# Patient Record
Sex: Female | Born: 1935 | Race: Black or African American | Hispanic: No | Marital: Married | Smoking: Never smoker
Health system: Southern US, Community
[De-identification: ages and names within clinical notes are randomized; demographics above are authoritative.]

## PROBLEM LIST (undated history)

## (undated) DIAGNOSIS — M1990 Unspecified osteoarthritis, unspecified site: Secondary | ICD-10-CM

## (undated) DIAGNOSIS — R0600 Dyspnea, unspecified: Secondary | ICD-10-CM

## (undated) DIAGNOSIS — R062 Wheezing: Secondary | ICD-10-CM

## (undated) DIAGNOSIS — G4730 Sleep apnea, unspecified: Secondary | ICD-10-CM

## (undated) DIAGNOSIS — K219 Gastro-esophageal reflux disease without esophagitis: Secondary | ICD-10-CM

## (undated) DIAGNOSIS — I10 Essential (primary) hypertension: Secondary | ICD-10-CM

## (undated) DIAGNOSIS — M7989 Other specified soft tissue disorders: Secondary | ICD-10-CM

## (undated) DIAGNOSIS — E119 Type 2 diabetes mellitus without complications: Secondary | ICD-10-CM

## (undated) DIAGNOSIS — R32 Unspecified urinary incontinence: Secondary | ICD-10-CM

## (undated) HISTORY — PX: CORONARY ANGIOPLASTY: SHX604

## (undated) HISTORY — PX: FOOT SURGERY: SHX648

## (undated) HISTORY — PX: SHOULDER SURGERY: SHX246

## (undated) HISTORY — PX: JOINT REPLACEMENT: SHX530

## (undated) HISTORY — PX: OTHER SURGICAL HISTORY: SHX169

## (undated) HISTORY — PX: ANKLE SURGERY: SHX546

## (undated) HISTORY — PX: CARDIAC CATHETERIZATION: SHX172

## (undated) HISTORY — DX: Type 2 diabetes mellitus without complications: E11.9

## (undated) HISTORY — PX: KNEE SURGERY: SHX244

## (undated) HISTORY — PX: CARPAL TUNNEL RELEASE: SHX101

---

## 2004-03-30 ENCOUNTER — Ambulatory Visit: Payer: Self-pay | Admitting: Unknown Physician Specialty

## 2004-04-23 ENCOUNTER — Other Ambulatory Visit: Payer: Self-pay

## 2004-04-30 ENCOUNTER — Inpatient Hospital Stay: Payer: Self-pay | Admitting: Unknown Physician Specialty

## 2004-07-01 ENCOUNTER — Emergency Department: Payer: Self-pay | Admitting: Unknown Physician Specialty

## 2004-07-03 ENCOUNTER — Ambulatory Visit: Payer: Self-pay | Admitting: Unknown Physician Specialty

## 2005-01-02 ENCOUNTER — Other Ambulatory Visit: Payer: Self-pay

## 2005-01-08 ENCOUNTER — Inpatient Hospital Stay: Payer: Self-pay | Admitting: Unknown Physician Specialty

## 2005-01-11 ENCOUNTER — Encounter: Payer: Self-pay | Admitting: Internal Medicine

## 2005-02-10 ENCOUNTER — Encounter: Payer: Self-pay | Admitting: Internal Medicine

## 2005-03-13 ENCOUNTER — Encounter: Payer: Self-pay | Admitting: Internal Medicine

## 2005-04-12 ENCOUNTER — Encounter: Payer: Self-pay | Admitting: Internal Medicine

## 2005-05-13 ENCOUNTER — Encounter: Payer: Self-pay | Admitting: Internal Medicine

## 2005-06-13 ENCOUNTER — Encounter: Payer: Self-pay | Admitting: Internal Medicine

## 2005-07-11 ENCOUNTER — Encounter: Payer: Self-pay | Admitting: Internal Medicine

## 2005-08-11 ENCOUNTER — Encounter: Payer: Self-pay | Admitting: Internal Medicine

## 2005-09-10 ENCOUNTER — Encounter: Payer: Self-pay | Admitting: Internal Medicine

## 2005-10-11 ENCOUNTER — Encounter: Payer: Self-pay | Admitting: Internal Medicine

## 2005-11-10 ENCOUNTER — Encounter: Payer: Self-pay | Admitting: Internal Medicine

## 2005-12-03 ENCOUNTER — Ambulatory Visit: Payer: Self-pay | Admitting: Internal Medicine

## 2005-12-11 ENCOUNTER — Encounter: Payer: Self-pay | Admitting: Internal Medicine

## 2006-01-11 ENCOUNTER — Encounter: Payer: Self-pay | Admitting: Internal Medicine

## 2006-02-10 ENCOUNTER — Encounter: Payer: Self-pay | Admitting: Internal Medicine

## 2006-03-13 ENCOUNTER — Encounter: Payer: Self-pay | Admitting: Internal Medicine

## 2006-05-09 ENCOUNTER — Other Ambulatory Visit: Payer: Self-pay

## 2006-05-09 ENCOUNTER — Inpatient Hospital Stay: Payer: Self-pay | Admitting: Cardiology

## 2007-05-21 ENCOUNTER — Ambulatory Visit: Payer: Self-pay | Admitting: Internal Medicine

## 2008-07-27 ENCOUNTER — Ambulatory Visit: Payer: Self-pay | Admitting: Internal Medicine

## 2008-10-03 ENCOUNTER — Ambulatory Visit: Payer: Self-pay | Admitting: Internal Medicine

## 2009-02-08 ENCOUNTER — Ambulatory Visit: Payer: Self-pay | Admitting: Specialist

## 2009-03-14 ENCOUNTER — Ambulatory Visit: Payer: Self-pay | Admitting: Specialist

## 2009-08-01 ENCOUNTER — Ambulatory Visit: Payer: Self-pay | Admitting: Internal Medicine

## 2009-12-04 ENCOUNTER — Inpatient Hospital Stay: Payer: Self-pay | Admitting: *Deleted

## 2010-01-25 ENCOUNTER — Ambulatory Visit: Payer: Self-pay | Admitting: Gastroenterology

## 2010-08-10 ENCOUNTER — Ambulatory Visit: Payer: Self-pay | Admitting: Internal Medicine

## 2010-08-14 ENCOUNTER — Ambulatory Visit: Payer: Self-pay | Admitting: Internal Medicine

## 2011-09-03 ENCOUNTER — Ambulatory Visit: Payer: Self-pay | Admitting: Internal Medicine

## 2012-09-08 ENCOUNTER — Ambulatory Visit: Payer: Self-pay | Admitting: Internal Medicine

## 2013-04-20 ENCOUNTER — Ambulatory Visit (INDEPENDENT_AMBULATORY_CARE_PROVIDER_SITE_OTHER): Payer: Medicare (Managed Care) | Admitting: Podiatry

## 2013-04-20 ENCOUNTER — Encounter: Payer: Self-pay | Admitting: Podiatry

## 2013-04-20 VITALS — BP 202/109 | HR 106 | Resp 20 | Ht <= 58 in | Wt 165.0 lb

## 2013-04-20 DIAGNOSIS — E1159 Type 2 diabetes mellitus with other circulatory complications: Secondary | ICD-10-CM

## 2013-04-20 DIAGNOSIS — Q828 Other specified congenital malformations of skin: Secondary | ICD-10-CM

## 2013-04-20 DIAGNOSIS — M79609 Pain in unspecified limb: Secondary | ICD-10-CM

## 2013-04-20 DIAGNOSIS — M2040 Other hammer toe(s) (acquired), unspecified foot: Secondary | ICD-10-CM

## 2013-04-20 DIAGNOSIS — B351 Tinea unguium: Secondary | ICD-10-CM

## 2013-04-20 NOTE — Progress Notes (Signed)
   Subjective:    Patient ID: Sydney Schultz, female    DOB: August 15, 1931, 79 y.o.   MRN: [id number]  HPI Comments: i have a pair of diabetic shoes i need to pick up , toenails      Review of Systems     Objective:   Physical Exam        Assessment & Plan:

## 2013-04-21 NOTE — Progress Notes (Signed)
Subjective:     Patient ID: Sydney Schultz, female   DOB: 11/16/1931, 79 y.o.   MRN: [id number]  HPI patient presents with painful nailbeds 1-5 both feet that she can not cut or take care of her self   Review of Systems     Objective:   Physical Exam Neurovascular status unchanged with health history same and nail disease with thickness 1-5 of both feet    Assessment:     Mycotic nail infection with pain 1-5 both feet    Plan:     Debridement painful nail bed 1-5 both feet with no iatrogenic bleeding noted. Also we dispensed diabetic shoes do to risk factors including reduced circulatory status and long-term diabetes along with digital deformity. They fit well and should help to prevent ulceration

## 2013-07-20 ENCOUNTER — Ambulatory Visit: Payer: Medicare (Managed Care) | Admitting: Podiatry

## 2013-07-30 ENCOUNTER — Ambulatory Visit (INDEPENDENT_AMBULATORY_CARE_PROVIDER_SITE_OTHER): Payer: Medicare (Managed Care) | Admitting: Podiatry

## 2013-07-30 VITALS — BP 175/91 | HR 92 | Resp 16 | Ht <= 58 in | Wt 172.0 lb

## 2013-07-30 DIAGNOSIS — B351 Tinea unguium: Secondary | ICD-10-CM

## 2013-07-30 DIAGNOSIS — M79609 Pain in unspecified limb: Secondary | ICD-10-CM

## 2013-07-30 DIAGNOSIS — M779 Enthesopathy, unspecified: Secondary | ICD-10-CM

## 2013-07-30 NOTE — Progress Notes (Signed)
Subjective:     Patient ID: Sydney Schultz, female   DOB: 17-Apr-1932, 80 y.o.   MRN: [id number]  HPI patient states that her nails are elongated and painful when pressed and she is also concerned about the left foot continuing to flatten and displaced   Review of Systems     Objective:   Physical Exam Neurovascular status unchanged with nail disease and thickness 1-5 both feet that are painful and on the left foot I noted there is significant collapse of the medial longitudinal arch with lateral rotation of the foot with minimal discomfort but patient feels like she shuffles because of the structural position. Right is also deformed but not to the same degree    Assessment:     Nail disease with pain 1-5 both feet and significant tendon dysfunction causing continued structural changes in foot and ankle left    Plan:     Reviewed conditions and debridement nailbeds 1-5 both feet with no bleeding noted I then went ahead and discussed bracing for the left and surgery and patient is going to think about these different options and decide whether she would want to pursue one or just continue to try to walk as best as she can

## 2013-09-29 ENCOUNTER — Ambulatory Visit: Payer: Self-pay | Admitting: Internal Medicine

## 2013-10-29 ENCOUNTER — Ambulatory Visit: Payer: Medicare (Managed Care) | Admitting: Podiatry

## 2013-11-13 ENCOUNTER — Emergency Department: Payer: Self-pay | Admitting: Emergency Medicine

## 2013-11-13 LAB — CBC WITH DIFFERENTIAL/PLATELET
BASOS ABS: 0 10*3/uL (ref 0.0–0.1)
Basophil %: 0.4 %
EOS ABS: 0.2 10*3/uL (ref 0.0–0.7)
EOS PCT: 3.5 %
HCT: 36.9 % (ref 35.0–47.0)
HGB: 11.9 g/dL — ABNORMAL LOW (ref 12.0–16.0)
LYMPHS ABS: 1 10*3/uL (ref 1.0–3.6)
Lymphocyte %: 18.1 %
MCH: 29.1 pg (ref 26.0–34.0)
MCHC: 32.1 g/dL (ref 32.0–36.0)
MCV: 91 fL (ref 80–100)
Monocyte #: 0.7 x10 3/mm (ref 0.2–0.9)
Monocyte %: 12.2 %
NEUTROS PCT: 65.8 %
Neutrophil #: 3.5 10*3/uL (ref 1.4–6.5)
Platelet: 201 10*3/uL (ref 150–440)
RBC: 4.08 10*6/uL (ref 3.80–5.20)
RDW: 14.3 % (ref 11.5–14.5)
WBC: 5.4 10*3/uL (ref 3.6–11.0)

## 2013-11-13 LAB — PROTIME-INR
INR: 1
Prothrombin Time: 12.8 secs (ref 11.5–14.7)

## 2013-11-13 LAB — COMPREHENSIVE METABOLIC PANEL
ALBUMIN: 4.2 g/dL (ref 3.4–5.0)
ALT: 28 U/L (ref 12–78)
AST: 34 U/L (ref 15–37)
Alkaline Phosphatase: 84 U/L
Anion Gap: 10 (ref 7–16)
BILIRUBIN TOTAL: 0.6 mg/dL (ref 0.2–1.0)
BUN: 26 mg/dL — ABNORMAL HIGH (ref 7–18)
CALCIUM: 10.9 mg/dL — AB (ref 8.5–10.1)
CHLORIDE: 102 mmol/L (ref 98–107)
CO2: 28 mmol/L (ref 21–32)
Creatinine: 1.24 mg/dL (ref 0.60–1.30)
EGFR (African American): 48 — ABNORMAL LOW
EGFR (Non-African Amer.): 41 — ABNORMAL LOW
GLUCOSE: 144 mg/dL — AB (ref 65–99)
OSMOLALITY: 287 (ref 275–301)
Potassium: 3.2 mmol/L — ABNORMAL LOW (ref 3.5–5.1)
Sodium: 140 mmol/L (ref 136–145)
Total Protein: 7 g/dL (ref 6.4–8.2)

## 2013-11-13 LAB — URINALYSIS, COMPLETE
BLOOD: NEGATIVE
Bilirubin,UR: NEGATIVE
Glucose,UR: NEGATIVE mg/dL (ref 0–75)
Ketone: NEGATIVE
Nitrite: NEGATIVE
PH: 6 (ref 4.5–8.0)
Protein: 30
Specific Gravity: 1.014 (ref 1.003–1.030)

## 2013-11-13 LAB — TROPONIN I: Troponin-I: <0.02 ng/mL

## 2013-11-13 LAB — APTT: ACTIVATED PTT: 33.7 s (ref 23.6–35.9)

## 2013-11-15 LAB — URINE CULTURE

## 2013-11-30 ENCOUNTER — Ambulatory Visit: Payer: Self-pay | Admitting: Specialist

## 2014-02-04 ENCOUNTER — Emergency Department: Payer: Self-pay | Admitting: Emergency Medicine

## 2014-02-04 LAB — COMPREHENSIVE METABOLIC PANEL
AST: 34 U/L (ref 15–37)
Albumin: 4.2 g/dL (ref 3.4–5.0)
Alkaline Phosphatase: 86 U/L
Anion Gap: 4 — ABNORMAL LOW (ref 7–16)
BILIRUBIN TOTAL: 0.6 mg/dL (ref 0.2–1.0)
BUN: 22 mg/dL — AB (ref 7–18)
CHLORIDE: 102 mmol/L (ref 98–107)
CO2: 33 mmol/L — AB (ref 21–32)
Calcium, Total: 10.6 mg/dL — ABNORMAL HIGH (ref 8.5–10.1)
Creatinine: 1.11 mg/dL (ref 0.60–1.30)
GFR CALC NON AF AMER: 50 — AB
Glucose: 101 mg/dL — ABNORMAL HIGH (ref 65–99)
Osmolality: 281 (ref 275–301)
Potassium: 3.6 mmol/L (ref 3.5–5.1)
SGPT (ALT): 31 U/L
SODIUM: 139 mmol/L (ref 136–145)
Total Protein: 7.8 g/dL (ref 6.4–8.2)

## 2014-02-04 LAB — CBC WITH DIFFERENTIAL/PLATELET
BASOS ABS: 0 10*3/uL (ref 0.0–0.1)
Basophil %: 0.9 %
Eosinophil #: 0.3 10*3/uL (ref 0.0–0.7)
Eosinophil %: 7.1 %
HCT: 37.9 % (ref 35.0–47.0)
HGB: 12.5 g/dL (ref 12.0–16.0)
Lymphocyte #: 1.1 10*3/uL (ref 1.0–3.6)
Lymphocyte %: 28.1 %
MCH: 29.6 pg (ref 26.0–34.0)
MCHC: 32.9 g/dL (ref 32.0–36.0)
MCV: 90 fL (ref 80–100)
Monocyte #: 0.6 x10 3/mm (ref 0.2–0.9)
Monocyte %: 14.9 %
Neutrophil #: 1.9 10*3/uL (ref 1.4–6.5)
Neutrophil %: 49 %
PLATELETS: 214 10*3/uL (ref 150–440)
RBC: 4.21 10*6/uL (ref 3.80–5.20)
RDW: 13.8 % (ref 11.5–14.5)
WBC: 3.9 10*3/uL (ref 3.6–11.0)

## 2014-02-04 LAB — URINALYSIS, COMPLETE
BLOOD: NEGATIVE
Bacteria: NONE SEEN
Bilirubin,UR: NEGATIVE
Glucose,UR: NEGATIVE mg/dL (ref 0–75)
KETONE: NEGATIVE
Leukocyte Esterase: NEGATIVE
Nitrite: NEGATIVE
PH: 7 (ref 4.5–8.0)
Protein: NEGATIVE
SQUAMOUS EPITHELIAL: NONE SEEN
Specific Gravity: 1.005 (ref 1.003–1.030)
WBC UR: <1 /HPF (ref 0–5)

## 2014-02-04 LAB — TROPONIN I: Troponin-I: <0.02 ng/mL

## 2014-04-22 ENCOUNTER — Ambulatory Visit (INDEPENDENT_AMBULATORY_CARE_PROVIDER_SITE_OTHER): Payer: Medicare (Managed Care) | Admitting: Podiatry

## 2014-04-22 ENCOUNTER — Ambulatory Visit: Payer: Medicare (Managed Care) | Admitting: Podiatry

## 2014-04-22 DIAGNOSIS — M79673 Pain in unspecified foot: Secondary | ICD-10-CM

## 2014-04-22 DIAGNOSIS — B351 Tinea unguium: Secondary | ICD-10-CM

## 2014-04-22 DIAGNOSIS — M779 Enthesopathy, unspecified: Secondary | ICD-10-CM

## 2014-04-22 NOTE — Progress Notes (Signed)
Subjective:     Patient ID: Sydney Schultz, female   DOB: December 06, 1931, 80 y.o.   MRN: [id number]  HPI patient states that her nails are elongated and painful when pressed and she is also concerned about the left foot continuing to flatten and displaced   Review of Systems     Objective:   Physical Exam Neurovascular status unchanged with nail disease and thickness 1-5 both feet that are painful and on the left foot I noted there is significant collapse of the medial longitudinal arch with lateral rotation of the foot with minimal discomfort but patient feels like she shuffles because of the structural position. Right is also deformed but not to the same degree    Assessment:     Nail disease with pain 1-5 both feet and significant tendon dysfunction causing continued structural changes in foot and ankle left    Plan:     Reviewed conditions and debridement nailbeds 1-5 both feet with no bleeding noted I then went ahead and discussed bracing for the left and surgery and patient is going to think about these different options and decide whether she would want to pursue one or just continue to try to walk as best as she can

## 2014-07-22 ENCOUNTER — Ambulatory Visit: Payer: Medicare (Managed Care) | Admitting: Podiatry

## 2015-03-02 ENCOUNTER — Ambulatory Visit
Admission: RE | Admit: 2015-03-02 | Discharge: 2015-03-02 | Disposition: A | Discharge status: Home / Self Care | Payer: Medicare Other | Source: Ambulatory Visit | Attending: Internal Medicine | Admitting: Internal Medicine

## 2015-03-02 ENCOUNTER — Other Ambulatory Visit: Payer: Self-pay | Admitting: Internal Medicine

## 2015-03-02 DIAGNOSIS — R0600 Dyspnea, unspecified: Secondary | ICD-10-CM | POA: Diagnosis present

## 2015-03-02 DIAGNOSIS — J811 Chronic pulmonary edema: Secondary | ICD-10-CM | POA: Insufficient documentation

## 2015-07-31 ENCOUNTER — Encounter: Payer: Self-pay | Admitting: Emergency Medicine

## 2015-07-31 ENCOUNTER — Emergency Department: Payer: Medicare Other

## 2015-07-31 ENCOUNTER — Emergency Department
Admission: EM | Admit: 2015-07-31 | Discharge: 2015-07-31 | Disposition: A | Discharge status: Home / Self Care | Payer: Medicare Other | Attending: Emergency Medicine | Admitting: Emergency Medicine

## 2015-07-31 DIAGNOSIS — E119 Type 2 diabetes mellitus without complications: Secondary | ICD-10-CM | POA: Diagnosis not present

## 2015-07-31 DIAGNOSIS — Y9389 Activity, other specified: Secondary | ICD-10-CM | POA: Diagnosis not present

## 2015-07-31 DIAGNOSIS — W1839XA Other fall on same level, initial encounter: Secondary | ICD-10-CM | POA: Diagnosis not present

## 2015-07-31 DIAGNOSIS — S0003XA Contusion of scalp, initial encounter: Secondary | ICD-10-CM | POA: Diagnosis not present

## 2015-07-31 DIAGNOSIS — I10 Essential (primary) hypertension: Secondary | ICD-10-CM | POA: Insufficient documentation

## 2015-07-31 DIAGNOSIS — S0990XA Unspecified injury of head, initial encounter: Secondary | ICD-10-CM

## 2015-07-31 DIAGNOSIS — Y998 Other external cause status: Secondary | ICD-10-CM | POA: Diagnosis not present

## 2015-07-31 DIAGNOSIS — W19XXXA Unspecified fall, initial encounter: Secondary | ICD-10-CM

## 2015-07-31 DIAGNOSIS — Y9289 Other specified places as the place of occurrence of the external cause: Secondary | ICD-10-CM | POA: Insufficient documentation

## 2015-07-31 HISTORY — DX: Essential (primary) hypertension: I10

## 2015-07-31 NOTE — ED Notes (Signed)
Patient transported to CT 

## 2015-07-31 NOTE — ED Provider Notes (Addendum)
[facility] Emergency Department Provider Note  ____________________________________________   I have reviewed the triage vital signs and the nursing notes.   HISTORY  Chief Complaint Head Injury    HPI Sydney Schultz is a 82 y.o. female on Plavix. She states that she was walking around a table when she lost her footing and fell. This is a non-syncopal fall. Patient did not pass out. She is at her baseline, she has no other complaints. She states she has no significant headache. She does have a hematoma to the occiput. No neck pain no focal numbness or weakness and no chest pain shortness of breath dysuria or urinary frequency or other antecedent symptoms.  Past Medical History  Diagnosis Date  . Diabetes (HCC)   . Hypertension     There are no active problems to display for this patient.   Past Surgical History  Procedure Laterality Date  . Knee surgery Bilateral   . Shoulder surgery Right   . Cesarean section      x 2  . Foot surgery Left     Current Outpatient Rx  Name  Route  Sig  Dispense  Refill  . clopidogrel (PLAVIX) 75 MG tablet               . furosemide (LASIX) 40 MG tablet               . isosorbide mononitrate (IMDUR) 60 MG 24 hr tablet               . losartan-hydrochlorothiazide (HYZAAR) 100-25 MG per tablet               . meloxicam (MOBIC) 7.5 MG tablet               . metFORMIN (GLUCOPHAGE) 500 MG tablet               . pantoprazole (PROTONIX) 40 MG tablet               . VYTORIN 10-40 MG per tablet                 Allergies Review of patient's allergies indicates no known allergies.  History reviewed. No pertinent family history.  Social History Social History  Substance Use Topics  . Smoking status: Never Smoker   . Smokeless tobacco: Never Used  . Alcohol Use: No    Review of Systems Constitutional: No fever/chills Eyes: No visual changes. ENT: No sore throat. No stiff  neck no neck pain Cardiovascular: Denies chest pain. Respiratory: Denies shortness of breath. Gastrointestinal:   no vomiting.  No diarrhea.  No constipation. Genitourinary: Negative for dysuria. Musculoskeletal: Negative lower extremity swelling Skin: Negative for rash. Neurological: Negative for headaches, focal weakness or numbness. 10-point ROS otherwise negative.  ____________________________________________   PHYSICAL EXAM:  VITAL SIGNS: ED Triage Vitals  Enc Vitals Group     BP --      Pulse Rate 07/31/15 0251 71     Resp 07/31/15 0251 16     Temp 07/31/15 0251 98 F (36.7 C)     Temp Source 07/31/15 0251 Oral     SpO2 07/31/15 0251 100 %     Weight 07/31/15 0251 177 lb 2 oz (80.343 kg)     Height 07/31/15 0251 4\' 9"  (1.448 m)     Head Cir --      Peak Flow --      Pain Score 07/31/15 0254 5  Pain Loc --      Pain Edu? --      Excl. in GC? --     Constitutional: Alert and oriented. Well appearing and in no acute distress. Smiling and joking with me. States "the only thing that happened as I hit my big head" Eyes: Conjunctivae are normal. PERRL. EOMI. Head: Mild hematoma noted to the occipital region, no skull fracture palpated. Nose: No congestion/rhinnorhea. Mouth/Throat: Mucous membranes are moist.  Oropharynx non-erythematous. Neck: No stridor.   Nontender with no meningismus Cardiovascular: Normal rate, regular rhythm. Grossly normal heart sounds.  Good peripheral circulation. Respiratory: Normal respiratory effort.  No retractions. Lungs CTAB. Abdominal: Soft and nontender. No distention. No guarding no rebound Back:  There is no focal tenderness or step off there is no midline tenderness there are no lesions noted. there is no CVA tenderness Musculoskeletal: No lower extremity tenderness. No joint effusions, no DVT signs strong distal pulses no edema Neurologic:  Normal speech and language. No gross focal neurologic deficits are appreciated.  Skin:  Skin  is warm, dry and intact. No rash noted. Psychiatric: Mood and affect are normal. Speech and behavior are normal.  ____________________________________________   LABS (all labs ordered are listed, but only abnormal results are displayed)  Labs Reviewed - No data to display ____________________________________________  EKG  I personally interpreted any EKGs ordered by me or triage  ____________________________________________  RADIOLOGY  I reviewed any imaging ordered by me or triage that were performed during my shift and, if possible, patient and/or family made aware of any abnormal findings. ____________________________________________   PROCEDURES  Procedure(s) performed: None  Critical Care performed: None  ____________________________________________   INITIAL IMPRESSION / ASSESSMENT AND PLAN / ED COURSE  Pertinent labs & imaging results that were available during my care of the patient were reviewed by me and considered in my medical decision making (see chart for details).  Non-syncopal fall, as patient is on Plavix we'll obtain a CT scan of her head she is neurologically intact  ----------------------------------------- 5:22 AM on 07/31/2015 -----------------------------------------  Remains at her baseline no distress with no evidence of concussion or decompensation CT is negative, no back pain no hip pain, this was a remembered non-syncopal fall ____________________________________________   FINAL CLINICAL IMPRESSION(S) / ED DIAGNOSES  Final diagnoses:  None      This chart was dictated using voice recognition software.  Despite best efforts to proofread,  errors can occur which can change meaning.     Jeanmarie Plant, MD 07/31/15 [id number]  Jeanmarie Plant, MD 07/31/15 [phone]

## 2015-07-31 NOTE — ED Notes (Signed)
Pt denies needs at this time. Call bell at right side, warm blankets provided.

## 2015-07-31 NOTE — ED Notes (Signed)
Pt updated on progress of ct results.

## 2015-07-31 NOTE — ED Notes (Signed)
Pt states she lost her balance falling backward, striking posterior skull. Pt with hematoma noted to posterior skull. Cms intact in all extremities. Pt denies loc.

## 2015-11-29 ENCOUNTER — Emergency Department: Payer: Medicare Other

## 2015-11-29 ENCOUNTER — Emergency Department
Admission: EM | Admit: 2015-11-29 | Discharge: 2015-11-29 | Disposition: A | Discharge status: Home / Self Care | Payer: Medicare Other | Attending: Emergency Medicine | Admitting: Emergency Medicine

## 2015-11-29 DIAGNOSIS — Z7984 Long term (current) use of oral hypoglycemic drugs: Secondary | ICD-10-CM | POA: Insufficient documentation

## 2015-11-29 DIAGNOSIS — Z7982 Long term (current) use of aspirin: Secondary | ICD-10-CM | POA: Diagnosis not present

## 2015-11-29 DIAGNOSIS — R42 Dizziness and giddiness: Secondary | ICD-10-CM | POA: Diagnosis present

## 2015-11-29 DIAGNOSIS — E119 Type 2 diabetes mellitus without complications: Secondary | ICD-10-CM | POA: Insufficient documentation

## 2015-11-29 DIAGNOSIS — I10 Essential (primary) hypertension: Secondary | ICD-10-CM | POA: Diagnosis not present

## 2015-11-29 DIAGNOSIS — R531 Weakness: Secondary | ICD-10-CM

## 2015-11-29 DIAGNOSIS — Z79899 Other long term (current) drug therapy: Secondary | ICD-10-CM | POA: Diagnosis not present

## 2015-11-29 DIAGNOSIS — E860 Dehydration: Secondary | ICD-10-CM

## 2015-11-29 LAB — URINALYSIS COMPLETE WITH MICROSCOPIC (ARMC ONLY)
Bilirubin Urine: NEGATIVE
Glucose, UA: NEGATIVE mg/dL
Hgb urine dipstick: NEGATIVE
Ketones, ur: NEGATIVE mg/dL
Nitrite: NEGATIVE
PH: 6 (ref 5.0–8.0)
PROTEIN: 30 mg/dL — AB
Specific Gravity, Urine: 1.015 (ref 1.005–1.030)

## 2015-11-29 LAB — CBC
HCT: 34.9 % — ABNORMAL LOW (ref 35.0–47.0)
Hemoglobin: 11.9 g/dL — ABNORMAL LOW (ref 12.0–16.0)
MCH: 29.4 pg (ref 26.0–34.0)
MCHC: 34.1 g/dL (ref 32.0–36.0)
MCV: 86.3 fL (ref 80.0–100.0)
PLATELETS: 183 10*3/uL (ref 150–440)
RBC: 4.05 MIL/uL (ref 3.80–5.20)
RDW: 14.2 % (ref 11.5–14.5)
WBC: 5.5 10*3/uL (ref 3.6–11.0)

## 2015-11-29 LAB — COMPREHENSIVE METABOLIC PANEL
ALBUMIN: 3.9 g/dL (ref 3.5–5.0)
ALT: 12 U/L — ABNORMAL LOW (ref 14–54)
AST: 28 U/L (ref 15–41)
Alkaline Phosphatase: 67 U/L (ref 38–126)
Anion gap: 8 (ref 5–15)
BUN: 24 mg/dL — AB (ref 6–20)
CALCIUM: 10.2 mg/dL (ref 8.9–10.3)
CO2: 30 mmol/L (ref 22–32)
CREATININE: 1.5 mg/dL — AB (ref 0.44–1.00)
Chloride: 97 mmol/L — ABNORMAL LOW (ref 101–111)
GFR calc Af Amer: 36 mL/min — ABNORMAL LOW (ref >60)
GFR calc non Af Amer: 31 mL/min — ABNORMAL LOW (ref >60)
GLUCOSE: 147 mg/dL — AB (ref 65–99)
Potassium: 3.1 mmol/L — ABNORMAL LOW (ref 3.5–5.1)
SODIUM: 135 mmol/L (ref 135–145)
Total Bilirubin: 0.5 mg/dL (ref 0.3–1.2)
Total Protein: 6.7 g/dL (ref 6.5–8.1)

## 2015-11-29 LAB — GLUCOSE, CAPILLARY: GLUCOSE-CAPILLARY: 157 mg/dL — AB (ref 65–99)

## 2015-11-29 LAB — TROPONIN I: Troponin I: <0.03 ng/mL (ref <0.03)

## 2015-11-29 MED ORDER — POTASSIUM CHLORIDE 20 MEQ PO PACK: 40.0000 meq | PACK | Freq: Two times a day (BID) | ORAL | Status: DC

## 2015-11-29 MED ORDER — POTASSIUM CHLORIDE 20 MEQ PO PACK
40.0000 meq | PACK | Freq: Once | ORAL | Status: AC
  Administered 2015-11-29: 40 meq via ORAL
  Filled 2015-11-29: qty 2

## 2015-11-29 NOTE — ED Notes (Signed)
MD at bedside. 

## 2015-11-29 NOTE — ED Notes (Signed)
Pt transported to xray via stretcher

## 2015-11-29 NOTE — ED Notes (Signed)
Pt presents to ED via [facility]. EMS got call for sudden onset of weakness and lethargy. Stated that pt was in a chair saying "I'm sick". Initial BP with EMS 73/40, gave normal saline, BP up to 146/72. EMS reports that pt is much more alert and less lethargic upon arrival to ED. Pt awake and alert.

## 2015-11-29 NOTE — ED Provider Notes (Signed)
[facility] Emergency Department Provider Note   ____________________________________________  Time seen: Approximately 2:19 AM  I have reviewed the triage vital signs and the nursing notes.   HISTORY  Chief Complaint Hypotension and Fatigue    HPI Sydney Schultz is a 82 y.o. female who is brought in by ambulance for low blood pressure and weakness. The patient reports that things weren't going right tonight with her breathing. She reports that her breathing was not right but cannot explain exactly what was going on. The patient denies any chest pain denies dizziness denies abdominal pain denies vomiting. She reports that she is feeling weak all over. It started right before she called EMS. According to EMS she had some sudden onset weakness and lethargy. When EMS checked her blood pressure they report that was 73/40. She was given 250 ML's of normal saline and her blood pressure came up to 146/22. The patient says she has been eating and drinking well and has had no problems with urination. The patient reports that she was just not feeling well so she is here for evaluation.   Past Medical History  Diagnosis Date  . Diabetes (HCC)   . Hypertension     There are no active problems to display for this patient.   Past Surgical History  Procedure Laterality Date  . Knee surgery Bilateral   . Shoulder surgery Right   . Cesarean section      x 2  . Foot surgery Left     Current Outpatient Rx  Name  Route  Sig  Dispense  Refill  . aspirin EC 81 MG tablet   Oral   Take 81 mg by mouth daily.         . calcium-vitamin D (OSCAL WITH D) 500-200 MG-UNIT tablet   Oral   Take 1 tablet by mouth daily with breakfast.         . clopidogrel (PLAVIX) 75 MG tablet   Oral   Take 75 mg by mouth daily.          . ferrous sulfate 325 (65 FE) MG tablet   Oral   Take 325 mg by mouth daily with breakfast.         . furosemide (LASIX) 40 MG tablet  Oral   Take 20-40 mg by mouth daily as needed for edema.          Marland Kitchen losartan-hydrochlorothiazide (HYZAAR) 100-25 MG per tablet   Oral   Take 1 tablet by mouth daily.          . meloxicam (MOBIC) 7.5 MG tablet   Oral   Take 7.5 mg by mouth daily.          . metFORMIN (GLUCOPHAGE) 500 MG tablet   Oral   Take 750 mg by mouth daily with breakfast.          . oxybutynin (DITROPAN) 5 MG tablet   Oral   Take 5 mg by mouth 2 (two) times daily.         . pantoprazole (PROTONIX) 40 MG tablet   Oral   Take 40 mg by mouth daily.         . Potassium Gluconate 550 MG TABS   Oral   Take 1 tablet by mouth daily.         . vitamin E 400 UNIT capsule   Oral   Take 400 Units by mouth daily.         Marland Kitchen  VYTORIN 10-40 MG per tablet   Oral   Take 1 tablet by mouth daily at 6 PM.            Allergies Review of patient's allergies indicates no known allergies.  History reviewed. No pertinent family history.  Social History Social History  Substance Use Topics  . Smoking status: Never Smoker   . Smokeless tobacco: Never Used  . Alcohol Use: No    Review of Systems Constitutional: No fever/chills Eyes: No visual changes. ENT: No sore throat. Cardiovascular: Denies chest pain. Respiratory: Difficulty breathing Gastrointestinal: No abdominal pain.  No nausea, no vomiting.  No diarrhea.  No constipation. Genitourinary: Negative for dysuria. Musculoskeletal: Negative for back pain. Skin: Negative for rash. Neurological: Denies weakness  10-point ROS otherwise negative.  ____________________________________________   PHYSICAL EXAM:  VITAL SIGNS: ED Triage Vitals  Enc Vitals Group     BP 11/29/15 0217 126/99 mmHg     Pulse Rate 11/29/15 0217 74     Resp 11/29/15 0217 14     Temp 11/29/15 0217 98.5 F (36.9 C)     Temp src --      SpO2 11/29/15 0217 99 %     Weight --      Height --      Head Cir --      Peak Flow --      Pain Score 11/29/15 0219 0      Pain Loc --      Pain Edu? --      Excl. in GC? --     Constitutional: Alert and oriented. Well appearing and in no acute distress. Eyes: Conjunctivae are normal. PERRL. EOMI. Head: Atraumatic. Nose: No congestion/rhinnorhea. Mouth/Throat: Mucous membranes are moist.  Oropharynx non-erythematous. Cardiovascular: Normal rate, regular rhythm. Grossly normal heart sounds.  Good peripheral circulation. Respiratory: Normal respiratory effort.  No retractions. Lungs CTAB. Gastrointestinal: Soft and nontender. No distention. Positive bowel sounds Musculoskeletal: No lower extremity tenderness nor edema.   Neurologic:  Normal speech and language.  Illness 2 through 12 are grossly intact with no focal motor or neuro deficits Skin:  Skin is warm, dry and intact.  Psychiatric: Mood and affect are normal.   ____________________________________________   LABS (all labs ordered are listed, but only abnormal results are displayed)  Labs Reviewed  GLUCOSE, CAPILLARY - Abnormal; Notable for the following:    Glucose-Capillary 157 (*)    All other components within normal limits  CBC - Abnormal; Notable for the following:    Hemoglobin 11.9 (*)    HCT 34.9 (*)    All other components within normal limits  COMPREHENSIVE METABOLIC PANEL - Abnormal; Notable for the following:    Potassium 3.1 (*)    Chloride 97 (*)    Glucose, Bld 147 (*)    BUN 24 (*)    Creatinine, Ser 1.50 (*)    ALT 12 (*)    GFR calc non Af Amer 31 (*)    GFR calc Af Amer 36 (*)    All other components within normal limits  URINALYSIS COMPLETEWITH MICROSCOPIC ([facility] ONLY) - Abnormal; Notable for the following:    Color, Urine YELLOW (*)    APPearance CLEAR (*)    Protein, ur 30 (*)    Leukocytes, UA 2+ (*)    Bacteria, UA RARE (*)    Squamous Epithelial / LPF 0-5 (*)    All other components within normal limits  TROPONIN I   ____________________________________________  EKG  ED  ECG REPORT I, Rebecka ApleyWebster,   Son Barkan P, the attending physician, personally viewed and interpreted this ECG.   Date: 11/29/2015  EKG Time: 2188  Rate: 82  Rhythm: normal sinus rhythm  Axis: Normal  Intervals:none  ST&T Change: normal  ____________________________________________  RADIOLOGY  CT head: No acute finding or change, Moderate chronic microvascular disease  CXR: No active cardiopulmonary disease ____________________________________________   PROCEDURES  Procedure(s) performed: None  Procedures  Critical Care performed: No  ____________________________________________   INITIAL IMPRESSION / ASSESSMENT AND PLAN / ED COURSE  Pertinent labs & imaging results that were available during my care of the patient were reviewed by me and considered in my medical decision making (see chart for details).  This is an 82 year old female who is here in the emergency department tonight as she was feeling weak and having some difficulty breathing. Per EMS the patient's blood pressure was low but improved with 250 ML's of normal saline. I will check some blood work on the patient as well as orthostatics and reassess the patient.  I went back to reassess the patient. Family was in the room at the time. They report that the patient was sweaty and a little bit shaky and she was not speaking very clearly when she started feeling her symptoms. The patient's daughter had given her some orange juice that she was unsure of her blood sugar was low. The patient's son-in-law reports that she typically has low blood sugar or dehydration whenever she's had these episodes. I informed him that while her blood sugar was normal it could've been low-income cause her symptoms. The patient reports that she feels well right now and she is laughing and joking with the family. The patient was not orthostatic. I encouraged her to drink as well as to watch her blood sugars and the patient should follow-up with her primary care physician. The  family's questions were answered at this time they had no further complaints or concerns. The patient will be discharged home. ____________________________________________   FINAL CLINICAL IMPRESSION(S) / ED DIAGNOSES  Final diagnoses:  Weakness  Dehydration  Dizziness      NEW MEDICATIONS STARTED DURING THIS VISIT:  New Prescriptions   No medications on file     Note:  This document was prepared using Dragon voice recognition software and may include unintentional dictation errors.    Rebecka ApleyAllison P Nazaire Cordial, MD 11/29/15 [phone]

## 2015-11-29 NOTE — Discharge Instructions (Signed)
Dehydration, Adult Dehydration is a condition in which you do not have enough fluid or water in your body. It happens when you take in less fluid than you lose. Vital organs such as the kidneys, brain, and heart cannot function without a proper amount of fluids. Any loss of fluids from the body can cause dehydration.  Dehydration can range from mild to severe. This condition should be treated right away to help prevent it from becoming severe. CAUSES  This condition may be caused by:  Vomiting.  Diarrhea.  Excessive sweating, such as when exercising in hot or humid weather.  Not drinking enough fluid during strenuous exercise or during an illness.  Excessive urine output.  Fever.  Certain medicines. RISK FACTORS This condition is more likely to develop in:  People who are taking certain medicines that cause the body to lose excess fluid (diuretics).   People who have a chronic illness, such as diabetes, that may increase urination.  Older adults.   People who live at high altitudes.   People who participate in endurance sports.  SYMPTOMS  Mild Dehydration  Thirst.  Dry lips.  Slightly dry mouth.  Dry, warm skin. Moderate Dehydration  Very dry mouth.   Muscle cramps.   Dark urine and decreased urine production.   Decreased tear production.   Headache.   Light-headedness, especially when you stand up from a sitting position.  Severe Dehydration  Changes in skin.   Cold and clammy skin.   Skin does not spring back quickly when lightly pinched and released.   Changes in body fluids.   Extreme thirst.   No tears.   Not able to sweat when body temperature is high, such as in hot weather.   Minimal urine production.   Changes in vital signs.   Rapid, weak pulse (more than 100 beats per minute when you are sitting still).   Rapid breathing.   Low blood pressure.   Other changes.   Sunken eyes.   Cold hands and feet.    Confusion.  Lethargy and difficulty being awakened.  Fainting (syncope).   Short-term weight loss.   Unconsciousness. DIAGNOSIS  This condition may be diagnosed based on your symptoms. You may also have tests to determine how severe your dehydration is. These tests may include:   Urine tests.   Blood tests.  TREATMENT  Treatment for this condition depends on the severity. Mild or moderate dehydration can often be treated at home. Treatment should be started right away. Do not wait until dehydration becomes severe. Severe dehydration needs to be treated at the hospital. Treatment for Mild Dehydration  Drinking plenty of water to replace the fluid you have lost.   Replacing minerals in your blood (electrolytes) that you may have lost.  Treatment for Moderate Dehydration  Consuming oral rehydration solution (ORS). Treatment for Severe Dehydration  Receiving fluid through an IV tube.   Receiving electrolyte solution through a feeding tube that is passed through your nose and into your stomach (nasogastric tube or NG tube).  Correcting any abnormalities in electrolytes. HOME CARE INSTRUCTIONS   Drink enough fluid to keep your urine clear or pale yellow.   Drink water or fluid slowly by taking small sips. You can also try sucking on ice cubes.  Have food or beverages that contain electrolytes. Examples include bananas and sports drinks.  Take over-the-counter and prescription medicines only as told by your health care provider.   Prepare ORS according to the manufacturer's instructions. Take sips  of ORS every 5 minutes until your urine returns to normal.  If you have vomiting or diarrhea, continue to try to drink water, ORS, or both.   If you have diarrhea, avoid:   Beverages that contain caffeine.   Fruit juice.   Milk.   Carbonated soft drinks.  Do not take salt tablets. This can lead to the condition of having too much sodium in your body  (hypernatremia).  SEEK MEDICAL CARE IF:  You cannot eat or drink without vomiting.  You have had moderate diarrhea during a period of more than 24 hours.  You have a fever. SEEK IMMEDIATE MEDICAL CARE IF:   You have extreme thirst.  You have severe diarrhea.  You have not urinated in 6-8 hours, or you have urinated only a small amount of very dark urine.  You have shriveled skin.  You are dizzy, confused, or both.   This information is not intended to replace advice given to you by your health care provider. Make sure you discuss any questions you have with your health care provider.   Document Released: 04/29/2005 Document Revised: 01/18/2015 Document Reviewed: 09/14/2014 Elsevier Interactive Patient Education 2016 [employer]  Weakness Weakness is a lack of strength. It may be felt all over the body (generalized) or in one specific part of the body (focal). Some causes of weakness can be serious. You may need further medical evaluation, especially if you are elderly or you have a history of immunosuppression (such as chemotherapy or HIV), kidney disease, heart disease, or diabetes. CAUSES  Weakness can be caused by many different things, including:  Infection.  Physical exhaustion.  Internal bleeding or other blood loss that results in a lack of red blood cells (anemia).  Dehydration. This cause is more common in elderly people.  Side effects or electrolyte abnormalities from medicines, such as pain medicines or sedatives.  Emotional distress, anxiety, or depression.  Circulation problems, especially severe peripheral arterial disease.  Heart disease, such as rapid atrial fibrillation, bradycardia, or heart failure.  Nervous system disorders, such as Guillain-Barr syndrome, multiple sclerosis, or stroke. DIAGNOSIS  To find the cause of your weakness, your caregiver will take your history and perform a physical exam. Lab tests or X-rays may also be ordered, if  needed. TREATMENT  Treatment of weakness depends on the cause of your symptoms and can vary greatly. HOME CARE INSTRUCTIONS   Rest as needed.  Eat a well-balanced diet.  Try to get some exercise every day.  Only take over-the-counter or prescription medicines as directed by your caregiver. SEEK MEDICAL CARE IF:   Your weakness seems to be getting worse or spreads to other parts of your body.  You develop new aches or pains. SEEK IMMEDIATE MEDICAL CARE IF:   You cannot perform your normal daily activities, such as getting dressed and feeding yourself.  You cannot walk up and down stairs, or you feel exhausted when you do so.  You have shortness of breath or chest pain.  You have difficulty moving parts of your body.  You have weakness in only one area of the body or on only one side of the body.  You have a fever.  You have trouble speaking or swallowing.  You cannot control your bladder or bowel movements.  You have black or bloody vomit or stools. MAKE SURE YOU:  Understand these instructions.  Will watch your condition.  Will get help right away if you are not doing well or get worse.  This information is not intended to replace advice given to you by your health care provider. Make sure you discuss any questions you have with your health care provider.   Document Released: 04/29/2005 Document Revised: 10/29/2011 Document Reviewed: 06/28/2011 Elsevier Interactive Patient Education 2016 [employer]  Rehydration, Elderly Rehydration is the replacement of body fluids lost during dehydration. Dehydration is an extreme loss of body fluids to the point of body function impairment. There are many ways extreme fluid loss can occur, including vomiting, diarrhea, or excess sweating. Recovering from dehydration requires replacing lost fluids, continuing to eat to maintain strength, and avoiding foods and beverages that may contribute to further fluid loss or may increase  nausea. It is especially important for older adults to stay hydrated because dehydration can lead to dizziness and falls. Some factors that can contribute to dehydration are more common in the elderly, including the use of prescription medicines and a decreased sensation of thirst.  HOW TO REHYDRATE In most cases, rehydration involves the replacement of not only fluids but also carbohydrates and basic body salts. Rehydration with an oral rehydration solution is one way to replace essential nutrients lost through dehydration. An oral rehydration solution can be purchased at pharmacies, retail stores, and online. Premixed packets of powder that you combine with water to make a solution are also sold. You can prepare an oral rehydration solution at home by mixing the following ingredients together:    - tsp table salt.   tsp baking soda.   tsp salt substitute containing potassium chloride.  1 tablespoons sugar.  1 L (34 oz) of water. Be sure to use exact measurements. Including too much sugar can make diarrhea worse. Drink -1 cup (120-240 mL) of oral rehydration solution each time you have diarrhea or vomit. If drinking this amount makes your vomiting worse, try drinking smaller amounts more often. For example, drink 1-3 tsp every 5-10 minutes.  A general rule for staying hydrated is to drink 1-2 L of fluid per day. Talk to your caregiver about the specific amount you should be drinking each day. Drink enough fluids to keep your urine clear or pale yellow. EATING WHEN DEHYDRATED  Even if you have had severe sweating or you are having diarrhea, do not stop eating. Many healthy items in a normal diet are okay to continue eating while recovering from dehydration. The following tips can help you to lessen nausea when you eat:  Ask someone else to prepare your food. Cooking smells may worsen nausea.  Eat in a well-ventilated room away from cooking smells.  Sit up when you eat. Avoid lying down  until 1-2 hours after eating.  Eat small amounts when you eat.  Eat foods that are easy to digest. These include soft, well-cooked, or mashed foods. FOODS AND BEVERAGES TO AVOID Avoid eating or drinking the following foods and beverages that may increase nausea or further loss of fluid:   Fruit juices with a high sugar content, such as concentrated juices.  Alcohol.  Beverages containing caffeine.  Carbonated drinks. They may cause a lot of gas.  Foods that may cause a lot of gas, such as cabbage, broccoli, and beans.  Fatty, greasy, and fried foods.  Spicy, very salty, and very sweet foods or drinks.  Foods or drinks that are very hot or very cold. Consume food or drinks at or near room temperature.  Foods that need a lot of chewing, such as raw vegetables.  Foods that are sticky or hard to  swallow, such as peanut butter.   This information is not intended to replace advice given to you by your health care provider. Make sure you discuss any questions you have with your health care provider.   Document Released: 07/22/2011 Document Revised: 01/22/2012 Document Reviewed: 07/22/2011 Elsevier Interactive Patient Education [employer]

## 2016-05-23 ENCOUNTER — Other Ambulatory Visit: Payer: Self-pay | Admitting: Gastroenterology

## 2016-05-23 DIAGNOSIS — R197 Diarrhea, unspecified: Secondary | ICD-10-CM

## 2016-06-18 ENCOUNTER — Ambulatory Visit
Admission: RE | Admit: 2016-06-18 | Discharge: 2016-06-18 | Disposition: A | Discharge status: Home / Self Care | Payer: Medicare Other | Source: Ambulatory Visit | Attending: Gastroenterology | Admitting: Gastroenterology

## 2016-06-18 DIAGNOSIS — I348 Other nonrheumatic mitral valve disorders: Secondary | ICD-10-CM | POA: Diagnosis not present

## 2016-06-18 DIAGNOSIS — M47896 Other spondylosis, lumbar region: Secondary | ICD-10-CM | POA: Diagnosis not present

## 2016-06-18 DIAGNOSIS — K8020 Calculus of gallbladder without cholecystitis without obstruction: Secondary | ICD-10-CM | POA: Diagnosis not present

## 2016-06-18 DIAGNOSIS — R197 Diarrhea, unspecified: Secondary | ICD-10-CM | POA: Insufficient documentation

## 2016-06-18 MED ORDER — IOPAMIDOL (ISOVUE-300) INJECTION 61%
100.0000 mL | Freq: Once | INTRAVENOUS | Status: AC | PRN
  Administered 2016-06-18: 100 mL via INTRAVENOUS

## 2016-11-23 ENCOUNTER — Emergency Department: Payer: Medicare Other

## 2016-11-23 ENCOUNTER — Emergency Department
Admission: EM | Admit: 2016-11-23 | Discharge: 2016-11-24 | Disposition: A | Discharge status: Home / Self Care | Payer: Medicare Other | Attending: Emergency Medicine | Admitting: Emergency Medicine

## 2016-11-23 ENCOUNTER — Encounter: Payer: Self-pay | Admitting: Emergency Medicine

## 2016-11-23 DIAGNOSIS — I10 Essential (primary) hypertension: Secondary | ICD-10-CM | POA: Diagnosis not present

## 2016-11-23 DIAGNOSIS — E119 Type 2 diabetes mellitus without complications: Secondary | ICD-10-CM | POA: Insufficient documentation

## 2016-11-23 DIAGNOSIS — L03119 Cellulitis of unspecified part of limb: Secondary | ICD-10-CM | POA: Insufficient documentation

## 2016-11-23 DIAGNOSIS — R609 Edema, unspecified: Secondary | ICD-10-CM | POA: Diagnosis not present

## 2016-11-23 DIAGNOSIS — R21 Rash and other nonspecific skin eruption: Secondary | ICD-10-CM | POA: Diagnosis present

## 2016-11-23 LAB — URINALYSIS, COMPLETE (UACMP) WITH MICROSCOPIC
BACTERIA UA: NONE SEEN
BILIRUBIN URINE: NEGATIVE
GLUCOSE, UA: NEGATIVE mg/dL
Hgb urine dipstick: NEGATIVE
KETONES UR: NEGATIVE mg/dL
LEUKOCYTES UA: NEGATIVE
NITRITE: NEGATIVE
PH: 6 (ref 5.0–8.0)
PROTEIN: NEGATIVE mg/dL
Specific Gravity, Urine: 1.009 (ref 1.005–1.030)
Squamous Epithelial / LPF: NONE SEEN

## 2016-11-23 LAB — COMPREHENSIVE METABOLIC PANEL
ALBUMIN: 4.4 g/dL (ref 3.5–5.0)
ALT: 13 U/L — ABNORMAL LOW (ref 14–54)
ANION GAP: 11 (ref 5–15)
AST: 28 U/L (ref 15–41)
Alkaline Phosphatase: 70 U/L (ref 38–126)
BILIRUBIN TOTAL: 0.7 mg/dL (ref 0.3–1.2)
BUN: 19 mg/dL (ref 6–20)
CO2: 28 mmol/L (ref 22–32)
Calcium: 10.7 mg/dL — ABNORMAL HIGH (ref 8.9–10.3)
Chloride: 99 mmol/L — ABNORMAL LOW (ref 101–111)
Creatinine, Ser: 1.04 mg/dL — ABNORMAL HIGH (ref 0.44–1.00)
GFR calc Af Amer: 56 mL/min — ABNORMAL LOW (ref >60)
GFR calc non Af Amer: 49 mL/min — ABNORMAL LOW (ref >60)
GLUCOSE: 123 mg/dL — AB (ref 65–99)
Potassium: 3.4 mmol/L — ABNORMAL LOW (ref 3.5–5.1)
SODIUM: 138 mmol/L (ref 135–145)
TOTAL PROTEIN: 7.4 g/dL (ref 6.5–8.1)

## 2016-11-23 LAB — CBC WITH DIFFERENTIAL/PLATELET
BASOS ABS: 0 10*3/uL (ref 0–0.1)
BASOS PCT: 1 %
EOS ABS: 0.3 10*3/uL (ref 0–0.7)
EOS PCT: 8 %
HEMATOCRIT: 35.5 % (ref 35.0–47.0)
Hemoglobin: 11.9 g/dL — ABNORMAL LOW (ref 12.0–16.0)
Lymphocytes Relative: 30 %
Lymphs Abs: 0.9 10*3/uL — ABNORMAL LOW (ref 1.0–3.6)
MCH: 30.1 pg (ref 26.0–34.0)
MCHC: 33.6 g/dL (ref 32.0–36.0)
MCV: 89.5 fL (ref 80.0–100.0)
MONO ABS: 0.5 10*3/uL (ref 0.2–0.9)
MONOS PCT: 17 %
Neutro Abs: 1.4 10*3/uL (ref 1.4–6.5)
Neutrophils Relative %: 44 %
PLATELETS: 193 10*3/uL (ref 150–440)
RBC: 3.97 MIL/uL (ref 3.80–5.20)
RDW: 13.8 % (ref 11.5–14.5)
WBC: 3.1 10*3/uL — ABNORMAL LOW (ref 3.6–11.0)

## 2016-11-23 LAB — LACTIC ACID, PLASMA
Lactic Acid, Venous: 2.1 mmol/L (ref 0.5–1.9)
Lactic Acid, Venous: 1.2 mmol/L (ref 0.5–1.9)

## 2016-11-23 LAB — BRAIN NATRIURETIC PEPTIDE: B NATRIURETIC PEPTIDE 5: 62 pg/mL (ref 0.0–100.0)

## 2016-11-23 LAB — TROPONIN I: Troponin I: <0.03 ng/mL (ref <0.03)

## 2016-11-23 MED ORDER — VANCOMYCIN HCL IN DEXTROSE 1-5 GM/200ML-% IV SOLN
1000.0000 mg | Freq: Once | INTRAVENOUS | Status: AC
  Administered 2016-11-23: 1000 mg via INTRAVENOUS
  Filled 2016-11-23: qty 200

## 2016-11-23 MED ORDER — CEPHALEXIN 250 MG PO CAPS: 250.0000 mg | ORAL_CAPSULE | Freq: Four times a day (QID) | ORAL | 0 refills | Status: AC

## 2016-11-23 NOTE — ED Triage Notes (Signed)
Pt with blisters noted to bilateral lower legs. Pt also complains of bilateral foot swelling. Pt denies fever, pain, itching. Pt appears in no acute distress. No resp distress noted. Pt denies known insect bite. No weeping or open blisters noted, blisters are closed and have serous fluid.

## 2016-11-23 NOTE — ED Notes (Signed)
One set of blood cultures obtained in triage.  

## 2016-11-23 NOTE — ED Provider Notes (Addendum)
[facility] Emergency Department Provider Note       Time seen: ----------------------------------------- 8:34 PM on 11/23/2016 -----------------------------------------     I have reviewed the triage vital signs and the nursing notes.   HISTORY   Chief Complaint Foot Swelling and Blister    HPI Sydney Schultz is a 83 y.o. female who presents to the ED for blisters noted to bilateral lower legs. Patient also complains of bilateral foot swelling. She denies fever, pain but has had some itching in her arms. She denies fevers, chills, chest pain, shortness of breath, vomiting but has had chronic diarrhea. She has never had swelling like this before.   Past Medical History:  Diagnosis Date  . Diabetes (HCC)   . Hypertension     There are no active problems to display for this patient.   Past Surgical History:  Procedure Laterality Date  . CESAREAN SECTION     x 2  . FOOT SURGERY Left   . KNEE SURGERY Bilateral   . SHOULDER SURGERY Right     Allergies Patient has no known allergies.  Social History Social History  Substance Use Topics  . Smoking status: Never Smoker  . Smokeless tobacco: Never Used  . Alcohol use No    Review of Systems Constitutional: Negative for fever. Cardiovascular: Negative for chest pain. Respiratory: Negative for shortness of breath. Gastrointestinal: Negative for abdominal pain, vomiting and diarrhea. Genitourinary: Negative for dysuria. Musculoskeletal: Negative for back pain.Positive for peripheral edema Skin: Negative for rash. Neurological: Negative for headaches, focal weakness or numbness.  All systems negative/normal/unremarkable except as stated in the HPI  ____________________________________________   PHYSICAL EXAM:  VITAL SIGNS: ED Triage Vitals [11/23/16 1901]  Enc Vitals Group     BP (!) 157/61     Pulse Rate 80     Resp 16     Temp 98.7 F (37.1 C)     Temp Source Oral     SpO2  100 %     Weight 157 lb (71.2 kg)     Height 5\' 1"  (1.549 m)     Head Circumference      Peak Flow      Pain Score      Pain Loc      Pain Edu?      Excl. in GC?     Constitutional: Alert and oriented. Well appearing and in no distress. Eyes: Conjunctivae are normal. Normal extraocular movements. ENT   Head: Normocephalic and atraumatic.   Nose: No congestion/rhinnorhea.   Mouth/Throat: Mucous membranes are moist.   Neck: No stridor. Cardiovascular: Normal rate, regular rhythm. No murmurs, rubs, or gallops. Respiratory: Normal respiratory effort without tachypnea nor retractions. Breath sounds are clear and equal bilaterally. No wheezes/rales/rhonchi. Gastrointestinal: Soft and nontender. Normal bowel sounds Musculoskeletal: Nontender with normal range of motion in extremities. Mild pitting edema to mid tibia bilaterally. There are vesicular lesions on the left leg. There is a plantar wound on the right midfoot with surrounding erythema Neurologic:  Normal speech and language. No gross focal neurologic deficits are appreciated.  Skin:  Erythema on the bottom of the right foot, blisters are noted in the left lower extremity Psychiatric: Mood and affect are normal. Speech and behavior are normal.  ____________________________________________  ED COURSE:  Pertinent labs & imaging results that were available during my care of the patient were reviewed by me and considered in my medical decision making (see chart for details). Patient presents for peripheral edema with  blister formation, we will assess with labs and imaging as indicated.   Procedures ____________________________________________   LABS (pertinent positives/negatives)  Labs Reviewed  CBC WITH DIFFERENTIAL/PLATELET - Abnormal; Notable for the following:       Result Value   WBC 3.1 (*)    Hemoglobin 11.9 (*)    Lymphs Abs 0.9 (*)    All other components within normal limits  COMPREHENSIVE METABOLIC  PANEL - Abnormal; Notable for the following:    Potassium 3.4 (*)    Chloride 99 (*)    Glucose, Bld 123 (*)    Creatinine, Ser 1.04 (*)    Calcium 10.7 (*)    ALT 13 (*)    GFR calc non Af Amer 49 (*)    GFR calc Af Amer 56 (*)    All other components within normal limits  LACTIC ACID, PLASMA - Abnormal; Notable for the following:    Lactic Acid, Venous 2.1 (*)    All other components within normal limits  URINALYSIS, COMPLETE (UACMP) WITH MICROSCOPIC - Abnormal; Notable for the following:    Color, Urine YELLOW (*)    APPearance CLEAR (*)    All other components within normal limits  CULTURE, BLOOD (ROUTINE X 2)  CULTURE, BLOOD (ROUTINE X 2)  TROPONIN I  BRAIN NATRIURETIC PEPTIDE  LACTIC ACID, PLASMA    RADIOLOGY Images were viewed by me  US venous  IMPRESSION: No evidence of DVT within either lower extremity. Right foot x-ray IMPRESSION: 1. No definite acute osseous abnormality 2. Pes planus deformity. 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 ____________________________________________  FINAL ASSESSMENT AND PLAN  Edema  Plan: Patient's labs and imaging were dictated above. Patient had presented for peripheral edema for which I will encourage increase Lasix. She does have a wound on the plantar surface of the right foot which will need to be rechecked. All prescribe Keflex for antibiotic coverage and she received a dose of vancomycin here. Otherwise her labs are stable with improving lactate, she will be referred to the wound clinic for outpatient follow-up.   Emily FilbertWilliams, Shawny Borkowski E, MD   Note: This note was generated in part or whole with voice recognition software. Voice recognition is usually quite accurate but there are transcription errors that can and very often do occur. I apologize for any typographical errors that were not detected and corrected.     Emily FilbertWilliams, Trinady Milewski E, MD 11/23/16 2249    Emily FilbertWilliams, Dondi Burandt E, MD 11/23/16 2312

## 2016-11-23 NOTE — ED Notes (Signed)
Pt discharged to home.  Family member driving.  Discharge instructions reviewed.  Verbalized understanding.  No questions or concerns at this time.  Teach back verified.  Pt in NAD.  No items left in ED.   

## 2016-11-23 NOTE — ED Notes (Signed)
Charge nurse notified

## 2016-11-28 ENCOUNTER — Encounter: Payer: Medicare Other | Attending: Surgery | Admitting: Surgery

## 2016-11-28 DIAGNOSIS — I890 Lymphedema, not elsewhere classified: Secondary | ICD-10-CM | POA: Insufficient documentation

## 2016-11-28 DIAGNOSIS — E11622 Type 2 diabetes mellitus with other skin ulcer: Secondary | ICD-10-CM | POA: Diagnosis present

## 2016-11-28 DIAGNOSIS — Z79899 Other long term (current) drug therapy: Secondary | ICD-10-CM | POA: Insufficient documentation

## 2016-11-28 DIAGNOSIS — E785 Hyperlipidemia, unspecified: Secondary | ICD-10-CM | POA: Diagnosis not present

## 2016-11-28 DIAGNOSIS — Z96653 Presence of artificial knee joint, bilateral: Secondary | ICD-10-CM | POA: Insufficient documentation

## 2016-11-28 DIAGNOSIS — M14671 Charcot's joint, right ankle and foot: Secondary | ICD-10-CM | POA: Diagnosis not present

## 2016-11-28 DIAGNOSIS — G4730 Sleep apnea, unspecified: Secondary | ICD-10-CM | POA: Diagnosis not present

## 2016-11-28 DIAGNOSIS — D649 Anemia, unspecified: Secondary | ICD-10-CM | POA: Diagnosis not present

## 2016-11-28 DIAGNOSIS — Z7984 Long term (current) use of oral hypoglycemic drugs: Secondary | ICD-10-CM | POA: Insufficient documentation

## 2016-11-28 DIAGNOSIS — I130 Hypertensive heart and chronic kidney disease with heart failure and stage 1 through stage 4 chronic kidney disease, or unspecified chronic kidney disease: Secondary | ICD-10-CM | POA: Insufficient documentation

## 2016-11-28 DIAGNOSIS — N189 Chronic kidney disease, unspecified: Secondary | ICD-10-CM | POA: Diagnosis not present

## 2016-11-28 DIAGNOSIS — M1990 Unspecified osteoarthritis, unspecified site: Secondary | ICD-10-CM | POA: Insufficient documentation

## 2016-11-28 DIAGNOSIS — I509 Heart failure, unspecified: Secondary | ICD-10-CM | POA: Insufficient documentation

## 2016-11-28 DIAGNOSIS — M14672 Charcot's joint, left ankle and foot: Secondary | ICD-10-CM | POA: Diagnosis not present

## 2016-11-28 DIAGNOSIS — Z7982 Long term (current) use of aspirin: Secondary | ICD-10-CM | POA: Insufficient documentation

## 2016-11-28 DIAGNOSIS — I2510 Atherosclerotic heart disease of native coronary artery without angina pectoris: Secondary | ICD-10-CM | POA: Insufficient documentation

## 2016-11-28 LAB — CULTURE, BLOOD (ROUTINE X 2)
CULTURE: NO GROWTH
CULTURE: NO GROWTH
Special Requests: ADEQUATE

## 2016-11-29 NOTE — Progress Notes (Signed)
Sydney Schultz, Sydney Schultz ([id number]) Visit Report for 11/28/2016 Abuse/Suicide Risk Screen Details Patient Name: Sydney Schultz, Sydney Schultz. Date of Service: 11/28/2016 2:15 PM Medical Record Number: [id number] Patient Account Number: [id number] Date of Birth/Sex: 05-Sep-1931 (83 y.o. Female) Treating RN: Sydney Schultz Primary Care Sydney Schultz Sydney Schultz Sydney Schultz Treating Sydney Schultz in Treatment: Schultz Abuse/Suicide Risk Screen Items Answer ABUSE/SUICIDE RISK SCREEN: Has anyone close to you tried to hurt or harm you recentlyo No Do you feel uncomfortable with anyone in your familyo No Has anyone forced you do things that you didnot want to doo No Do you have any thoughts of harming yourselfo No Patient displays signs or symptoms of abuse and/or neglect. No Electronic Signature(s) Signed: 11/28/2016 3:35:12 PM By: Sydney Gurney, BSN, RN, CWS, Sydney Schultz Entered By: Sydney Schultz on 11/28/2016 15:06:19 Sydney Schultz, Sydney Schultz ([id number]) -------------------------------------------------------------------------------- Activities of Daily Living Details Patient Name: Sydney Schultz. Date of Service: 11/28/2016 2:15 PM Medical Record Number: [id number] Patient Account Number: [id number] Date of Birth/Sex: 1932-03-23 (83 y.o. Female) Treating RN: Sydney Schultz Primary Care [place] Kielbasa: Sydney Schultz Sydney Schultz Sydney Schultz Treating Sydney Schultz in Treatment: Schultz Activities of Daily Living Items Answer Activities of Daily Living (Please select one for each item) Drive Automobile Completely Able Take Medications Completely Able Use Telephone Completely Able Care for Appearance Completely Able Use Toilet Completely Able Bath / Shower Completely Able Dress Self Completely Able Feed Self Completely Able Walk Completely Able Get In / Out Bed Completely Able Housework Completely  Able Prepare Meals Completely Able Handle Money Completely Able Shop for Self Completely Able Electronic Signature(s) Signed: 11/28/2016 3:35:12 PM By: Sydney Gurney, BSN, RN, CWS, Sydney Schultz Entered By: Sydney Schultz on 11/28/2016 15:06:31 Sydney Schultz ([id number]) -------------------------------------------------------------------------------- Education Assessment Details Patient Name: Sydney Schultz. Date of Service: 11/28/2016 2:15 PM Medical Record Number: [id number] Patient Account Number: [id number] Date of Birth/Sex: 05-25-1931 (83 y.o. Female) Treating RN: Sydney Schultz Primary Care Jasim Harari: Sydney Schultz Sydney Schultz Sydney Schultz Treating Sydney Schultz in Treatment: Schultz Primary Learner Assessed: Patient Learning Preferences/Education Level/Primary Language Learning Preference: Explanation, Demonstration, Printed Material Highest Education Level: High School Preferred Language: English Cognitive Barrier Assessment/Beliefs Language Barrier: No Translator Needed: No Memory Deficit: No Emotional Barrier: No Cultural/Religious Beliefs Affecting Medical No Care: Physical Barrier Assessment Impaired Vision: Yes Glasses Impaired Hearing: No Decreased Hand dexterity: No Knowledge/Comprehension Assessment Knowledge Level: High Comprehension Level: High Ability to understand written High instructions: Ability to understand verbal High instructions: Motivation Assessment Anxiety Level: Calm Cooperation: Cooperative Education Importance: Acknowledges Need Interest in Health Problems: Asks Questions Perception: Coherent Willingness to Engage in Self- High Management Activities: Readiness to Engage in Self- High Management Activities: Electronic Signature(s) Sydney Schultz, Sydney Schultz ([id number]) Signed: 11/28/2016 3:35:12 PM By: Sydney Gurney, BSN, RN, CWS, Sydney Schultz Entered By: Sydney Schultz on 11/28/2016  15:07:34 Sydney Schultz, Sydney Schultz ([id number]) -------------------------------------------------------------------------------- Fall Risk Assessment Details Patient Name: Sydney Schultz. Date of Service: 11/28/2016 2:15 PM Medical Record Number: [id number] Patient Account Number: [id number] Date of Birth/Sex: [birth year] (83 y.o. Female) Treating RN: Sydney Schultz Primary Care Marqus Macphee: Sydney Schultz Sydney Schultz Sydney Schultz Treating Sydney Schultz in Treatment: Schultz Fall Risk Assessment Items Have you had 2 or more falls in the last 12 monthso Schultz Yes Have you had any fall that resulted in injury in the last 12 monthso Schultz No FALL RISK  ASSESSMENT: History of falling - immediate or within 3 months Schultz No Secondary diagnosis Schultz No Ambulatory aid None/bed rest/wheelchair/nurse Schultz Yes Crutches/cane/walker 15 Yes Furniture Schultz No IV Access/Saline Lock Schultz No Gait/Training Normal/bed rest/immobile Schultz No Weak 10 Yes Impaired Schultz No Mental Status Oriented to own ability Schultz Yes Electronic Signature(s) Signed: 11/28/2016 3:35:12 PM By: Sydney GurneyWoody, BSN, RN, CWS, Sydney Schultz Entered By: Sydney GurneyWoody, BSN, RN, CWS, Schultz on 11/28/2016 15:08:07 Sydney Schultz, Sydney M. ([id number]) -------------------------------------------------------------------------------- Foot Assessment Details Patient Name: Sydney Schultz, Sydney M. Date of Service: 11/28/2016 2:15 PM Medical Record Number: [id number] Patient Account Number: [id number] Date of Birth/Sex: 01-07-1932 [age] y.o. Female) Treating RN: Sydney CoventryWoody, Schultz Primary Care Sydney Schultz Sydney Schultz Emmalise Huard: Sydney Schultz Treating Sydney Heyne/Extender: Sydney ReBritto, Sydney Schultz Foot Assessment Items Site Locations + = Sensation present, - = Sensation absent, C = Callus, U = Ulcer R = Redness, W = Warmth, M = Maceration, PU = Pre-ulcerative lesion F = Fissure, S = Swelling, D = Dryness Assessment Right: Left: Sydney  Deformity: Yes Yes Prior Foot Ulcer: No No Prior Amputation: No Yes Charcot Joint: No No Ambulatory Status: Ambulatory With Help Assistance Device: Walker GaitFutures trader: Steady Electronic Signature(s) Signed: 11/28/2016 3:35:12 PM By: Sydney GurneyWoody, BSN, RN, CWS, Sydney Schultz Entered By: Sydney GurneyWoody, BSN, RN, CWS, Schultz on 11/28/2016 15:09:51 Sydney Schultz, Sydney M. ([id number]) -------------------------------------------------------------------------------- Nutrition Risk Assessment Details Patient Name: Sydney Schultz, Sydney M. Date of Service: 11/28/2016 2:15 PM Medical Record Number: [id number] Patient Account Number: [id number] Date of Birth/Sex: 01-07-1932 [age] y.o. Female) Treating RN: Sydney CoventryWoody, Schultz Primary Care Ceasar Decandia: Sydney OatsATE, Schultz Sydney Schultz Madailein Londo: Sydney Schultz Treating Isamar Nazir/Extender: Sydney ReBritto, Sydney Schultz Height (in): 59 Weight (lbs): 157 Body Mass Index (BMI): 31.7 Nutrition Risk Assessment Items NUTRITION RISK SCREEN: I have an illness or condition that made me change the kind and/or Schultz No amount of food I eat I eat fewer than two meals per day Schultz No I eat few fruits and vegetables, or milk products Schultz No I have three or more drinks of beer, liquor or wine almost every day Schultz No I have tooth or mouth problems that make it hard for me to eat Schultz No I don't always have enough money to buy the food I need Schultz No I eat alone most of the time Schultz No I take three or more different prescribed or over-the-counter drugs a Schultz No day Without wanting to, I have lost or gained 10 pounds in the last six Schultz No months I am not always physically able to shop, cook and/or feed myself Schultz No Nutrition Protocols Good Risk Protocol Schultz No interventions needed Moderate Risk Protocol Electronic Signature(s) Signed: 11/28/2016 3:35:12 PM By: Sydney GurneyWoody, BSN, RN, CWS, Sydney Schultz Entered By: Sydney GurneyWoody, BSN, RN, CWS, Schultz on 11/28/2016 15:08:20

## 2016-11-29 NOTE — Progress Notes (Addendum)
Sydney, Schultz ([id number]) Visit Report for 11/28/2016 Chief Complaint Document Details Patient Name: Sydney Schultz, Sydney Schultz. Date of Service: 11/28/2016 2:15 PM Medical Record Number: [id number] Patient Account Number: [id number] Date of Birth/Sex: 03-20-1932 (83 y.o. Female) Treating RN: Huel Coventry Primary Care Provider: Dewaine Oats Other Clinician: Referring Provider: Daryel November Treating Provider/Extender: [name]: 0 Information Obtained from: Patient Chief Complaint Compression wrap change due to edema and blisters on the left lower extremity for about 4 weeks Electronic Signature(s) Signed: 11/28/2016 3:36:15 PM By: Evlyn Kanner MD, FACS Entered By: Evlyn Kanner on 11/28/2016 15:36:14 Sydney Folks ([id number]) -------------------------------------------------------------------------------- HPI Details Patient Name: Sydney Schultz, Sydney Schultz. Date of Service: 11/28/2016 2:15 PM Medical Record Number: [id number] Patient Account Number: [id number] Date of Birth/Sex: 17-Aug-1931 (83 y.o. Female) Treating RN: Huel Coventry Primary Care Provider: Dewaine Oats Other Clinician: Referring Provider: Daryel November Treating Provider/Extender: [name]: 0 History of Present Illness Location: swelling with blisters on the left lower extremity Quality: Patient reports experiencing a dull pain to affected area(s). Severity: Patient states wound are getting worse. Duration: Patient has had the wound for < 4 weeks prior to presenting for treatment Timing: Pain in wound is constant (hurts all the time) Context: The wound would happen gradually Modifying Factors: Other treatment(s) tried include:Lasix, Keflex and referred to the wound care center Associated Signs and Symptoms: Patient reports having increase swelling. HPI Description: this 83 year old patient was recently seen in the ER for a blister noted to both lower extremities and also  complained of swelling in the feet.her past medical history significant for diabetes mellitus, hypertension, status post left foot surgery, bilateral knee surgery and right shoulder surgery. Sydney Schultz has never been a smoker. during her a ER visit there was no evidence of DVT both lower extremities and her right foot x-ray showed no acute osseous abnormality. fluoroscopy edema Sydney Schultz was prescribed Lasix and because of some erythema on the right foot Sydney Schultz is prescribed Keflex and was given a dose of vancomycin. Sydney Schultz was offered an appointment at the wound clinic for outpatient follow-up. the patient has a past medical history of anemia, atherosclerotic heart disease, diabetes mellitus type 2 uncomplicated, hyperlipidemia, obesity, osteoarthritis, sleep apnea, status post hysterectomy, status post bilateral knee replacements, cardiac catheterization,tendon repair in the left foot. Electronic Signature(s) Signed: 11/28/2016 3:37:09 PM By: Evlyn Kanner MD, FACS Previous Signature: 11/28/2016 3:13:25 PM Version By: Evlyn Kanner MD, FACS Previous Signature: 11/28/2016 3:01:03 PM Version By: Evlyn Kanner MD, FACS Previous Signature: 11/28/2016 2:52:59 PM Version By: Evlyn Kanner MD, FACS Previous Signature: 11/28/2016 2:51:14 PM Version By: Evlyn Kanner MD, FACS Entered By: Evlyn Kanner on 11/28/2016 15:37:09 Sydney Folks ([id number]) -------------------------------------------------------------------------------- Physical Exam Details Patient Name: Sydney Schultz, SHEK. Date of Service: 11/28/2016 2:15 PM Medical Record Number: [id number] Patient Account Number: [id number] Date of Birth/Sex: 11-19-1931 (83 y.o. Female) Treating RN: Huel Coventry Primary Care Provider: Dewaine Oats Other Clinician: Referring Provider: Daryel November Treating Provider/Extender: [name]: 0 Constitutional . Pulse regular. Respirations normal and unlabored. Afebrile. . Eyes Nonicteric. Reactive to  light. Ears, Nose, Mouth, and Throat Lips, teeth, and gums WNL.Marland Kitchen Moist mucosa without lesions. Neck supple and nontender. No palpable supraclavicular or cervical adenopathy. Normal sized without goiter. Respiratory WNL. No retractions.. Breath sounds WNL, No rubs, rales, rhonchi, or wheeze.. Cardiovascular Pedal Pulses WNL. ABI on the left and right is 1.08. No clubbing, cyanosis, but has bilateral stage II lymphedema. Chest Breasts symmetical and no nipple discharge.. Breast  tissue WNL, no masses, lumps, or tenderness.. Gastrointestinal (GI) Abdomen without masses or tenderness.. No liver or spleen enlargement or tenderness.. Lymphatic No adneopathy. No adenopathy. No adenopathy. Musculoskeletal Adexa without tenderness or enlargement.. Digits and nails w/o clubbing, cyanosis, infection, petechiae, ischemia, or inflammatory conditions.. Integumentary (Hair, Skin) No suspicious lesions. No crepitus or fluctuance. No peri-wound warmth or erythema. No masses.Marland Kitchen. Psychiatric Judgement and insight Intact.. No evidence of depression, anxiety, or agitation.. Notes bilateral lower extremity stage II lymphedema with left-sided having several small unopened blisters. Sydney Schultz has Charcot feet both lower extremities but no evidence of any ulceration. Electronic Signature(s) Signed: 11/28/2016 3:38:10 PM By: Evlyn KannerBritto, Neli Fofana MD, FACS Entered By: Evlyn KannerBritto, Irene Collings on 11/28/2016 15:38:09 Sydney Schultz, Sydney M. ([id number] Folks) Tiano, Ortencia M. ([id number]) -------------------------------------------------------------------------------- Physician Orders Details Patient Name: Sydney Schultz, Sydney M. Date of Service: 11/28/2016 2:15 PM Medical Record Number: [id number] Patient Account Number: [id number] Date of Birth/Sex: March 08, 1932 [age] y.o. Female) Treating RN: Huel CoventryWoody, Kim Primary Care Provider: Dewaine OatsATE, DENNY Other Clinician: Referring Provider: Daryel NovemberWILLIAMS, JONATHAN Treating Provider/Extender: Rudene ReBritto, Rio Taber Weeks in Treatment:  0 Verbal / Phone Orders: No Diagnosis Coding Primary Wound Dressing Wound #1 Left,Medial,Anterior Lower Leg o Aquacel Ag Secondary Dressing Wound #1 Left,Medial,Anterior Lower Leg o ABD pad Dressing Change Frequency Wound #1 Left,Medial,Anterior Lower Leg o Change dressing every week Wound #2 Right,Medial Lower Leg o Change dressing every week Follow-up Appointments Wound #1 Left,Medial,Anterior Lower Leg o Return Appointment in 1 week. Wound #2 Right,Medial Lower Leg o Return Appointment in 1 week. Edema Control Wound #1 Left,Medial,Anterior Lower Leg o 3 Layer Compression System - Left Lower Extremity o Elevate legs to the level of the heart and pump ankles as often as possible Wound #2 Right,Medial Lower Leg o 3 Layer Compression System - Left Lower Extremity o Elevate legs to the level of the heart and pump ankles as often as possible o Elevate legs to the level of the heart and pump ankles as often as possible Electronic Signature(s) Signed: 11/28/2016 3:35:12 PM By: Elliot GurneyWoody, BSN, RN, CWS, Kim RN, BSN Signed: 11/28/2016 4:39:35 PM By: Evlyn KannerBritto, Zaquan Duffner MD, FACS Sydney Schultz, Jazzmin M. ([id number]) Entered By: Elliot GurneyWoody, BSN, RN, CWS, Kim on 11/28/2016 15:21:01 Sydney Schultz, Graci M. ([id number]) -------------------------------------------------------------------------------- Problem List Details Patient Name: Sydney Schultz, Sydney M. Date of Service: 11/28/2016 2:15 PM Medical Record Number: [id number] Patient Account Number: [id number] Date of Birth/Sex: March 08, 1932 [age] y.o. Female) Treating RN: Huel CoventryWoody, Kim Primary Care Provider: Dewaine OatsATE, DENNY Other Clinician: Referring Provider: Daryel NovemberWILLIAMS, JONATHAN Treating Provider/Extender: Rudene ReBritto, Katti Pelle Weeks in Treatment: 0 Active Problems ICD-10 Encounter Code Description Active Date Diagnosis E11.622 Type 2 diabetes mellitus with other skin ulcer 11/28/2016 Yes I89.0 Lymphedema, not elsewhere classified 11/28/2016 Yes M14.671 Charcot's  joint, right ankle and foot 11/28/2016 Yes M14.672 Charcot's joint, left ankle and foot 11/28/2016 Yes Inactive Problems Resolved Problems Electronic Signature(s) Signed: 11/28/2016 3:41:03 PM By: Evlyn KannerBritto, Malashia Kamaka MD, FACS Previous Signature: 11/28/2016 3:35:37 PM Version By: Evlyn KannerBritto, Kenan Moodie MD, FACS Entered By: Evlyn KannerBritto, Raequon Catanzaro on 11/28/2016 15:41:02 Sydney Schultz, Shreeya M. ([id number]) -------------------------------------------------------------------------------- Progress Note Details Patient Name: Sydney Schultz, Sydney M. Date of Service: 11/28/2016 2:15 PM Medical Record Number: [id number] Patient Account Number: [id number] Date of Birth/Sex: March 08, 1932 [age] y.o. Female) Treating RN: Huel CoventryWoody, Kim Primary Care Provider: Dewaine OatsATE, DENNY Other Clinician: Referring Provider: Daryel NovemberWILLIAMS, JONATHAN Treating Provider/Extender: Rudene ReBritto, Daltyn Degroat Weeks in Treatment: 0 Subjective Chief Complaint Information obtained from Patient Compression wrap change due to edema and blisters on the left lower extremity for about 4 weeks History of Present Illness (HPI) The following HPI elements were documented  for the patient's wound: Location: swelling with blisters on the left lower extremity Quality: Patient reports experiencing a dull pain to affected area(s). Severity: Patient states wound are getting worse. Duration: Patient has had the wound for < 4 weeks prior to presenting for treatment Timing: Pain in wound is constant (hurts all the time) Context: The wound would happen gradually Modifying Factors: Other treatment(s) tried include:Lasix, Keflex and referred to the wound care center Associated Signs and Symptoms: Patient reports having increase swelling. this 83 year old patient was recently seen in the ER for a blister noted to both lower extremities and also complained of swelling in the feet.her past medical history significant for diabetes mellitus, hypertension, status post left foot surgery, bilateral knee surgery and  right shoulder surgery. Sydney Schultz has never been a smoker. during her a ER visit there was no evidence of DVT both lower extremities and her right foot x-ray showed no acute osseous abnormality. fluoroscopy edema Sydney Schultz was prescribed Lasix and because of some erythema on the right foot Sydney Schultz is prescribed Keflex and was given a dose of vancomycin. Sydney Schultz was offered an appointment at the wound clinic for outpatient follow-up. the patient has a past medical history of anemia, atherosclerotic heart disease, diabetes mellitus type 2 uncomplicated, hyperlipidemia, obesity, osteoarthritis, sleep apnea, status post hysterectomy, status post bilateral knee replacements, cardiac catheterization,tendon repair in the left foot. Wound History Patient reportedly has not tested positive for osteomyelitis. Patient reportedly has not had testing performed to evaluate circulation in the legs. Patient History Information obtained from Patient, Chart. Allergies No Known Drug Allergies Sydney Schultz, Sydney Schultz ([id number]) Family History Hypertension - Mother, Stroke - Father, No family history of Cancer, Diabetes, Heart Disease, Kidney Disease, Lung Disease, Seizures, Thyroid Problems, Tuberculosis. Social History Former smoker, Marital Status - Married, Alcohol Use - Never, Drug Use - No History, Caffeine Use - Daily. Medical History Eyes Patient has history of Cataracts Denies history of Glaucoma, Optic Neuritis Ear/Nose/Mouth/Throat Denies history of Chronic sinus problems/congestion, Middle ear problems Hematologic/Lymphatic Patient has history of Lymphedema Denies history of Anemia, Hemophilia, Human Immunodeficiency Virus, Sickle Cell Disease Respiratory Denies history of Aspiration, Asthma, Chronic Obstructive Pulmonary Disease (COPD), Pneumothorax, Sleep Apnea, Tuberculosis Cardiovascular Denies history of Angina, Arrhythmia, Congestive Heart Failure, Coronary Artery Disease, Deep Vein Thrombosis, Hypertension,  Hypotension, Myocardial Infarction, Peripheral Arterial Disease, Peripheral Venous Disease, Phlebitis, Vasculitis Gastrointestinal Denies history of Cirrhosis , Colitis, Crohn s, Hepatitis A, Hepatitis B, Hepatitis C Endocrine Patient has history of Type II Diabetes Denies history of Type I Diabetes Genitourinary Denies history of End Stage Renal Disease Immunological Denies history of Lupus Erythematosus, Raynaud s, Scleroderma Integumentary (Skin) Denies history of History of Burn, History of pressure wounds Musculoskeletal Denies history of Gout, Rheumatoid Arthritis, Osteoarthritis, Osteomyelitis Neurologic Denies history of Dementia, Neuropathy, Quadriplegia, Paraplegia, Seizure Disorder Oncologic Denies history of Received Chemotherapy, Received Radiation Psychiatric Denies history of Anorexia/bulimia, Confinement Anxiety Patient is treated with Oral Agents. Blood sugar is not tested. Medical And Surgical History Notes Constitutional Symptoms (General Health) Diabetic II, Review of Systems (ROS) Sydney Schultz, Sydney Schultz. ([id number]) Constitutional Symptoms (General Health) The patient has no complaints or symptoms. Eyes Complains or has symptoms of Glasses / Contacts. Ear/Nose/Mouth/Throat The patient has no complaints or symptoms. Hematologic/Lymphatic The patient has no complaints or symptoms. Respiratory Complains or has symptoms of Shortness of Breath. Cardiovascular Complains or has symptoms of LE edema - bilateral. Gastrointestinal Complains or has symptoms of Frequent diarrhea. Denies complaints or symptoms of Nausea, Vomiting. Endocrine The patient has no  complaints or symptoms. Genitourinary Complains or has symptoms of Incontinence/dribbling. Denies complaints or symptoms of Kidney failure/ Dialysis. Immunological The patient has no complaints or symptoms. Integumentary (Skin) Complains or has symptoms of Wounds. Denies complaints or symptoms of Bleeding or  bruising tendency, Breakdown, Swelling. Musculoskeletal The patient has no complaints or symptoms. Neurologic The patient has no complaints or symptoms. Oncologic The patient has no complaints or symptoms. Psychiatric The patient has no complaints or symptoms. Medications Hyzaar 100 mg-25 mg tablet oral tablet oral metformin 500 mg tablet oral tablet oral Vytorin 10 mg-40 mg tablet oral tablet oral Levbid 0.375 mg tablet,extended release oral tablet extended release 12 hr oral colestipol 1 gram tablet oral tablet oral Calcium 500 + D 500 mg (1,250 mg)-200 unit tablet oral tablet oral cephalexin 250 mg capsule oral capsule oral ferrous sulfate 325 mg (65 mg iron) tablet oral tablet oral furosemide 40 mg tablet oral tablet oral meloxicam 7.5 mg tablet oral tablet oral Sydney Schultz, SHEPHERD. ([id number]) aspirin 81 mg tablet,delayed release oral tablet,delayed release (DR/EC) oral clopidogrel 75 mg tablet oral tablet oral potassium gluconate 550 mg (90 mg) tablet oral tablet oral potassium gluconate 550 mg (90 mg) tablet oral tablet oral pantoprazole 40 mg tablet,delayed release oral tablet,delayed release (DR/EC) oral oxybutynin chloride 5 mg tablet oral tablet oral vitamin E 400 unit capsule oral capsule oral Objective Constitutional Pulse regular. Respirations normal and unlabored. Afebrile. Vitals Time Taken: 2:32 PM, Height: 59 in, Source: Measured, Weight: 157 lbs, Source: Measured, BMI: 31.7, Temperature: 98.1 F, Pulse: 79 bpm, Respiratory Rate: 16 breaths/min, Blood Pressure: 110/68 mmHg. Eyes Nonicteric. Reactive to light. Ears, Nose, Mouth, and Throat Lips, teeth, and gums WNL.Marland Kitchen Moist mucosa without lesions. Neck supple and nontender. No palpable supraclavicular or cervical adenopathy. Normal sized without goiter. Respiratory WNL. No retractions.. Breath sounds WNL, No rubs, rales, rhonchi, or wheeze.. Cardiovascular Pedal Pulses WNL. ABI on the left and right is 1.08.  No clubbing, cyanosis, but has bilateral stage II lymphedema. Chest Breasts symmetical and no nipple discharge.. Breast tissue WNL, no masses, lumps, or tenderness.. Gastrointestinal (GI) Abdomen without masses or tenderness.. No liver or spleen enlargement or tenderness.. Lymphatic No adneopathy. No adenopathy. No adenopathy. Musculoskeletal Adexa without tenderness or enlargement.. Digits and nails w/o clubbing, cyanosis, infection, petechiae, ischemia, or inflammatory conditions.Marland Kitchen MERDITH, ADAN. ([id number]) Psychiatric Judgement and insight Intact.. No evidence of depression, anxiety, or agitation.. General Notes: bilateral lower extremity stage II lymphedema with left-sided having several small unopened blisters. Sydney Schultz has Charcot feet both lower extremities but no evidence of any ulceration. Integumentary (Hair, Skin) No suspicious lesions. No crepitus or fluctuance. No peri-wound warmth or erythema. No masses.. Wound #1 status is Open. Original cause of wound was Blister. The wound is located on the Left,Medial,Anterior Lower Leg. The wound measures 10cm length x 9cm width x 0.1cm depth; 70.686cm^2 area and 7.069cm^3 volume. There is no tunneling or undermining noted. There is a none present amount of drainage noted. The wound margin is flat and intact. There is no granulation within the wound bed. There is no necrotic tissue within the wound bed. General Notes: blisters Wound #2 status is Open. Original cause of wound was Blister. The wound is located on the Right,Medial Lower Leg. The wound measures 1cm length x 0.5cm width x 0.1cm depth; 0.393cm^2 area and 0.039cm^3 volume. There is no tunneling or undermining noted. There is a none present amount of drainage noted. The wound margin is indistinct and nonvisible. General Notes: blisters Assessment Active Problems  ICD-10 E11.622 - Type 2 diabetes mellitus with other skin ulcer I89.0 - Lymphedema, not elsewhere  classified M14.671 - Charcot's joint, right ankle and foot M14.672 - Charcot's joint, left ankle and foot Elderly lady known to have diabetes mellitus,with some element of chronic kidney disease and CHF also has been on Lasix for a while, has bilateral stage II lymphedema with some blisters on her left lower extremity. After review I have recommended: 1. elevation and exercise 2. Silver alginate with a 3 layer Profore wrap to the left lower extremity 3. Adequate control of her lymphedema with appropriate diuretics as prescribed 4. Good control of her diabetes mellitus JAKALYN, KRATKY. ([id number]) 5. Regular visits to the wound center Procedures Wound #1 Pre-procedure diagnosis of Wound #1 is a Lymphedema located on the Left,Medial,Anterior Lower Leg . There was a Three Layer Compression Therapy Procedure with a pre-treatment ABI of 1.1 by Huel Coventry, RN. Post procedure Diagnosis Wound #1: Same as Pre-Procedure Plan Primary Wound Dressing: Wound #1 Left,Medial,Anterior Lower Leg: Aquacel Ag Secondary Dressing: Wound #1 Left,Medial,Anterior Lower Leg: ABD pad Dressing Change Frequency: Wound #1 Left,Medial,Anterior Lower Leg: Change dressing every week Wound #2 Right,Medial Lower Leg: Change dressing every week Follow-up Appointments: Wound #1 Left,Medial,Anterior Lower Leg: Return Appointment in 1 week. Wound #2 Right,Medial Lower Leg: Return Appointment in 1 week. Edema Control: Wound #1 Left,Medial,Anterior Lower Leg: 3 Layer Compression System - Left Lower Extremity Elevate legs to the level of the heart and pump ankles as often as possible Wound #2 Right,Medial Lower Leg: 3 Layer Compression System - Left Lower Extremity Elevate legs to the level of the heart and pump ankles as often as possible Elevate legs to the level of the heart and pump ankles as often as possible RUHANI, UMLAND ([id number]) Elderly lady known to have diabetes mellitus,with some element of  chronic kidney disease and CHF also has been on Lasix for a while, has bilateral stage II lymphedema with some blisters on her left lower extremity. After review I have recommended: 1. elevation and exercise 2. Silver alginate with a 3 layer Profore wrap to the left lower extremity 3. Adequate control of her lymphedema with appropriate diuretics as prescribed 4. Good control of her diabetes mellitus 5. Regular visits to the wound center Electronic Signature(s) Signed: 11/28/2016 3:41:38 PM By: Evlyn Kanner MD, FACS Previous Signature: 11/28/2016 3:40:38 PM Version By: Evlyn Kanner MD, FACS Entered By: Evlyn Kanner on 11/28/2016 15:41:38 Sydney Folks ([id number]) -------------------------------------------------------------------------------- ROS/PFSH Details Patient Name: TAMEA, BAI. Date of Service: 11/28/2016 2:15 PM Medical Record Number: [id number] Patient Account Number: [id number] Date of Birth/Sex: March 27, 1932 (83 y.o. Female) Treating RN: Huel Coventry Primary Care Provider: Dewaine Oats Other Clinician: Referring Provider: Daryel November Treating Provider/Extender: [name]: 0 Information Obtained From Patient Chart Wound History Do you currently have one or more open woundso No Have you tested positive for osteomyelitis (bone infection)o No Have you had any tests for circulation on your legso No Eyes Complaints and Symptoms: Positive for: Glasses / Contacts Medical History: Positive for: Cataracts Negative for: Glaucoma; Optic Neuritis Respiratory Complaints and Symptoms: Positive for: Shortness of Breath Medical History: Negative for: Aspiration; Asthma; Chronic Obstructive Pulmonary Disease (COPD); Pneumothorax; Sleep Apnea; Tuberculosis Cardiovascular Complaints and Symptoms: Positive for: LE edema - bilateral Medical History: Negative for: Angina; Arrhythmia; Congestive Heart Failure; Coronary Artery Disease; Deep  Vein Thrombosis; Hypertension; Hypotension; Myocardial Infarction; Peripheral Arterial Disease; Peripheral Venous Disease; Phlebitis; Vasculitis Gastrointestinal Complaints and Symptoms: Positive for:  Frequent diarrhea Negative for: Nausea; Vomiting Medical HistoryJAILAH, WILLIS ([id number]) Negative for: Cirrhosis ; Colitis; Crohnos; Hepatitis A; Hepatitis B; Hepatitis C Genitourinary Complaints and Symptoms: Positive for: Incontinence/dribbling Negative for: Kidney failure/ Dialysis Medical History: Negative for: End Stage Renal Disease Integumentary (Skin) Complaints and Symptoms: Positive for: Wounds Negative for: Bleeding or bruising tendency; Breakdown; Swelling Medical History: Negative for: History of Burn; History of pressure wounds Constitutional Symptoms (General Health) Complaints and Symptoms: No Complaints or Symptoms Medical History: Past Medical History Notes: Diabetic II, Ear/Nose/Mouth/Throat Complaints and Symptoms: No Complaints or Symptoms Medical History: Negative for: Chronic sinus problems/congestion; Middle ear problems Hematologic/Lymphatic Complaints and Symptoms: No Complaints or Symptoms Medical History: Positive for: Lymphedema Negative for: Anemia; Hemophilia; Human Immunodeficiency Virus; Sickle Cell Disease Endocrine Complaints and Symptoms: No Complaints or Symptoms Medical History: LYNDSEE, CASA ([id number]) Positive for: Type II Diabetes Negative for: Type I Diabetes Time with diabetes: 15 years Treated with: Oral agents Blood sugar tested every day: No Immunological Complaints and Symptoms: No Complaints or Symptoms Medical History: Negative for: Lupus Erythematosus; Raynaudos; Scleroderma Musculoskeletal Complaints and Symptoms: No Complaints or Symptoms Medical History: Negative for: Gout; Rheumatoid Arthritis; Osteoarthritis; Osteomyelitis Neurologic Complaints and Symptoms: No Complaints or Symptoms Medical  History: Negative for: Dementia; Neuropathy; Quadriplegia; Paraplegia; Seizure Disorder Oncologic Complaints and Symptoms: No Complaints or Symptoms Medical History: Negative for: Received Chemotherapy; Received Radiation Psychiatric Complaints and Symptoms: No Complaints or Symptoms Medical History: Negative for: Anorexia/bulimia; Confinement Anxiety HBO Extended History Items Eyes: Cataracts LAMIAH, MARMOL. ([id number]) Immunizations Pneumococcal Vaccine: Received Pneumococcal Vaccination: Yes Family and Social History Cancer: No; Diabetes: No; Heart Disease: No; Hypertension: Yes - Mother; Kidney Disease: No; Lung Disease: No; Seizures: No; Stroke: Yes - Father; Thyroid Problems: No; Tuberculosis: No; Former smoker; Marital Status - Married; Alcohol Use: Never; Drug Use: No History; Caffeine Use: Daily; Advanced Directives: Yes; Living Will: Yes; Medical Power of Attorney: Yes Electronic Signature(s) Signed: 11/28/2016 3:35:12 PM By: Elliot Gurney, BSN, RN, CWS, Kim RN, BSN Signed: 11/28/2016 4:39:35 PM By: Evlyn Kanner MD, FACS Entered By: Elliot Gurney, BSN, RN, CWS, Kim on 11/28/2016 15:06:03 SRISHTI, STRNAD ([id number]) -------------------------------------------------------------------------------- SuperBill Details Patient Name: ZOHAL, RENY. Date of Service: 11/28/2016 Medical Record Number: [id number] Patient Account Number: [id number] Date of Birth/Sex: [birth year] (83 y.o. Female) Treating RN: Huel Coventry Primary Care Provider: Dewaine Oats Other Clinician: Referring Provider: Daryel November Treating Provider/Extender: [name]: 0 Diagnosis Coding ICD-10 Codes Code Description E11.622 Type 2 diabetes mellitus with other skin ulcer I89.0 Lymphedema, not elsewhere classified M14.671 Charcot's joint, right ankle and foot M14.672 Charcot's joint, left ankle and foot Facility Procedures CPT4 Code: 24401027 Description: 99213 - WOUND CARE VISIT-LEV  3 EST PT Modifier: Quantity: 1 Physician Procedures CPT4 Code: 2536644 Description: 99204 - WC PHYS LEVEL 4 - NEW PT ICD-10 Description Diagnosis E11.622 Type 2 diabetes mellitus with other skin ulcer I89.0 Lymphedema, not elsewhere classified M14.671 Charcot's joint, right ankle and foot M14.672 Charcot's joint, left ankle  and foot Modifier: Quantity: 1 Electronic Signature(s) Signed: 11/28/2016 3:41:53 PM By: Evlyn Kanner MD, FACS Entered By: Evlyn Kanner on 11/28/2016 15:41:53

## 2016-12-02 NOTE — Progress Notes (Addendum)
ROXI, HLAVATY ([id number]) Visit Report for 11/28/2016 Allergy List Details Patient Name: Sydney Schultz, Sydney Schultz. Date of Service: 11/28/2016 2:15 PM Medical Record Number: [id number] Patient Account Number: [id number] Date of Birth/Sex: February 20, 1932 (83 y.o. Female) Treating RN: Huel Coventry Primary Care Pao Haffey: Dewaine Oats Other Clinician: Referring Dmarion Perfect: Daryel November Treating Kelcey Wickstrom/Extender: [name]: 0 Allergies Active Allergies No Known Drug Allergies Allergy Notes Electronic Signature(s) Signed: 11/28/2016 3:35:12 PM By: Elliot Gurney, BSN, RN, CWS, Kim RN, BSN Entered By: Elliot Gurney, BSN, RN, CWS, Kim on 11/28/2016 14:58:18 Sydney Schultz ([id number]) -------------------------------------------------------------------------------- Arrival Information Details Patient Name: Sydney Schultz, Sydney Schultz. Date of Service: 11/28/2016 2:15 PM Medical Record Number: [id number] Patient Account Number: [id number] Date of Birth/Sex: 1931-05-23 (83 y.o. Female) Treating RN: Huel Coventry Primary Care Jeyla Bulger: Dewaine Oats Other Clinician: Referring Milanie Rosenfield: Daryel November Treating Cameren Earnest/Extender: [name]: 0 Visit Information Patient Arrived: Cloyde Reams Time: 14:31 Accompanied By: granddaughter Transfer Assistance: None Patient Identification Verified: Yes Secondary Verification Process Yes Completed: Patient Requires Transmission- No Based Precautions: Patient Has Alerts: Yes Patient Alerts: Patient on Blood Thinner 81 mg Aspirin; Plavix DM II Electronic Signature(s) Signed: 11/28/2016 3:35:12 PM By: Elliot Gurney, BSN, RN, CWS, Kim RN, BSN Entered By: Elliot Gurney, BSN, RN, CWS, Kim on 11/28/2016 14:32:31 Sydney Schultz ([id number]) -------------------------------------------------------------------------------- Clinic Level of Care Assessment Details Patient Name: Sydney Schultz, Sydney Schultz. Date of Service: 11/28/2016 2:15 PM Medical Record Number:  [id number] Patient Account Number: [id number] Date of Birth/Sex: 1932/02/04 (83 y.o. Female) Treating RN: Huel Coventry Primary Care Lyrah Bradt: Dewaine Oats Other Clinician: Referring Abayomi Pattison: Daryel November Treating Fredda Clarida/Extender: [name]: 0 Clinic Level of Care Assessment Items TOOL 1 Quantity Score []  - Use when EandM and Procedure is performed on INITIAL visit 0 ASSESSMENTS - Nursing Assessment / Reassessment X - General Physical Exam (combine w/ comprehensive assessment (listed just 1 20 below) when performed on new pt. evals) X - Comprehensive Assessment (HX, ROS, Risk Assessments, Wounds Hx, etc.) 1 25 ASSESSMENTS - Wound and Skin Assessment / Reassessment []  - Dermatologic / Skin Assessment (not related to wound area) 0 ASSESSMENTS - Ostomy and/or Continence Assessment and Care []  - Incontinence Assessment and Management 0 []  - Ostomy Care Assessment and Management (repouching, etc.) 0 PROCESS - Coordination of Care X - Simple Patient / Family Education for ongoing care 1 15 []  - Complex (extensive) Patient / Family Education for ongoing care 0 X - Staff obtains Chiropractor, Records, Test Results / Process Orders 1 10 []  - Staff telephones HHA, Nursing Homes / Clarify orders / etc 0 []  - Routine Transfer to another Facility (non-emergent condition) 0 []  - Routine Hospital Admission (non-emergent condition) 0 X - New Admissions / Manufacturing engineer / Ordering NPWT, Apligraf, etc. 1 15 []  - Emergency Hospital Admission (emergent condition) 0 PROCESS - Special Needs []  - Pediatric / Minor Patient Management 0 []  - Isolation Patient Management 0 Sydney Schultz, Sydney Schultz. ([id number]) []  - Hearing / Language / Visual special needs 0 []  - Assessment of Community assistance (transportation, D/C planning, etc.) 0 []  - Additional assistance / Altered mentation 0 []  - Support Surface(s) Assessment (bed, cushion, seat, etc.) 0 INTERVENTIONS - Miscellaneous []   - External ear exam 0 []  - Patient Transfer (multiple staff / Nurse, adult / Similar devices) 0 []  - Simple Staple / Suture removal (25 or less) 0 []  - Complex Staple / Suture removal (26 or more) 0 []  - Hypo/Hyperglycemic Management (do not check if billed separately) 0 X -  Ankle / Brachial Index (ABI) - do not check if billed separately 1 15 Has the patient been seen at the hospital within the last three years: Yes Total Score: 100 Level Of Care: New/Established - Level 3 Electronic Signature(s) Signed: 11/28/2016 3:35:12 PM By: Elliot Gurney, BSN, RN, CWS, Kim RN, BSN Entered By: Elliot Gurney, BSN, RN, CWS, Kim on 11/28/2016 15:33:20 Sydney Schultz ([id number]) -------------------------------------------------------------------------------- Compression Therapy Details Patient Name: Sydney Schultz, Sydney Schultz. Date of Service: 11/28/2016 2:15 PM Medical Record Number: [id number] Patient Account Number: [id number] Date of Birth/Sex: 05-01-1932 (83 y.o. Female) Treating RN: Huel Coventry Primary Care Gianny Sabino: Dewaine Oats Other Clinician: Referring Farah Benish: Daryel November Treating Dael Howland/Extender: [name]: 0 Compression Therapy Performed for Wound Wound #1 Left,Medial,Anterior Lower Leg Assessment: Performed By: Clinician Huel Coventry, RN Compression Type: Three Layer Pre Treatment ABI: 1.1 Post Procedure Diagnosis Same as Pre-procedure Electronic Signature(s) Signed: 11/28/2016 3:35:12 PM By: Elliot Gurney, BSN, RN, CWS, Kim RN, BSN Entered By: Elliot Gurney, BSN, RN, CWS, Kim on 11/28/2016 15:18:53 Sydney Schultz ([id number]) -------------------------------------------------------------------------------- Encounter Discharge Information Details Patient Name: Sydney Schultz, Sydney Schultz. Date of Service: 11/28/2016 2:15 PM Medical Record Number: [id number] Patient Account Number: [id number] Date of Birth/Sex: 17-Jul-1931 (83 y.o. Female) Treating RN: Huel Coventry Primary Care Lorrene Graef: Dewaine Oats Other  Clinician: Referring Jora Galluzzo: Daryel November Treating Caleel Kiner/Extender: [name]: 0 Encounter Discharge Information Items Discharge Pain Level: 0 Discharge Condition: Stable Ambulatory Status: Walker Discharge Destination: Home Transportation: Private Auto Accompanied By: granddaughter Schedule Follow-up Appointment: Yes Medication Reconciliation completed and provided to Yes Patient/Care Veldon Wager: Provided on Clinical Summary of Care: 11/28/2016 Form Type Recipient Paper Patient DB Electronic Signature(s) Signed: 11/28/2016 3:35:12 PM By: Elliot Gurney, BSN, RN, CWS, Kim RN, BSN Previous Signature: 11/28/2016 3:29:50 PM Version By: Gwenlyn Perking Entered By: Elliot Gurney BSN, RN, CWS, Kim on 11/28/2016 15:34:09 Sydney Schultz ([id number]) -------------------------------------------------------------------------------- Lower Extremity Assessment Details Patient Name: Sydney Schultz, Sydney Schultz. Date of Service: 11/28/2016 2:15 PM Medical Record Number: [id number] Patient Account Number: [id number] Date of Birth/Sex: 1932-03-27 (83 y.o. Female) Treating RN: Huel Coventry Primary Care Masen Luallen: Dewaine Oats Other Clinician: Referring Dymond Spreen: Daryel November Treating Camella Seim/Extender: [name]: 0 Edema Assessment Assessed: [Left: Yes] [Right: Yes] Edema: [Left: Yes] [Right: Yes] Calf Left: Right: Point of Measurement: 30 cm From Medial Instep 22.4 cm 22.5 cm Ankle Left: Right: Point of Measurement: 10 cm From Medial Instep 34 cm 36.8 cm Vascular Assessment Claudication: Claudication Assessment [Left:None] Pulses: Dorsalis Pedis Palpable: [Left:Yes] [Right:Yes] Doppler Audible: [Left:Yes] [Right:Yes] Posterior Tibial Palpable: [Left:Yes] [Right:Yes] Doppler Audible: [Left:Yes] [Right:Yes] Extremity colors, hair growth, and conditions: Extremity Color: [Left:Normal] [Right:Normal] Hair Growth on Extremity: [Left:No]  [Right:No] Temperature of Extremity: [Left:Cool] [Right:Cool] Capillary Refill: [Left:< 3 seconds] [Right:< 3 seconds] Dependent Rubor: [Left:No] [Right:No] Blanched when Elevated: [Left:No] [Right:No] Lipodermatosclerosis: [Left:No] [Right:No] Blood Pressure: Brachial: [Left:130] [Right:120] Dorsalis Pedis: 140 [Left:Dorsalis Pedis: 140] Ankle: Posterior Tibial: [Left:Posterior Tibial: 140 1.08] [Right:1.08] Toe Nail Assessment KAYLANA, FENSTERMACHER ([id number]) Left: Right: Thick: Yes No Discolored: Yes Yes Deformed: No No Improper Length and Hygiene: No No Electronic Signature(s) Signed: 11/28/2016 3:35:12 PM By: Elliot Gurney, BSN, RN, CWS, Kim RN, BSN Entered By: Elliot Gurney, BSN, RN, CWS, Kim on 11/28/2016 14:57:43 Sydney Schultz ([id number]) -------------------------------------------------------------------------------- Multi Wound Chart Details Patient Name: Sydney Schultz. Date of Service: 11/28/2016 2:15 PM Medical Record Number: [id number] Patient Account Number: [id number] Date of Birth/Sex: [birth year] (83 y.o. Female) Treating RN: Huel Coventry Primary Care Kerri Asche: Dewaine Oats Other Clinician: Referring Jayvan Mcshan: Daryel November Treating  Wrenly Lauritsen/Extender: Rudene ReBritto, Errol Weeks in Treatment: 0 Vital Signs Height(in): 59 Pulse(bpm): 79 Weight(lbs): 157 Blood Pressure 110/68 (mmHg): Body Mass Index(BMI): 32 Temperature(F): 98.1 Respiratory Rate 16 (breaths/min): Photos: [N/A:N/A] Wound Location: Right Lower Leg Right Lower Leg - Medial N/A Wounding Event: Blister Blister N/A Primary Etiology: Lymphedema Lymphedema N/A Date Acquired: 11/22/2016 11/11/2016 N/A Weeks of Treatment: 0 0 N/A Wound Status: Open Open N/A Measurements L x W x D 10x9x0.1 1x0.5x0.1 N/A (cm) Area (cm) : 70.686 0.393 N/A Volume (cm) : 7.069 0.039 N/A % Reduction in Area: N/A 0.00% N/A % Reduction in Volume: N/A 0.00% N/A Classification: Unclassifiable Unclassifiable N/A Exudate Amount:  None Present None Present N/A Wound Margin: Flat and Intact Indistinct, nonvisible N/A Granulation Amount: None Present (0%) N/A N/A Necrotic Amount: None Present (0%) N/A N/A Exposed Structures: Fascia: No Fascia: No N/A Fat Layer (Subcutaneous Fat Layer (Subcutaneous Tissue) Exposed: No Tissue) Exposed: No Tendon: No Tendon: No Muscle: No Muscle: No Joint: No Joint: No Bone: No Bone: No Sydney FolksBIGELOW, Sydney M. ([id number]) Epithelialization: Large (67-100%) Large (67-100%) N/A Periwound Skin Texture: No Abnormalities Noted No Abnormalities Noted N/A Periwound Skin No Abnormalities Noted No Abnormalities Noted N/A Moisture: Periwound Skin Color: No Abnormalities Noted No Abnormalities Noted N/A Tenderness on No No N/A Palpation: Wound Preparation: Topical Anesthetic Topical Anesthetic N/A Applied: None Applied: None Assessment Notes: blisters blisters N/A Procedures Performed: Compression Therapy N/A N/A Treatment Notes Wound #1 (Left, Medial, Anterior Lower Leg) 4. Dressing Applied: Aquacel Ag 5. Secondary Dressing Applied ABD Pad 7. Secured with 3 Layer Compression System - Left Lower Extremity Electronic Signature(s) Signed: 11/28/2016 3:35:43 PM By: Evlyn KannerBritto, Errol MD, FACS Previous Signature: 11/28/2016 3:35:12 PM Version By: Elliot GurneyWoody, BSN, RN, CWS, Kim RN, BSN Entered By: Evlyn KannerBritto, Errol on 11/28/2016 15:35:43 Sydney FolksBIGELOW, Sydney M. ([id number]) -------------------------------------------------------------------------------- Multi-Disciplinary Care Plan Details Patient Name: Sydney FolksBIGELOW, Sydney M. Date of Service: 11/28/2016 2:15 PM Medical Record Number: [id number] Patient Account Number: [id number] Date of Birth/Sex: 10/20/1931 [age] y.o. Female) Treating RN: Huel CoventryWoody, Kim Primary Care Evanny Ellerbe: Dewaine OatsATE, DENNY Other Clinician: Referring Pahola Dimmitt: Daryel NovemberWILLIAMS, JONATHAN Treating Davine Coba/Extender: Rudene ReBritto, Errol Weeks in Treatment: 0 Active Inactive ` Orientation to the Wound Care  Program Nursing Diagnoses: Knowledge deficit related to the wound healing center program Goals: Patient/caregiver will verbalize understanding of the Wound Healing Center Program Date Initiated: 11/28/2016 Target Resolution Date: 12/02/2016 Goal Status: Active Interventions: Provide education on orientation to the wound center Notes: ` Wound/Skin Impairment Nursing Diagnoses: Impaired tissue integrity Goals: Ulcer/skin breakdown will have a volume reduction of 80% by week 12 Date Initiated: 11/28/2016 Target Resolution Date: 03/10/2017 Goal Status: Active Interventions: Assess patient/caregiver ability to perform ulcer/skin care regimen upon admission and as needed Treatment Activities: Skin care regimen initiated : 11/28/2016 Notes: Electronic Signature(s) Signed: 11/28/2016 3:35:12 PM By: Elliot GurneyWoody, BSN, RN, CWS, Kim RN, BSN [address], [place] ([id number]) Entered By: Elliot GurneyWoody, BSN, RN, CWS, Kim on 11/28/2016 15:16:38 Sydney FolksBIGELOW, Sydney M. ([id number]) -------------------------------------------------------------------------------- Pain Assessment Details Patient Name: Sydney FolksBIGELOW, Sydney Schultz M. Date of Service: 11/28/2016 2:15 PM Medical Record Number: [id number] Patient Account Number: [id number] Date of Birth/Sex: 10/20/1931 4(84 y.o. Female) Treating RN: Huel CoventryWoody, Kim Primary Care Sharalyn Lomba: Dewaine OatsATE, DENNY Other Clinician: Referring Emiyah Spraggins: Daryel NovemberWILLIAMS, JONATHAN Treating Rasmus Preusser/Extender: Rudene ReBritto, Errol Weeks in Treatment: 0 Active Problems Location of Pain Severity and Description of Pain Patient Has Paino No Site Locations With Dressing Change: No Pain Management and Medication Current Pain Management: Goals for Pain Management Topical or injectable lidocaine is offered to patient for acute pain when surgical debridement is performed.  If needed, Patient is instructed to use over the counter pain medication for the following 24-48 hours after debridement. Wound care MDs do not prescribed pain  medications. Patient has chronic pain or uncontrolled pain. Patient has been instructed to make an appointment with their Primary Care Physician for pain management. Electronic Signature(s) Signed: 11/28/2016 3:35:12 PM By: Elliot Gurney, BSN, RN, CWS, Kim RN, BSN Entered By: Elliot Gurney, BSN, RN, CWS, Kim on 11/28/2016 14:32:45 Sydney Schultz ([id number]) -------------------------------------------------------------------------------- Patient/Caregiver Education Details Patient Name: ANAYELI, AREL. Date of Service: 11/28/2016 2:15 PM Medical Record Number: [id number] Patient Account Number: [id number] Date of Birth/Gender: December 21, 1931 (84 y.o. Female) Treating RN: Huel Coventry Primary Care Physician: Dewaine Oats Other Clinician: Referring Physician: Daryel November Treating Physician/Extender: [name]: 0 Education Assessment Education Provided To: Patient Education Topics Provided Venous: Handouts: Controlling Swelling with Multilayered Compression Wraps Methods: Demonstration Responses: State content correctly Welcome To The Wound Care Center: Handouts: Welcome To The Wound Care Center Methods: Demonstration, Explain/Verbal Responses: State content correctly Wound/Skin Impairment: Handouts: Caring for Your Ulcer Electronic Signature(s) Signed: 11/28/2016 3:35:12 PM By: Elliot Gurney, BSN, RN, CWS, Kim RN, BSN Entered By: Elliot Gurney, BSN, RN, CWS, Kim on 11/28/2016 15:34:30 Sydney Schultz ([id number]) -------------------------------------------------------------------------------- Wound Assessment Details Patient Name: OLIVIAH, AGOSTINI. Date of Service: 11/28/2016 2:15 PM Medical Record Number: [id number] Patient Account Number: [id number] Date of Birth/Sex: August 07, 1931 (83 y.o. Female) Treating RN: Huel Coventry Primary Care Eliya Bubar: Dewaine Oats Other Clinician: Referring Ranny Wiebelhaus: Daryel November Treating Lenix Kidd/Extender: [name]: 0 Wound  Status Wound Number: 1 Primary Etiology: Lymphedema Wound Location: Right Lower Leg Wound Status: Open Wounding Event: Blister Date Acquired: 11/22/2016 Weeks Of Treatment: 0 Clustered Wound: No Photos Wound Measurements Length: (cm) 10 % Reduction in A Width: (cm) 9 % Reduction in V Depth: (cm) 0.1 Epithelializatio Area: (cm) 70.686 Tunneling: Volume: (cm) 7.069 Undermining: rea: olume: n: Large (67-100%) No No Wound Description Classification: Unclassifiable Wound Margin: Flat and Intact Exudate Amount: None Present Foul Odor After Cleansing: No Slough/Fibrino No Wound Bed Granulation Amount: None Present (0%) Exposed Structure Necrotic Amount: None Present (0%) Fascia Exposed: No Fat Layer (Subcutaneous Tissue) Exposed: No Tendon Exposed: No Muscle Exposed: No Joint Exposed: No Bone Exposed: No Periwound Skin Texture Texture Color JANN, MILKOVICH ([id number]) No Abnormalities Noted: No No Abnormalities Noted: No Moisture No Abnormalities Noted: No Wound Preparation Topical Anesthetic Applied: None Assessment Notes blisters Electronic Signature(s) Signed: 11/28/2016 3:35:12 PM By: Elliot Gurney, BSN, RN, CWS, Kim RN, BSN Entered By: Elliot Gurney, BSN, RN, CWS, Kim on 11/28/2016 14:52:10 Sydney Schultz ([id number]) -------------------------------------------------------------------------------- Wound Assessment Details Patient Name: TANEEKA, CURTNER. Date of Service: 11/28/2016 2:15 PM Medical Record Number: [id number] Patient Account Number: [id number] Date of Birth/Sex: [birth year] (83 y.o. Female) Treating RN: Huel Coventry Primary Care Thong Feeny: Dewaine Oats Other Clinician: Referring Blade Scheff: Daryel November Treating Kazoua Gossen/Extender: [name]: 0 Wound Status Wound Number: 2 Primary Etiology: Lymphedema Wound Location: Right Lower Leg - Medial Wound Status: Open Wounding Event: Blister Date Acquired: 11/11/2016 Weeks Of  Treatment: 0 Clustered Wound: No Photos Wound Measurements Length: (cm) 1 % Reduction in A Width: (cm) 0.5 % Reduction in V Depth: (cm) 0.1 Epithelializatio Area: (cm) 0.393 Tunneling: Volume: (cm) 0.039 Undermining: rea: 0% olume: 0% n: Large (67-100%) No No Wound Description Classification: Unclassifiable Wound Margin: Indistinct, nonvisible Exudate Amount: None Present Foul Odor After Cleansing: No Slough/Fibrino No Wound Bed Exposed Structure Fascia Exposed: No Fat Layer (Subcutaneous Tissue) Exposed: No Tendon Exposed:  No Muscle Exposed: No Joint Exposed: No Bone Exposed: No Periwound Skin Texture Texture Color ZAILEE, VALLELY ([id number]) No Abnormalities Noted: No No Abnormalities Noted: No Moisture No Abnormalities Noted: No Wound Preparation Topical Anesthetic Applied: None Assessment Notes blisters Electronic Signature(s) Signed: 11/28/2016 3:35:12 PM By: Elliot Gurney, BSN, RN, CWS, Kim RN, BSN Entered By: Elliot Gurney, BSN, RN, CWS, Kim on 11/28/2016 14:54:25 Sydney Schultz ([id number]) -------------------------------------------------------------------------------- Vitals Details Patient Name: Sydney Schultz. Date of Service: 11/28/2016 2:15 PM Medical Record Number: [id number] Patient Account Number: [id number] Date of Birth/Sex: Jan 17, 1932 (83 y.o. Female) Treating RN: Huel Coventry Primary Care Azaylia Fong: Dewaine Oats Other Clinician: Referring Aniayah Alaniz: Daryel November Treating Christian Treadway/Extender: [name]: 0 Vital Signs Time Taken: 14:32 Temperature (F): 98.1 Height (in): 59 Pulse (bpm): 79 Source: Measured Respiratory Rate (breaths/min): 16 Weight (lbs): 157 Blood Pressure (mmHg): 110/68 Source: Measured Reference Range: 80 - 120 mg / dl Body Mass Index (BMI): 31.7 Electronic Signature(s) Signed: 11/28/2016 3:35:12 PM By: Elliot Gurney, BSN, RN, CWS, Kim RN, BSN Entered By: Elliot Gurney, BSN, RN, CWS, Kim on 11/28/2016 14:35:04

## 2016-12-06 ENCOUNTER — Encounter: Payer: Medicare Other | Admitting: Surgery

## 2016-12-06 DIAGNOSIS — E11622 Type 2 diabetes mellitus with other skin ulcer: Secondary | ICD-10-CM | POA: Diagnosis not present

## 2016-12-08 NOTE — Progress Notes (Signed)
Sydney Schultz, Sydney M. ([id number]) Visit Report for 12/06/2016 Arrival Information Details Patient Name: Sydney Schultz, Sydney M. Date of Service: 12/06/2016 9:45 AM Medical Record Number: [id number] Patient Account Number: [id number] Date of Birth/Sex: August 20, 1931 [age] y.o. Female) Treating RN: Sydney Schultz Primary Care Sydney Schultz Other Clinician: Referring Sydney Schultz Treating Keshonda Monsour/Extender: Sydney Schultz in Treatment: 1 Visit Information History Since Last Visit All ordered tests and consults were completed: No Patient Arrived: Walker Added or deleted any medications: No Arrival Time: 10:05 Any new allergies or adverse reactions: No Accompanied By: daughter Had a fall or experienced change in No Transfer Assistance: EasyPivot Patient activities of daily living that may affect Lift risk of falls: Patient Identification Verified: Yes Signs or symptoms of abuse/neglect since last No Secondary Verification Process Yes visito Completed: Hospitalized since last visit: No Patient Requires Transmission- No Has Dressing in Place as Prescribed: Yes Based Precautions: Has Compression in Place as Prescribed: Yes Patient Has Alerts: Yes Pain Present Now: Yes Patient Alerts: Patient on Blood Thinner 81 mg Aspirin; Plavix DM II Electronic Signature(s) Signed: 12/06/2016 2:59:16 PM By: Sydney Schultz Entered By: Sydney Schultz on 12/06/2016 10:05:36 Sydney Schultz, Sydney M. ([id number]) -------------------------------------------------------------------------------- Encounter Discharge Information Details Patient Name: Sydney Schultz, Sydney M. Date of Service: 12/06/2016 9:45 AM Medical Record Number: [id number] Patient Account Number: [id number] Date of Birth/Sex: August 20, 1931 [age] y.o. Female) Treating RN: Sydney Schultz Primary Care Hawken Bielby: TATE, [facility] Other Clinician: Referring Jaymison Luber: Dewaine OatsATE, Schultz Treating Jarissa Sheriff/Extender: Sydney Schultz in Treatment:  1 Encounter Discharge Information Items Discharge Pain Level: 2 Discharge Condition: Stable Ambulatory Status: Walker Discharge Destination: Home Transportation: Private Auto Accompanied By: daughter Schedule Follow-up Appointment: Yes Medication Reconciliation completed No and provided to Patient/Care Santiana Glidden: Provided on Clinical Summary of Care: 12/06/2016 Form Type Recipient Paper Patient DB Electronic Signature(s) Signed: 12/06/2016 2:59:16 PM By: Sydney Schultz Previous Signature: 12/06/2016 10:44:55 AM Version By: Sydney Schultz Entered By: Sydney Schultz on 12/06/2016 10:47:02 Sydney Schultz, Sydney M. ([id number]) -------------------------------------------------------------------------------- Lower Extremity Assessment Details Patient Name: Sydney Schultz, Sydney M. Date of Service: 12/06/2016 9:45 AM Medical Record Number: [id number] Patient Account Number: [id number] Date of Birth/Sex: August 20, 1931 [age] y.o. Female) Treating RN: Sydney Schultz Primary Care Onia Shiflett: TATE, Katherina RightENNY Other Clinician: Referring Aleah Ahlgrim: Dewaine OatsATE, Schultz Treating Nygel Prokop/Extender: Sydney Schultz in Treatment: 1 Edema Assessment Assessed: [Left: No] [Right: No] E[Left: dema] [Right: :] Calf Left: Right: Point of Measurement: 30 cm From Medial Instep 33.7 cm 36.5 cm Ankle Left: Right: Point of Measurement: 10 cm From Medial Instep 20.3 cm 22.3 cm Vascular Assessment Pulses: Dorsalis Pedis Palpable: [Left:Yes] [Right:Yes] Posterior Tibial Extremity colors, hair growth, and conditions: Extremity Color: [Left:Hyperpigmented] [Right:Red] Temperature of Extremity: [Left:Warm] [Right:Warm] Capillary Refill: [Left:< 3 seconds] [Right:< 3 seconds] Toe Nail Assessment Left: Right: Thick: Yes Yes Discolored: No No Deformed: No No Improper Length and Hygiene: No No Notes heel to knee- 44cm Electronic Signature(s) Signed: 12/06/2016 2:59:16 PM By: Sydney Schultz Entered By: Sydney Schultz on  12/06/2016 10:31:43 Sydney Schultz, Sydney M. ([id number]) Sydney NaBIGELOW, Sydney LullRIS M. ([id number]) -------------------------------------------------------------------------------- Multi Wound Chart Details Patient Name: Sydney Schultz, Sydney M. Date of Service: 12/06/2016 9:45 AM Medical Record Number: [id number] Patient Account Number: [id number] Date of Birth/Sex: August 20, 1931 [age] y.o. Female) Treating RN: Sydney Schultz Primary Care Dalena Plantz: [facility] Other Clinician: Referring Kamira Mellette: Dewaine OatsATE, Schultz Treating Jiovanny Burdell/Extender: Sydney Schultz in Treatment: 1 Vital Signs Height(in): 59 Pulse(bpm): 78 Weight(lbs): 157 Blood Pressure 151/59 (mmHg): Body Mass Index(BMI): 32 Temperature(F): 97.6 Respiratory Rate 16 (breaths/min): Wound Assessments Treatment Notes Electronic Signature(s) Signed: 12/06/2016 10:28:29 AM By:  Evlyn Kanner MD, FACS Entered By: Evlyn Kanner on 12/06/2016 10:28:29 Sydney Schultz ([id number]) -------------------------------------------------------------------------------- Multi-Disciplinary Care Plan Details Patient Name: [name], [name] Date of Service: 12/06/2016 9:45 AM Medical Record Number: [id number] Patient Account Number: [id number] Date of Birth/Sex: 1932-03-17 (83 y.o. Female) Treating RN: Sydney Cordia, Schultz Primary Care Zadyn Yardley: TATE, [facility] Other Clinician: Referring Nastacia Raybuck: Dewaine Oats Treating Sherrelle Prochazka/Extender: Sydney Re in Treatment: 1 Active Inactive ` Orientation to the Wound Care Program Nursing Diagnoses: Knowledge deficit related to the wound healing center program Goals: Patient/caregiver will verbalize understanding of the Wound Healing Center Program Date Initiated: 11/28/2016 Target Resolution Date: 12/02/2016 Goal Status: Active Interventions: Provide education on orientation to the wound center Notes: ` Wound/Skin Impairment Nursing Diagnoses: Impaired tissue integrity Goals: Ulcer/skin breakdown will have a volume  reduction of 80% by week 12 Date Initiated: 11/28/2016 Target Resolution Date: 03/10/2017 Goal Status: Active Interventions: Assess patient/caregiver ability to perform ulcer/skin care regimen upon admission and as needed Treatment Activities: Skin care regimen initiated : 11/28/2016 Notes: Electronic Signature(s) Signed: 12/06/2016 2:59:16 PM By: Barkley Boards ([id number]) Entered By: Sydney Mulling on 12/06/2016 10:19:52 Sydney Schultz ([id number]) -------------------------------------------------------------------------------- Pain Assessment Details Patient Name: Sydney Schultz. Date of Service: 12/06/2016 9:45 AM Medical Record Number: [id number] Patient Account Number: [id number] Date of Birth/Sex: [birth year] (83 y.o. Female) Treating RN: Sydney Cordia, Schultz Primary Care Elsworth Ledin: Dewaine Oats Other Clinician: Referring Cristi Gwynn: Dewaine Oats Treating Raymondo Garcialopez/Extender: Sydney Re in Treatment: 1 Active Problems Location of Pain Severity and Description of Pain Patient Has Paino Yes Site Locations Pain Location: Pain in Ulcers Rate the pain. Current Pain Level: 4 Character of Pain Describe the Pain: Aching Pain Management and Medication Current Pain Management: Electronic Signature(s) Signed: 12/06/2016 2:59:16 PM By: Sydney Mulling Entered By: Sydney Mulling on 12/06/2016 10:05:48 Sydney Schultz ([id number]) -------------------------------------------------------------------------------- Patient/Caregiver Education Details Patient Name: DENNICE, TINDOL. Date of Service: 12/06/2016 9:45 AM Medical Record Number: [id number] Patient Account Number: [id number] Date of Birth/Gender: 12/03/1931 (84 y.o. Female) Treating RN: Phillis Haggis Primary Care Physician: TATE, [facility] Other Clinician: Referring Physician: Dewaine Oats Treating Physician/Extender: Sydney Re in Treatment: 1 Education Assessment Education Provided  To: Patient Education Topics Provided Wound/Skin Impairment: Handouts: Other: change dressing as ordered Electronic Signature(s) Signed: 12/06/2016 2:59:16 PM By: Sydney Mulling Entered By: Sydney Mulling on 12/06/2016 10:47:12 Dhanani, Sydney Lull ([id number]) -------------------------------------------------------------------------------- Wound Assessment Details Patient Name: KAYCI, BELLEVILLE. Date of Service: 12/06/2016 9:45 AM Medical Record Number: [id number] Patient Account Number: [id number] Date of Birth/Sex: 05/07/1932 (83 y.o. Female) Treating RN: Sydney Cordia, Schultz Primary Care Tramaine Snell: TATE, Katherina Right Other Clinician: Referring Cathlin Buchan: Dewaine Oats Treating Irish Breisch/Extender: Sydney Re in Treatment: 1 Wound Status Wound Number: 1 Primary Lymphedema Etiology: Wound Location: Left Lower Leg - Medial, Anterior Wound Status: Open Wounding Event: Blister Comorbid Cataracts, Lymphedema, Type II History: Diabetes Date Acquired: 11/22/2016 Schultz Of Treatment: 1 Clustered Wound: No Photos Photo Uploaded By: Sydney Mulling on 12/06/2016 13:19:25 Wound Measurements Length: (cm) 0.1 Width: (cm) 0.1 Depth: (cm) 0.1 Area: (cm) 0.008 Volume: (cm) 0.001 % Reduction in Area: 100% % Reduction in Volume: 100% Epithelialization: Large (67-100%) Tunneling: No Undermining: No Wound Description Classification: Unclassifiable Foul Odor A Diabetic Severity (Wagner): Grade 0 Slough/Fibr Wound Margin: Flat and Intact Exudate Amount: None Present fter Cleansing: No ino No Wound Bed Granulation Amount: None Present (0%) Exposed Structure Necrotic Amount: None Present (0%) Fascia Exposed: No Fat Layer (Subcutaneous Tissue) Exposed: No Tendon Exposed: No Beg, Shaylyn M. ([id number]) Muscle Exposed: No  Joint Exposed: No Bone Exposed: No Periwound Skin Texture Texture Color No Abnormalities Noted: No No Abnormalities Noted: No Moisture No Abnormalities Noted:  No Wound Preparation Ulcer Cleansing: Rinsed/Irrigated with Saline Topical Anesthetic Applied: None Treatment Notes Wound #1 (Left, Medial, Anterior Lower Leg) 1. Cleansed with: Clean wound with Normal Saline Cleanse wound with antibacterial soap and water 5. Secondary Dressing Applied Dry Gauze Foam 7. Secured with Tape 3 Layer Compression System - Left Lower Extremity Notes unna to anchor Electronic Signature(s) Signed: 12/06/2016 2:59:16 PM By: Sydney Schultz Entered By: Sydney Schultz on 12/06/2016 10:48:08 Sydney Schultz, Sydney M. ([id number]) -------------------------------------------------------------------------------- Wound Assessment Details Patient Name: Sydney Schultz, Cori M. Date of Service: 12/06/2016 9:45 AM Medical Record Number: [id number] Patient Account Number: [id number] Date of Birth/Sex: 1932-03-30 [age] y.o. Female) Treating RN: Sydney Schultz Primary Care Boleslaus Holloway: TATE, Katherina RightENNY Other Clinician: Referring Pranay Hilbun: Dewaine OatsATE, Schultz Treating Ermal Haberer/Extender: Sydney Schultz in Treatment: 1 Wound Status Wound Number: 2 Primary Lymphedema Etiology: Wound Location: Right Lower Leg - Medial Wound Status: Open Wounding Event: Blister Comorbid Cataracts, Lymphedema, Type II Date Acquired: 11/11/2016 History: Diabetes Schultz Of Treatment: 1 Clustered Wound: No Photos Photo Uploaded By: Sydney Schultz on 12/06/2016 13:19:26 Wound Measurements Length: (cm) 0.1 Width: (cm) 0.1 Depth: (cm) 0.1 Area: (cm) 0.008 Volume: (cm) 0.001 % Reduction in Area: 98% % Reduction in Volume: 97.4% Epithelialization: Large (67-100%) Tunneling: No Undermining: No Wound Description Classification: Unclassifiable Foul Odo Diabetic Severity (Wagner): Grade 0 Slough/F Wound Margin: Indistinct, nonvisible Exudate Amount: None Present r After Cleansing: No ibrino No Wound Bed Granulation Amount: None Present (0%) Exposed Structure Necrotic Amount: None Present (0%) Fascia  Exposed: No Fat Layer (Subcutaneous Tissue) Exposed: No Tendon Exposed: No Muscle Exposed: No Sydney Schultz, Marchetta M. ([id number]) Joint Exposed: No Bone Exposed: No Periwound Skin Texture Texture Color No Abnormalities Noted: No No Abnormalities Noted: No Moisture No Abnormalities Noted: No Wound Preparation Ulcer Cleansing: Rinsed/Irrigated with Saline Topical Anesthetic Applied: None Treatment Notes Wound #2 (Right, Medial Lower Leg) 1. Cleansed with: Clean wound with Normal Saline Cleanse wound with antibacterial soap and water 5. Secondary Dressing Applied Dry Gauze Foam 7. Secured with Tape 3 Layer Compression System - Left Lower Extremity Notes unna to anchor Electronic Signature(s) Signed: 12/06/2016 2:59:16 PM By: Sydney Schultz Entered By: Sydney Schultz on 12/06/2016 10:48:30 Sydney Schultz, Hadassah M. ([id number]) -------------------------------------------------------------------------------- Vitals Details Patient Name: Sydney Schultz, Sophea M. Date of Service: 12/06/2016 9:45 AM Medical Record Number: [id number] Patient Account Number: [id number] Date of Birth/Sex: 1932-03-30 [age] y.o. Female) Treating RN: Sydney Schultz Primary Care Allegra Cerniglia[facility] Other Clinician: Referring Barnell Shieh: Dewaine OatsATE, Schultz Treating Dillian Feig/Extender: Sydney Schultz in Treatment: 1 Vital Signs Time Taken: 10:07 Temperature (F): 97.6 Height (in): 59 Pulse (bpm): 78 Weight (lbs): 157 Respiratory Rate (breaths/min): 16 Body Mass Index (BMI): 31.7 Blood Pressure (mmHg): 151/59 Reference Range: 80 - 120 mg / dl Electronic Signature(s) Signed: 12/06/2016 2:59:16 PM By: Sydney Schultz Entered By: Sydney Schultz on 12/06/2016 10:07:56

## 2016-12-09 NOTE — Progress Notes (Addendum)
Sydney, Schultz ([id number]) Visit Report for 12/06/2016 Chief Complaint Document Details Patient Name: Sydney Schultz, Sydney Schultz. Date of Service: 12/06/2016 9:45 AM Medical Record Number: [id number] Patient Account Number: [id number] Date of Birth/Sex: 1932/01/28 (83 y.o. Female) Treating RN: Ashok Cordia, Debi Primary Care Provider: Dewaine Oats Other Clinician: Referring Provider: Dewaine Oats Treating Provider/Extender: [name]: 1 Information Obtained from: Patient Chief Complaint Compression wrap change due to edema and blisters on the left lower extremity for about 4 weeks Electronic Signature(s) Signed: 12/06/2016 10:28:37 AM By: Evlyn Kanner MD, FACS Entered By: Evlyn Kanner on 12/06/2016 10:28:36 Sydney Folks ([id number]) -------------------------------------------------------------------------------- HPI Details Patient Name: Sydney, Schultz. Date of Service: 12/06/2016 9:45 AM Medical Record Number: [id number] Patient Account Number: [id number] Date of Birth/Sex: 1931/11/11 (83 y.o. Female) Treating RN: Ashok Cordia, Debi Primary Care Provider: Dewaine Oats Other Clinician: Referring Provider: Dewaine Oats Treating Provider/Extender: [name]: 1 History of Present Illness Location: swelling with blisters on the left lower extremity Quality: Patient reports experiencing a dull pain to affected area(s). Severity: Patient states wound are getting worse. Duration: Patient has had the wound for < 4 weeks prior to presenting for treatment Timing: Pain in wound is constant (hurts all the time) Context: The wound would happen gradually Modifying Factors: Other treatment(s) tried include:Lasix, Keflex and referred to the wound care center Associated Signs and Symptoms: Patient reports having increase swelling. HPI Description: this 83 year old patient was recently seen in the ER for a blister noted to both lower extremities and also  complained of swelling in the feet.her past medical history significant for diabetes mellitus, hypertension, status post left foot surgery, bilateral knee surgery and right shoulder surgery. She has never been a smoker. during her a ER visit there was no evidence of DVT both lower extremities and her right foot x-ray showed no acute osseous abnormality. fluoroscopy edema she was prescribed Lasix and because of some erythema on the right foot she is prescribed Keflex and was given a dose of vancomycin. She was offered an appointment at the wound clinic for outpatient follow-up. the patient has a past medical history of anemia, atherosclerotic heart disease, diabetes mellitus type 2 uncomplicated, hyperlipidemia, obesity, osteoarthritis, sleep apnea, status post hysterectomy, status post bilateral knee replacements, cardiac catheterization,tendon repair in the left foot. Electronic Signature(s) Signed: 12/06/2016 10:28:42 AM By: Evlyn Kanner MD, FACS Entered By: Evlyn Kanner on 12/06/2016 10:28:41 Sydney Folks ([id number]) -------------------------------------------------------------------------------- Physical Exam Details Patient Name: Sydney, Schultz. Date of Service: 12/06/2016 9:45 AM Medical Record Number: [id number] Patient Account Number: [id number] Date of Birth/Sex: 01-24-1932 (83 y.o. Female) Treating RN: Ashok Cordia, Debi Primary Care Provider: Dewaine Oats Other Clinician: Referring Provider: Dewaine Oats Treating Provider/Extender: [name]: 1 Constitutional . Pulse regular. Respirations normal and unlabored. Afebrile. . Eyes Nonicteric. Reactive to light. Ears, Nose, Mouth, and Throat Lips, teeth, and gums WNL.Marland Kitchen Moist mucosa without lesions. Neck supple and nontender. No palpable supraclavicular or cervical adenopathy. Normal sized without goiter. Respiratory WNL. No retractions.. Cardiovascular Pedal Pulses WNL. No clubbing, cyanosis or  edema. Lymphatic No adneopathy. No adenopathy. No adenopathy. Musculoskeletal Adexa without tenderness or enlargement.. Digits and nails w/o clubbing, cyanosis, infection, petechiae, ischemia, or inflammatory conditions.. Integumentary (Hair, Skin) No suspicious lesions. No crepitus or fluctuance. No peri-wound warmth or erythema. No masses.Marland Kitchen Psychiatric Judgement and insight Intact.. No evidence of depression, anxiety, or agitation.. Notes in the lymphedema on the left lower extremity is looking much better and the blisters and ulceration have  gotten healthy epithelialization. Electronic Signature(s) Signed: 12/06/2016 10:29:54 AM By: Evlyn KannerBritto, Judyth Demarais MD, FACS Previous Signature: 12/06/2016 10:29:19 AM Version By: Evlyn KannerBritto, Kyomi Hector MD, FACS Entered By: Evlyn KannerBritto, Izabell Schalk on 12/06/2016 10:29:54 Sydney Schultz, Lucerito M. ([id number]) -------------------------------------------------------------------------------- Physician Orders Details Patient Name: Sydney Schultz, Deondra M. Date of Service: 12/06/2016 9:45 AM Medical Record Number: [id number] Patient Account Number: [id number] Date of Birth/Sex: [birth year](84 y.o. Female) Treating RN: Ashok CordiaPinkerton, Debi Primary Care Provider: Dewaine OatsATE, DENNY Other Clinician: Referring Provider: Dewaine OatsATE, DENNY Treating Provider/Extender: Rudene ReBritto, Allyce Bochicchio Weeks in Treatment: 1 Verbal / Phone Orders: Yes ClinicianAshok Cordia: Pinkerton, Debi Read Back and Verified: Yes Diagnosis Coding ICD-10 Coding Code Description E11.622 Type 2 diabetes mellitus with other skin ulcer I89.0 Lymphedema, not elsewhere classified M14.671 Charcot's joint, right ankle and foot M14.672 Charcot's joint, left ankle and foot Wound Cleansing Wound #1 Left,Medial,Anterior Lower Leg o Clean wound with Normal Saline. o Cleanse wound with mild soap and water Wound #2 Right,Medial Lower Leg o Clean wound with Normal Saline. o Cleanse wound with mild soap and water Secondary Dressing Wound #1 Left,Medial,Anterior  Lower Leg o Dry Gauze o Foam Wound #2 Right,Medial Lower Leg o Dry Gauze o Foam Dressing Change Frequency Wound #1 Left,Medial,Anterior Lower Leg o Change dressing every week Wound #2 Right,Medial Lower Leg o Change dressing every week Follow-up Appointments Wound #1 Left,Medial,Anterior Lower Leg o Return Appointment in 1 week. Sydney Schultz, Kaedynce .. ([id number]) Wound #2 Right,Medial Lower Leg o Return Appointment in 1 week. Edema Control Wound #1 Left,Medial,Anterior Lower Leg o 3 Layer Compression System - Left Lower Extremity - unna to anchor o Elevate legs to the level of the heart and pump ankles as often as possible Wound #2 Right,Medial Lower Leg o 3 Layer Compression System - Left Lower Extremity - unna to anchor o Elevate legs to the level of the heart and pump ankles as often as possible Additional Orders / Instructions Wound #1 Left,Medial,Anterior Lower Leg o Increase protein intake. Wound #2 Right,Medial Lower Leg o Increase protein intake. Electronic Signature(s) Signed: 12/06/2016 2:59:16 PM By: Alejandro MullingPinkerton, Debra Signed: 12/06/2016 3:58:43 PM By: Evlyn KannerBritto, Daiton Cowles MD, FACS Previous Signature: 12/06/2016 10:30:00 AM Version By: Evlyn KannerBritto, Kyrstin Campillo MD, FACS Entered By: Alejandro MullingPinkerton, Debra on 12/06/2016 10:43:10 Sydney Schultz, Netty M. ([id number]) -------------------------------------------------------------------------------- Problem List Details Patient Name: Sydney Schultz, Safia M. Date of Service: 12/06/2016 9:45 AM Medical Record Number: [id number] Patient Account Number: [id number] Date of Birth/Sex: [birth year](84 y.o. Female) Treating RN: Phillis HaggisPinkerton, Debi Primary Care Provider: Dewaine OatsATE, DENNY Other Clinician: Referring Provider: Dewaine OatsATE, DENNY Treating Provider/Extender: Rudene ReBritto, Tonica Brasington Weeks in Treatment: 1 Active Problems ICD-10 Encounter Code Description Active Date Diagnosis E11.622 Type 2 diabetes mellitus with other skin ulcer 11/28/2016 Yes I89.0  Lymphedema, not elsewhere classified 11/28/2016 Yes M14.671 Charcot's joint, right ankle and foot 11/28/2016 Yes M14.672 Charcot's joint, left ankle and foot 11/28/2016 Yes Inactive Problems Resolved Problems Electronic Signature(s) Signed: 12/06/2016 10:28:16 AM By: Evlyn KannerBritto, Grayson Pfefferle MD, FACS Entered By: Evlyn KannerBritto, Yuridiana Formanek on 12/06/2016 10:28:15 Sydney Schultz, Amalia M. ([id number]) -------------------------------------------------------------------------------- Progress Note Details Patient Name: Sydney Schultz, Gennette M. Date of Service: 12/06/2016 9:45 AM Medical Record Number: [id number] Patient Account Number: [id number] Date of Birth/Sex: [birth year](84 y.o. Female) Treating RN: Ashok CordiaPinkerton, Debi Primary Care Provider: Dewaine OatsATE, DENNY Other Clinician: Referring Provider: Dewaine OatsATE, DENNY Treating Provider/Extender: Rudene ReBritto, Santrice Muzio Weeks in Treatment: 1 Subjective Chief Complaint Information obtained from Patient Compression wrap change due to edema and blisters on the left lower extremity for about 4 weeks History of Present Illness (HPI) The following HPI elements were documented for the patient's wound: Location:  swelling with blisters on the left lower extremity Quality: Patient reports experiencing a dull pain to affected area(s). Severity: Patient states wound are getting worse. Duration: Patient has had the wound for < 4 weeks prior to presenting for treatment Timing: Pain in wound is constant (hurts all the time) Context: The wound would happen gradually Modifying Factors: Other treatment(s) tried include:Lasix, Keflex and referred to the wound care center Associated Signs and Symptoms: Patient reports having increase swelling. this 83 year old patient was recently seen in the ER for a blister noted to both lower extremities and also complained of swelling in the feet.her past medical history significant for diabetes mellitus, hypertension, status post left foot surgery, bilateral knee surgery and right  shoulder surgery. She has never been a smoker. during her a ER visit there was no evidence of DVT both lower extremities and her right foot x-ray showed no acute osseous abnormality. fluoroscopy edema she was prescribed Lasix and because of some erythema on the right foot she is prescribed Keflex and was given a dose of vancomycin. She was offered an appointment at the wound clinic for outpatient follow-up. the patient has a past medical history of anemia, atherosclerotic heart disease, diabetes mellitus type 2 uncomplicated, hyperlipidemia, obesity, osteoarthritis, sleep apnea, status post hysterectomy, status post bilateral knee replacements, cardiac catheterization,tendon repair in the left foot. Objective Constitutional Pulse regular. Respirations normal and unlabored. Afebrile. PAIDYN, MCFERRAN. ([id number]) Vitals Time Taken: 10:07 AM, Height: 59 in, Weight: 157 lbs, BMI: 31.7, Temperature: 97.6 F, Pulse: 78 bpm, Respiratory Rate: 16 breaths/min, Blood Pressure: 151/59 mmHg. Eyes Nonicteric. Reactive to light. Ears, Nose, Mouth, and Throat Lips, teeth, and gums WNL.Marland Kitchen Moist mucosa without lesions. Neck supple and nontender. No palpable supraclavicular or cervical adenopathy. Normal sized without goiter. Respiratory WNL. No retractions.. Cardiovascular Pedal Pulses WNL. No clubbing, cyanosis or edema. Lymphatic No adneopathy. No adenopathy. No adenopathy. Musculoskeletal Adexa without tenderness or enlargement.. Digits and nails w/o clubbing, cyanosis, infection, petechiae, ischemia, or inflammatory conditions.Marland Kitchen Psychiatric Judgement and insight Intact.. No evidence of depression, anxiety, or agitation.. General Notes: in the lymphedema on the left lower extremity is looking much better and the blisters and ulceration have gotten healthy epithelialization. Integumentary (Hair, Skin) No suspicious lesions. No crepitus or fluctuance. No peri-wound warmth or erythema. No  masses.. Wound #1 status is Open. Original cause of wound was Blister. The wound is located on the Left,Medial,Anterior Lower Leg. The wound measures 0.1cm length x 0.1cm width x 0.1cm depth; 0.008cm^2 area and 0.001cm^3 volume. There is no tunneling or undermining noted. There is a none present amount of drainage noted. The wound margin is flat and intact. There is no granulation within the wound bed. There is no necrotic tissue within the wound bed. Wound #2 status is Open. Original cause of wound was Blister. The wound is located on the Right,Medial Lower Leg. The wound measures 0.1cm length x 0.1cm width x 0.1cm depth; 0.008cm^2 area and 0.001cm^3 volume. There is no tunneling or undermining noted. There is a none present amount of drainage noted. The wound margin is indistinct and nonvisible. There is no granulation within the wound bed. There is no necrotic tissue within the wound bed. EARMA, NICOLAOU ([id number]) Assessment Active Problems ICD-10 E11.622 - Type 2 diabetes mellitus with other skin ulcer I89.0 - Lymphedema, not elsewhere classified M14.671 - Charcot's joint, right ankle and foot M14.672 - Charcot's joint, left ankle and foot Plan Wound Cleansing: Wound #1 Left,Medial,Anterior Lower Leg: Clean wound with Normal Saline. Cleanse  wound with mild soap and water Wound #2 Right,Medial Lower Leg: Clean wound with Normal Saline. Cleanse wound with mild soap and water Secondary Dressing: Wound #1 Left,Medial,Anterior Lower Leg: Dry Gauze Foam Wound #2 Right,Medial Lower Leg: Dry Gauze Foam Dressing Change Frequency: Wound #1 Left,Medial,Anterior Lower Leg: Change dressing every week Wound #2 Right,Medial Lower Leg: Change dressing every week Follow-up Appointments: Wound #1 Left,Medial,Anterior Lower Leg: Return Appointment in 1 week. Wound #2 Right,Medial Lower Leg: Return Appointment in 1 week. Edema Control: Wound #1 Left,Medial,Anterior Lower Leg: 3  Layer Compression System - Left Lower Extremity - unna to anchor Elevate legs to the level of the heart and pump ankles as often as possible Wound #2 Right,Medial Lower Leg: 3 Layer Compression System - Left Lower Extremity - unna to anchor Elevate legs to the level of the heart and pump ankles as often as possible Additional Orders / Instructions: Wound #1 Left,Medial,Anterior Lower Leg: Sydney Schultz, Elverna M. ([id number]) Increase protein intake. Wound #2 Right,Medial Lower Leg: Increase protein intake. After review I have recommended: 1. elevation and exercise 2. foam with a 3 layer Profore wrap to the left lower extremity. 3. I have recommended for her to get 20-30 mm compression stockings so as to help control of her lymphedema once she is discharged 4. Adequate control of her lymphedema with appropriate diuretics as prescribed 5. Good control of her diabetes mellitus 6. Regular visits to the wound center Electronic Signature(s) Signed: 12/06/2016 4:04:56 PM By: Evlyn KannerBritto, Gustav Knueppel MD, FACS Previous Signature: 12/06/2016 10:30:41 AM Version By: Evlyn KannerBritto, Jamee Pacholski MD, FACS Entered By: Evlyn KannerBritto, Linnae Rasool on 12/06/2016 16:04:55 Sydney Schultz, Khianna M. ([id number]) -------------------------------------------------------------------------------- SuperBill Details Patient Name: Sydney Schultz, Jourdyn M. Date of Service: 12/06/2016 Medical Record Number: [id number] Patient Account Number: [id number] Date of Birth/Sex: [birth year] 7(84 y.o. Female) Treating RN: Ashok CordiaPinkerton, Debi Primary Care Provider: Dewaine OatsATE, DENNY Other Clinician: Referring Provider: Dewaine OatsATE, DENNY Treating Provider/Extender: Rudene ReBritto, Tycen Dockter Weeks in Treatment: 1 Diagnosis Coding ICD-10 Codes Code Description E11.622 Type 2 diabetes mellitus with other skin ulcer I89.0 Lymphedema, not elsewhere classified M14.671 Charcot's joint, right ankle and foot M14.672 Charcot's joint, left ankle and foot Facility Procedures CPT4: Description Modifier Quantity Code  [id number] (Facility Use Only) [id number] - APPLY MULTLAY COMPRS LWR LT 1 LEG Physician Procedures CPT4 Code: [id number] Description: 99213 - WC PHYS LEVEL 3 - EST PT ICD-10 Description Diagnosis E11.622 Type 2 diabetes mellitus with other skin ulcer M14.671 Charcot's joint, right ankle and foot I89.0 Lymphedema, not elsewhere classified M14.672 Charcot's joint, left ankle  and foot Modifier: Quantity: 1 Electronic Signature(s) Signed: 12/06/2016 2:59:16 PM By: Alejandro MullingPinkerton, Debra Signed: 12/06/2016 3:58:43 PM By: Evlyn KannerBritto, Daelan Gatt MD, FACS Previous Signature: 12/06/2016 10:32:20 AM Version By: Evlyn KannerBritto, Antonela Freiman MD, FACS Entered By: Alejandro MullingPinkerton, Debra on 12/06/2016 10:47:35

## 2016-12-13 ENCOUNTER — Encounter: Payer: Medicare Other | Attending: Surgery | Admitting: Surgery

## 2016-12-13 DIAGNOSIS — Z96653 Presence of artificial knee joint, bilateral: Secondary | ICD-10-CM | POA: Diagnosis not present

## 2016-12-13 DIAGNOSIS — M14671 Charcot's joint, right ankle and foot: Secondary | ICD-10-CM | POA: Insufficient documentation

## 2016-12-13 DIAGNOSIS — M14672 Charcot's joint, left ankle and foot: Secondary | ICD-10-CM | POA: Diagnosis not present

## 2016-12-13 DIAGNOSIS — E785 Hyperlipidemia, unspecified: Secondary | ICD-10-CM | POA: Diagnosis not present

## 2016-12-13 DIAGNOSIS — E11622 Type 2 diabetes mellitus with other skin ulcer: Secondary | ICD-10-CM | POA: Insufficient documentation

## 2016-12-13 DIAGNOSIS — I2510 Atherosclerotic heart disease of native coronary artery without angina pectoris: Secondary | ICD-10-CM | POA: Diagnosis not present

## 2016-12-13 DIAGNOSIS — I509 Heart failure, unspecified: Secondary | ICD-10-CM | POA: Insufficient documentation

## 2016-12-13 DIAGNOSIS — Z79899 Other long term (current) drug therapy: Secondary | ICD-10-CM | POA: Diagnosis not present

## 2016-12-13 DIAGNOSIS — M1990 Unspecified osteoarthritis, unspecified site: Secondary | ICD-10-CM | POA: Insufficient documentation

## 2016-12-13 DIAGNOSIS — I890 Lymphedema, not elsewhere classified: Secondary | ICD-10-CM | POA: Insufficient documentation

## 2016-12-13 DIAGNOSIS — Z7984 Long term (current) use of oral hypoglycemic drugs: Secondary | ICD-10-CM | POA: Diagnosis not present

## 2016-12-13 DIAGNOSIS — G4730 Sleep apnea, unspecified: Secondary | ICD-10-CM | POA: Diagnosis not present

## 2016-12-13 DIAGNOSIS — D649 Anemia, unspecified: Secondary | ICD-10-CM | POA: Insufficient documentation

## 2016-12-13 DIAGNOSIS — I130 Hypertensive heart and chronic kidney disease with heart failure and stage 1 through stage 4 chronic kidney disease, or unspecified chronic kidney disease: Secondary | ICD-10-CM | POA: Diagnosis not present

## 2016-12-13 DIAGNOSIS — Z7982 Long term (current) use of aspirin: Secondary | ICD-10-CM | POA: Diagnosis not present

## 2016-12-13 DIAGNOSIS — N189 Chronic kidney disease, unspecified: Secondary | ICD-10-CM | POA: Insufficient documentation

## 2016-12-15 NOTE — Progress Notes (Signed)
Sydney Schultz, Sydney Schultz ([id number]) Visit Report for 12/13/2016 Arrival Information Details Patient Name: Sydney Schultz, Sydney Schultz. Date of Service: 12/13/2016 3:30 PM Medical Record Number: [id number] Patient Account Number: [id number] Date of Birth/Sex: 1932/02/04 (83 y.o. Female) Treating RN: Ashok Cordia, Debi Primary Care Kenia Teagarden: TATE, [facility] Other Clinician: Referring Ivry Pigue: Dewaine Oats Treating Zamariah Seaborn/Extender: [name]: 2 Visit Information History Since Last Visit All ordered tests and consults were completed: No Patient Arrived: Wheel Chair Added or deleted any medications: No Arrival Time: 15:56 Any new allergies or adverse reactions: No Accompanied By: daughter Had a fall or experienced change in No Transfer Assistance: EasyPivot Patient activities of daily living that may affect Lift risk of falls: Patient Identification Verified: Yes Signs or symptoms of abuse/neglect since last No Secondary Verification Process Yes visito Completed: Hospitalized since last visit: No Patient Requires Transmission- No Has Dressing in Place as Prescribed: Yes Based Precautions: Has Compression in Place as Prescribed: Yes Patient Has Alerts: Yes Pain Present Now: No Patient Alerts: Patient on Blood Thinner 81 mg Aspirin; Plavix DM II Electronic Signature(s) Signed: 12/13/2016 4:34:22 PM By: Alejandro Mulling Entered By: Alejandro Mulling on 12/13/2016 15:58:24 Swart, Sydney Schultz ([id number]) -------------------------------------------------------------------------------- Encounter Discharge Information Details Patient Name: Sydney Schultz, Sydney Schultz. Date of Service: 12/13/2016 3:30 PM Medical Record Number: [id number] Patient Account Number: [id number] Date of Birth/Sex: [birth year] (83 y.o. Female) Treating RN: Ashok Cordia, Debi Primary Care Reianna Batdorf: TATE, [facility] Other Clinician: Referring Seydou Hearns: Dewaine Oats Treating Braidan Ricciardi/Extender: [name]:  2 Encounter Discharge Information Items Discharge Pain Level: 0 Discharge Condition: Stable Ambulatory Status: Wheelchair Discharge Destination: Home Transportation: Private Auto Accompanied By: daughter Schedule Follow-up Appointment: Yes Medication Reconciliation completed No and provided to Patient/Care Gila Lauf: Provided on Clinical Summary of Care: 12/13/2016 Form Type Recipient Paper Patient DB Electronic Signature(s) Signed: 12/13/2016 4:24:00 PM By: Gwenlyn Perking Entered By: Gwenlyn Perking on 12/13/2016 16:24:00 Sydney Schultz ([id number]) -------------------------------------------------------------------------------- Lower Extremity Assessment Details Patient Name: Sydney Schultz. Date of Service: 12/13/2016 3:30 PM Medical Record Number: [id number] Patient Account Number: [id number] Date of Birth/Sex: Mar 09, 1932 (83 y.o. Female) Treating RN: Ashok Cordia, Debi Primary Care Davionna Blacksher: TATE, [facility]ather[facility] Right Other Clinician: Referring Jayven Naill: Dewaine Oats Treating Quaran Kedzierski/Extender: [name]: 2 Edema Assessment Assessed: [Left: No] [Right: No] E[Left: dema] [Right: :] Calf Left: Right: Point of Measurement: 30 cm From Medial Instep 33.7 cm cm Ankle Left: Right: Point of Measurement: 10 cm From Medial Instep 20.3 cm cm Vascular Assessment Pulses: Dorsalis Pedis Palpable: [Left:Yes] Posterior Tibial Extremity colors, hair growth, and conditions: Extremity Color: [Left:Hyperpigmented] Temperature of Extremity: [Left:Warm] Capillary Refill: [Left:< 3 seconds] Toe Nail Assessment Left: Right: Thick: Yes Discolored: No Deformed: No Improper Length and Hygiene: No Electronic Signature(s) Signed: 12/13/2016 4:34:22 PM By: Alejandro Mulling Entered By: Alejandro Mulling on 12/13/2016 16:05:59 Sundby, Sydney Schultz ([id number]) -------------------------------------------------------------------------------- Multi Wound Chart Details Patient Name: Sydney Schultz. Date of Service: 12/13/2016 3:30 PM Medical Record Number: [id number] Patient Account Number: [id number] Date of Birth/Sex: [birth year] (83 y.o. Female) Treating RN: Ashok Cordia, Debi Primary Care Roldan Laforest: Dewaine Oats Other Clinician: Referring Alycea Segoviano: Dewaine Oats Treating Jafet Wissing/Extender: [name]: 2 Vital Signs Height(in): 59 Pulse(bpm): 78 Weight(lbs): 157 Blood Pressure 133/69 (mmHg): Body Mass Index(BMI): 32 Temperature(F): 98.0 Respiratory Rate 16 (breaths/min): Photos: [1:No Photos] [2:No Photos] [N/A:N/A] Wound Location: [1:Left Lower Leg - Medial, Anterior] [2:Right Lower Leg - Medial N/A] Wounding Event: [1:Blister] [2:Blister] [N/A:N/A] Primary Etiology: [1:Lymphedema] [2:Lymphedema] [N/A:N/A] Comorbid History: [1:Cataracts, Lymphedema, Type II Diabetes] [2:Cataracts, Lymphedema, Type  II Diabetes] [N/A:N/A] Date Acquired: [1:11/22/2016] [2:11/11/2016] [N/A:N/A] Weeks of Treatment: [1:2] [2:2] [N/A:N/A] Wound Status: [1:Open] [2:Healed - Epithelialized] [N/A:N/A] Measurements L x W x D 0.1x0.1x0.1 [2:0x0x0] [N/A:N/A] (cm) Area (cm) : [1:0] [2:0] [N/A:N/A] Volume (cm) : [1:0] [2:0] [N/A:N/A] % Reduction in Area: [1:100.00%] [2:100.00%] [N/A:N/A] % Reduction in Volume: 100.00% [2:100.00%] [N/A:N/A] Classification: [1:Unclassifiable] [2:Unclassifiable] [N/A:N/A] HBO Classification: [1:Grade 0] [2:Grade 0] [N/A:N/A] Exudate Amount: [1:None Present] [2:None Present] [N/A:N/A] Wound Margin: [1:Flat and Intact] [2:Flat and Intact] [N/A:N/A] Granulation Amount: [1:None Present (0%)] [2:None Present (0%)] [N/A:N/A] Necrotic Amount: [1:None Present (0%)] [2:None Present (0%)] [N/A:N/A] Epithelialization: [1:Large (67-100%)] [2:Large (67-100%)] [N/A:N/A] Periwound Skin Texture: No Abnormalities Noted [2:No Abnormalities Noted] [N/A:N/A] Periwound Skin [1:No Abnormalities Noted] [2:No Abnormalities Noted] [N/A:N/A] Moisture: Periwound Skin  Color: No Abnormalities Noted [2:No Abnormalities Noted] [N/A:N/A] Temperature: [1:N/A No] [2:No Abnormality No] [N/A:N/A N/A] Tenderness on Palpation: Wound Preparation: Ulcer Cleansing: Ulcer Cleansing: N/A Rinsed/Irrigated with Rinsed/Irrigated with Saline Saline Topical Anesthetic Topical Anesthetic Applied: None Applied: None Treatment Notes Electronic Signature(s) Signed: 12/13/2016 4:11:36 PM By: Evlyn KannerBritto, Errol MD, FACS Entered By: Evlyn KannerBritto, Errol on 12/13/2016 16:11:36 Sydney Schultz, Sydney Schultz. ([id number]) -------------------------------------------------------------------------------- Multi-Disciplinary Care Plan Details Patient Name: Sydney Schultz, Sydney Schultz. Date of Service: 12/13/2016 3:30 PM Medical Record Number: [id number] Patient Account Number: [id number] Date of Birth/Sex: 12/07/1931 [age] y.o. Female) Treating RN: Ashok CordiaPinkerton, Debi Primary Care Cortavious Nix[facility] Other Clinician: Referring Narek Kniss: Dewaine OatsATE, DENNY Treating Keimari /Extender: Rudene ReBritto, Errol Weeks in Treatment: 2 Active Inactive ` Orientation to the Wound Care Program Nursing Diagnoses: Knowledge deficit related to the wound healing center program Goals: Patient/caregiver will verbalize understanding of the Wound Healing Center Program Date Initiated: 11/28/2016 Target Resolution Date: 12/02/2016 Goal Status: Active Interventions: Provide education on orientation to the wound center Notes: ` Wound/Skin Impairment Nursing Diagnoses: Impaired tissue integrity Goals: Ulcer/skin breakdown will have a volume reduction of 80% by week 12 Date Initiated: 11/28/2016 Target Resolution Date: 03/10/2017 Goal Status: Active Interventions: Assess patient/caregiver ability to perform ulcer/skin care regimen upon admission and as needed Treatment Activities: Skin care regimen initiated : 11/28/2016 Notes: Electronic Signature(s) Signed: 12/13/2016 4:34:22 PM By: Barkley BoardsPinkerton, Debra Forton, Geraldene Schultz. ([id number]) Entered By:  Alejandro MullingPinkerton, Debra on 12/13/2016 16:09:45 Sydney Schultz, Jadelin Schultz. ([id number]) -------------------------------------------------------------------------------- Pain Assessment Details Patient Name: Sydney Schultz, Sydney Schultz. Date of Service: 12/13/2016 3:30 PM Medical Record Number: [id number] Patient Account Number: [id number] Date of Birth/Sex: 12/07/1931 [age] y.o. Female) Treating RN: Ashok CordiaPinkerton, Debi Primary Care Lester Platas: Dewaine OatsATE, DENNY Other Clinician: Referring Randi Poullard: Dewaine OatsATE, DENNY Treating Vahe Pienta/Extender: Rudene ReBritto, Errol Weeks in Treatment: 2 Active Problems Location of Pain Severity and Description of Pain Patient Has Paino No Site Locations With Dressing Change: No Pain Management and Medication Current Pain Management: Electronic Signature(s) Signed: 12/13/2016 4:34:22 PM By: Alejandro MullingPinkerton, Debra Entered By: Alejandro MullingPinkerton, Debra on 12/13/2016 15:59:12 Sydney Schultz, Sydney Schultz. ([id number]) -------------------------------------------------------------------------------- Patient/Caregiver Education Details Patient Name: Sydney Schultz, Sydney Schultz. Date of Service: 12/13/2016 3:30 PM Medical Record Number: [id number] Patient Account Number: [id number] Date of Birth/Gender: 12/07/1931 [age] y.o. Female) Treating RN: Ashok CordiaPinkerton, Debi Primary [facility] Other Clinician: Referring Physician: Dewaine OatsATE, DENNY Treating Physician/Extender: Rudene ReBritto, Errol Weeks in Treatment: 2 Education Assessment Education Provided To: Patient Education Topics Provided Wound/Skin Impairment: Handouts: Other: change dressing as ordered Methods: Demonstration, Explain/Verbal Responses: State content correctly Electronic Signature(s) Signed: 12/13/2016 4:34:22 PM By: Alejandro MullingPinkerton, Debra Entered By: Alejandro MullingPinkerton, Debra on 12/13/2016 16:13:34 Vanderzanden, Sydney LullRIS Schultz. ([id number]) -------------------------------------------------------------------------------- Wound Assessment Details Patient Name: Sydney Schultz, Sydney Schultz. Date of Service: 12/13/2016 3:30  PM Medical Record Number: [id number] Patient Account Number: [id number]  Date of Birth/Sex: July 13, 1931 [age] y.o. Female) Treating RN: Ashok CordiaPinkerton, Debi Primary Care Maribelle Hopple: TATE, Katherina RightENNY Other Clinician: Referring Zymire Turnbo: Dewaine OatsATE, DENNY Treating Denissa Cozart/Extender: Rudene ReBritto, Errol Weeks in Treatment: 2 Wound Status Wound Number: 1 Primary Lymphedema Etiology: Wound Location: Left Lower Leg - Medial, Anterior Wound Status: Open Wounding Event: Blister Comorbid Cataracts, Lymphedema, Type II History: Diabetes Date Acquired: 11/22/2016 Weeks Of Treatment: 2 Clustered Wound: No Photos Photo Uploaded By: Alejandro MullingPinkerton, Debra on 12/13/2016 16:32:17 Wound Measurements Length: (cm) 0.1 Width: (cm) 0.1 Depth: (cm) 0.1 Area: (cm) 0 Volume: (cm) 0 % Reduction in Area: 100% % Reduction in Volume: 100% Epithelialization: Large (67-100%) Tunneling: No Undermining: No Wound Description Classification: Unclassifiable Foul Odor A Diabetic Severity (Wagner): Grade 0 Slough/Fibr Wound Margin: Flat and Intact Exudate Amount: None Present fter Cleansing: No ino No Wound Bed Granulation Amount: None Present (0%) Necrotic Amount: None Present (0%) Periwound Skin Texture Texture Color Sydney Schultz, Tera Schultz. ([id number]) No Abnormalities Noted: No No Abnormalities Noted: No Moisture No Abnormalities Noted: No Wound Preparation Ulcer Cleansing: Rinsed/Irrigated with Saline Topical Anesthetic Applied: None Treatment Notes Wound #1 (Left, Medial, Anterior Lower Leg) 1. Cleansed with: Clean wound with Normal Saline Cleanse wound with antibacterial soap and water 7. Secured with Tape 3 Layer Compression System - Left Lower Extremity Notes unna to anchor Electronic Signature(s) Signed: 12/13/2016 4:34:22 PM By: Alejandro MullingPinkerton, Debra Entered By: Alejandro MullingPinkerton, Debra on 12/13/2016 16:04:13 Sydney Schultz, Shatha Schultz. ([id number]) -------------------------------------------------------------------------------- Wound  Assessment Details Patient Name: Sydney Schultz, Gennesis Schultz. Date of Service: 12/13/2016 3:30 PM Medical Record Number: [id number] Patient Account Number: [id number] Date of Birth/Sex: July 13, 1931 [age] y.o. Female) Treating RN: Ashok CordiaPinkerton, Debi Primary Care Hilmer Aliberti: TATE, Katherina RightENNY Other Clinician: Referring Jamael Hoffmann: Dewaine OatsATE, DENNY Treating Spruha Weight/Extender: Rudene ReBritto, Errol Weeks in Treatment: 2 Wound Status Wound Number: 2 Primary Lymphedema Etiology: Wound Location: Right Lower Leg - Medial Wound Status: Healed - Epithelialized Wounding Event: Blister Comorbid Cataracts, Lymphedema, Type II Date Acquired: 11/11/2016 History: Diabetes Weeks Of Treatment: 2 Clustered Wound: No Photos Photo Uploaded By: Alejandro MullingPinkerton, Debra on 12/13/2016 16:32:18 Wound Measurements Length: (cm) 0 % Reduction Width: (cm) 0 % Reduction Depth: (cm) 0 Epitheliali Area: (cm) 0 Tunneling: Volume: (cm) 0 Underminin in Area: 100% in Volume: 100% zation: Large (67-100%) No g: No Wound Description Classification: Unclassifiable Foul Odor A Diabetic Severity (Wagner): Grade 0 Slough/Fibr Wound Margin: Flat and Intact Exudate Amount: None Present fter Cleansing: No ino No Wound Bed Granulation Amount: None Present (0%) Necrotic Amount: None Present (0%) Periwound Skin Texture Texture Color No Abnormalities Noted: No No Abnormalities Noted: No Sydney Schultz, Dhanya Schultz. ([id number]) Moisture Temperature / Pain No Abnormalities Noted: No Temperature: No Abnormality Wound Preparation Ulcer Cleansing: Rinsed/Irrigated with Saline Topical Anesthetic Applied: None Electronic Signature(s) Signed: 12/13/2016 4:34:22 PM By: Alejandro MullingPinkerton, Debra Entered By: Alejandro MullingPinkerton, Debra on 12/13/2016 16:04:53 Ellenbecker, Sydney LullRIS Schultz. ([id number]) -------------------------------------------------------------------------------- Vitals Details Patient Name: Sydney Schultz, Elzora Schultz. Date of Service: 12/13/2016 3:30 PM Medical Record Number: [id number] Patient  Account Number: [id number] Date of Birth/Sex: July 13, 1931 [age] y.o. Female) Treating RN: Ashok CordiaPinkerton, Debi Primary Care Lanise Mergen[facility] Other Clinician: Referring Earle Burson: Dewaine OatsATE, DENNY Treating Lacie Landry/Extender: Rudene ReBritto, Errol Weeks in Treatment: 2 Vital Signs Time Taken: 15:59 Temperature (F): 98.0 Height (in): 59 Pulse (bpm): 78 Weight (lbs): 157 Respiratory Rate (breaths/min): 16 Body Mass Index (BMI): 31.7 Blood Pressure (mmHg): 133/69 Reference Range: 80 - 120 mg / dl Electronic Signature(s) Signed: 12/13/2016 4:34:22 PM By: Alejandro MullingPinkerton, Debra Entered By: Alejandro MullingPinkerton, Debra on 12/13/2016 16:01:34

## 2016-12-15 NOTE — Progress Notes (Signed)
BRITINI, GARCILAZO ([id number]) Visit Report for 12/13/2016 Chief Complaint Document Details Patient Name: Sydney Schultz, Sydney Schultz. Date of Service: 12/13/2016 3:30 PM Medical Record Number: [id number] Patient Account Number: [id number] Date of Birth/Sex: Sep 14, 1931 (83 y.o. Female) Treating RN: Ashok Cordia, Debi Primary Care Provider: Dewaine Oats Other Clinician: Referring Provider: Dewaine Oats Treating Provider/Extender: [name]: 2 Information Obtained from: Patient Chief Complaint Compression wrap change due to edema and blisters on the left lower extremity for about 4 weeks Electronic Signature(s) Signed: 12/13/2016 4:11:42 PM By: Evlyn Kanner MD, FACS Entered By: Evlyn Kanner on 12/13/2016 16:11:42 Sydney Schultz ([id number]) -------------------------------------------------------------------------------- HPI Details Patient Name: Sydney Schultz. Date of Service: 12/13/2016 3:30 PM Medical Record Number: [id number] Patient Account Number: [id number] Date of Birth/Sex: 08-08-1931 (83 y.o. Female) Treating RN: Ashok Cordia, Debi Primary Care Provider: Dewaine Oats Other Clinician: Referring Provider: Dewaine Oats Treating Provider/Extender: [name]: 2 History of Present Illness Location: swelling with blisters on the left lower extremity Quality: Patient reports experiencing a dull pain to affected area(s). Severity: Patient states wound are getting worse. Duration: Patient has had the wound for < 4 weeks prior to presenting for treatment Timing: Pain in wound is constant (hurts all the time) Context: The wound would happen gradually Modifying Factors: Other treatment(s) tried include:Lasix, Keflex and referred to the wound care center Associated Signs and Symptoms: Patient reports having increase swelling. HPI Description: this 83 year old patient was recently seen in the ER for a blister noted to both lower extremities and also complained  of swelling in the feet.her past medical history significant for diabetes mellitus, hypertension, status post left foot surgery, bilateral knee surgery and right shoulder surgery. She has never been a smoker. during her a ER visit there was no evidence of DVT both lower extremities and her right foot x-ray showed no acute osseous abnormality. fluoroscopy edema she was prescribed Lasix and because of some erythema on the right foot she is prescribed Keflex and was given a dose of vancomycin. She was offered an appointment at the wound clinic for outpatient follow-up. the patient has a past medical history of anemia, atherosclerotic heart disease, diabetes mellitus type 2 uncomplicated, hyperlipidemia, obesity, osteoarthritis, sleep apnea, status post hysterectomy, status post bilateral knee replacements, cardiac catheterization,tendon repair in the left foot. 12/13/2016 -- she and her daughter have not purchased her compression stockings yet and other than that she has been doing very well. Electronic Signature(s) Signed: 12/13/2016 4:12:07 PM By: Evlyn Kanner MD, FACS Entered By: Evlyn Kanner on 12/13/2016 16:12:07 Sydney Schultz ([id number]) -------------------------------------------------------------------------------- Physical Exam Details Patient Name: Sydney Schultz. Date of Service: 12/13/2016 3:30 PM Medical Record Number: [id number] Patient Account Number: [id number] Date of Birth/Sex: 1931-07-31 (83 y.o. Female) Treating RN: Phillis Haggis Primary Care Provider: Dewaine Oats Other Clinician: Referring Provider: Dewaine Oats Treating Provider/Extender: [name]: 2 Constitutional . Pulse regular. Respirations normal and unlabored. Afebrile. . Eyes Nonicteric. Reactive to light. Ears, Nose, Mouth, and Throat Lips, teeth, and gums WNL.Marland Kitchen Moist mucosa without lesions. Neck supple and nontender. No palpable supraclavicular or cervical adenopathy. Normal  sized without goiter. Respiratory WNL. No retractions.. Cardiovascular Pedal Pulses WNL. No clubbing, cyanosis or edema. Lymphatic No adneopathy. No adenopathy. No adenopathy. Musculoskeletal Adexa without tenderness or enlargement.. Digits and nails w/o clubbing, cyanosis, infection, petechiae, ischemia, or inflammatory conditions.. Integumentary (Hair, Skin) No suspicious lesions. No crepitus or fluctuance. No peri-wound warmth or erythema. No masses.Marland Kitchen Psychiatric Judgement and insight Intact.. No evidence of  depression, anxiety, or agitation.. Notes the lymphedema has gone down significantly and the blisters have completely healed Electronic Signature(s) Signed: 12/13/2016 4:12:33 PM By: Evlyn KannerBritto, Mata Rowen MD, FACS Entered By: Evlyn KannerBritto, Izaha Shughart on 12/13/2016 16:12:32 Sydney FolksBIGELOW, Ceri M. ([id number]) -------------------------------------------------------------------------------- Physician Orders Details Patient Name: Sydney FolksBIGELOW, Jermiyah M. Date of Service: 12/13/2016 3:30 PM Medical Record Number: [id number] Patient Account Number: [id number] Date of Birth/Sex: [birth year](84 y.o. Female) Treating RN: Ashok CordiaPinkerton, Debi Primary Care Provider: Dewaine OatsATE, DENNY Other Clinician: Referring Provider: Dewaine OatsATE, DENNY Treating Provider/Extender: Rudene ReBritto, Carlis Burnsworth Weeks in Treatment: 2 Verbal / Phone Orders: Yes Clinician: Pinkerton, Debi Read Back and Verified: Yes Diagnosis Coding Wound Cleansing Wound #1 Left,Medial,Anterior Lower Leg o Clean wound with Normal Saline. o Cleanse wound with mild soap and water Dressing Change Frequency Wound #1 Left,Medial,Anterior Lower Leg o Change dressing every week Follow-up Appointments Wound #1 Left,Medial,Anterior Lower Leg o Return Appointment in 1 week. Edema Control Wound #1 Left,Medial,Anterior Lower Leg o 3 Layer Compression System - Left Lower Extremity - unna to anchor o Elevate legs to the level of the heart and pump ankles as often as  possible Additional Orders / Instructions Wound #1 Left,Medial,Anterior Lower Leg o Increase protein intake. Electronic Signature(s) Signed: 12/13/2016 4:17:59 PM By: Evlyn KannerBritto, Jonavin Seder MD, FACS Signed: 12/13/2016 4:34:22 PM By: Alejandro MullingPinkerton, Debra Entered By: Alejandro MullingPinkerton, Debra on 12/13/2016 16:10:20 Sydney FolksBIGELOW, Saryn M. ([id number]) -------------------------------------------------------------------------------- Problem List Details Patient Name: Sydney FolksBIGELOW, Henley M. Date of Service: 12/13/2016 3:30 PM Medical Record Number: [id number] Patient Account Number: [id number] Date of Birth/Sex: [birth year](84 y.o. Female) Treating RN: Phillis HaggisPinkerton, Debi Primary Care Provider: Dewaine OatsATE, DENNY Other Clinician: Referring Provider: Dewaine OatsATE, DENNY Treating Provider/Extender: Rudene ReBritto, Daelen Belvedere Weeks in Treatment: 2 Active Problems ICD-10 Encounter Code Description Active Date Diagnosis E11.622 Type 2 diabetes mellitus with other skin ulcer 11/28/2016 Yes I89.0 Lymphedema, not elsewhere classified 11/28/2016 Yes M14.671 Charcot's joint, right ankle and foot 11/28/2016 Yes M14.672 Charcot's joint, left ankle and foot 11/28/2016 Yes Inactive Problems Resolved Problems Electronic Signature(s) Signed: 12/13/2016 4:11:31 PM By: Evlyn KannerBritto, Marylon Verno MD, FACS Entered By: Evlyn KannerBritto, Tattianna Schnarr on 12/13/2016 16:11:31 Sydney FolksBIGELOW, Praise M. ([id number]) -------------------------------------------------------------------------------- Progress Note Details Patient Name: Sydney FolksBIGELOW, Merna M. Date of Service: 12/13/2016 3:30 PM Medical Record Number: [id number] Patient Account Number: [id number] Date of Birth/Sex: [birth year](84 y.o. Female) Treating RN: Ashok CordiaPinkerton, Debi Primary Care [facility] Other Clinician: Referring Provider: Dewaine OatsATE, DENNY Treating Provider/Extender: Rudene ReBritto, Jemmie Ledgerwood Weeks in Treatment: 2 Subjective Chief Complaint Information obtained from Patient Compression wrap change due to edema and blisters on the left lower extremity for  about 4 weeks History of Present Illness (HPI) The following HPI elements were documented for the patient's wound: Location: swelling with blisters on the left lower extremity Quality: Patient reports experiencing a dull pain to affected area(s). Severity: Patient states wound are getting worse. Duration: Patient has had the wound for < 4 weeks prior to presenting for treatment Timing: Pain in wound is constant (hurts all the time) Context: The wound would happen gradually Modifying Factors: Other treatment(s) tried include:Lasix, Keflex and referred to the wound care center Associated Signs and Symptoms: Patient reports having increase swelling. this 83 year old patient was recently seen in the ER for a blister noted to both lower extremities and also complained of swelling in the feet.her past medical history significant for diabetes mellitus, hypertension, status post left foot surgery, bilateral knee surgery and right shoulder surgery. She has never been a smoker. during her a ER visit there was no evidence of DVT both lower extremities and her right foot x-ray  showed no acute osseous abnormality. fluoroscopy edema she was prescribed Lasix and because of some erythema on the right foot she is prescribed Keflex and was given a dose of vancomycin. She was offered an appointment at the wound clinic for outpatient follow-up. the patient has a past medical history of anemia, atherosclerotic heart disease, diabetes mellitus type 2 uncomplicated, hyperlipidemia, obesity, osteoarthritis, sleep apnea, status post hysterectomy, status post bilateral knee replacements, cardiac catheterization,tendon repair in the left foot. 12/13/2016 -- she and her daughter have not purchased her compression stockings yet and other than that she has been doing very well. Objective Sydney FolksBIGELOW, Ardith M. ([id number]) Constitutional Pulse regular. Respirations normal and unlabored. Afebrile. Vitals Time Taken: 3:59 PM,  Height: 59 in, Weight: 157 lbs, BMI: 31.7, Temperature: 98.0 F, Pulse: 78 bpm, Respiratory Rate: 16 breaths/min, Blood Pressure: 133/69 mmHg. Eyes Nonicteric. Reactive to light. Ears, Nose, Mouth, and Throat Lips, teeth, and gums WNL.Marland Kitchen. Moist mucosa without lesions. Neck supple and nontender. No palpable supraclavicular or cervical adenopathy. Normal sized without goiter. Respiratory WNL. No retractions.. Cardiovascular Pedal Pulses WNL. No clubbing, cyanosis or edema. Lymphatic No adneopathy. No adenopathy. No adenopathy. Musculoskeletal Adexa without tenderness or enlargement.. Digits and nails w/o clubbing, cyanosis, infection, petechiae, ischemia, or inflammatory conditions.Marland Kitchen. Psychiatric Judgement and insight Intact.. No evidence of depression, anxiety, or agitation.. General Notes: the lymphedema has gone down significantly and the blisters have completely healed Integumentary (Hair, Skin) No suspicious lesions. No crepitus or fluctuance. No peri-wound warmth or erythema. No masses.. Wound #1 status is Open. Original cause of wound was Blister. The wound is located on the Left,Medial,Anterior Lower Leg. The wound measures 0.1cm length x 0.1cm width x 0.1cm depth; 0cm^2 area and 0cm^3 volume. There is no tunneling or undermining noted. There is a none present amount of drainage noted. The wound margin is flat and intact. There is no granulation within the wound bed. There is no necrotic tissue within the wound bed. Wound #2 status is Healed - Epithelialized. Original cause of wound was Blister. The wound is located on the Right,Medial Lower Leg. The wound measures 0cm length x 0cm width x 0cm depth; 0cm^2 area and 0cm^3 volume. There is no tunneling or undermining noted. There is a none present amount of drainage noted. The wound margin is flat and intact. There is no granulation within the wound bed. There is no necrotic tissue within the wound bed. Periwound temperature was  noted as No Abnormality. Sydney FolksBIGELOW, Serene M. ([id number]) Assessment Active Problems ICD-10 E11.622 - Type 2 diabetes mellitus with other skin ulcer I89.0 - Lymphedema, not elsewhere classified M14.671 - Charcot's joint, right ankle and foot M14.672 - Charcot's joint, left ankle and foot Plan Wound Cleansing: Wound #1 Left,Medial,Anterior Lower Leg: Clean wound with Normal Saline. Cleanse wound with mild soap and water Dressing Change Frequency: Wound #1 Left,Medial,Anterior Lower Leg: Change dressing every week Follow-up Appointments: Wound #1 Left,Medial,Anterior Lower Leg: Return Appointment in 1 week. Edema Control: Wound #1 Left,Medial,Anterior Lower Leg: 3 Layer Compression System - Left Lower Extremity - unna to anchor Elevate legs to the level of the heart and pump ankles as often as possible Additional Orders / Instructions: Wound #1 Left,Medial,Anterior Lower Leg: Increase protein intake. After review I have recommended: 1. elevation and exercise. Details of this have been discussed with the patient and her daughter was at the bedside 2. foam with a 3 layer Profore wrap to the left lower extremity. 3. I have recommended for her to get 20-30 mm compression stockings,  so as to help control her lymphedema once she is discharged. She will get this the next time she is here. BRAEDYN, RIGGLE. ([id number]) 4. I anticipate discharge next week and we can send her home in her own compression stockings Electronic Signature(s) Signed: 12/13/2016 4:14:34 PM By: Evlyn Kanner MD, FACS Entered By: Evlyn Kanner on 12/13/2016 16:14:34 Sydney Schultz ([id number]) -------------------------------------------------------------------------------- SuperBill Details Patient Name: Sydney Schultz. Date of Service: 12/13/2016 Medical Record Number: [id number] Patient Account Number: [id number] Date of Birth/Sex: 09-19-1931 (83 y.o. Female) Treating RN: Ashok Cordia, Debi Primary Care Provider:  Dewaine Oats Other Clinician: Referring Provider: Dewaine Oats Treating Provider/Extender: [name]: 2 Diagnosis Coding ICD-10 Codes Code Description E11.622 Type 2 diabetes mellitus with other skin ulcer I89.0 Lymphedema, not elsewhere classified M14.671 Charcot's joint, right ankle and foot M14.672 Charcot's joint, left ankle and foot Facility Procedures CPT4: Description Modifier Quantity Code [id number] (Facility Use Only) [phone] - APPLY MULTLAY COMPRS LWR LT 1 LEG Physician Procedures CPT4 Code: [id number] Description: 99213 - WC PHYS LEVEL 3 - EST PT ICD-10 Description Diagnosis E11.622 Type 2 diabetes mellitus with other skin ulcer I89.0 Lymphedema, not elsewhere classified M14.671 Charcot's joint, right ankle and foot M14.672 Charcot's joint, left ankle  and foot Modifier: Quantity: 1 Electronic Signature(s) Signed: 12/13/2016 4:34:22 PM By: Alejandro Mulling Previous Signature: 12/13/2016 4:14:56 PM Version By: Evlyn Kanner MD, FACS Entered By: Alejandro Mulling on 12/13/2016 16:31:26

## 2016-12-23 ENCOUNTER — Encounter: Payer: Medicare Other | Admitting: Nurse Practitioner

## 2016-12-23 DIAGNOSIS — E11622 Type 2 diabetes mellitus with other skin ulcer: Secondary | ICD-10-CM | POA: Diagnosis not present

## 2016-12-24 NOTE — Progress Notes (Signed)
CONSTANZA, MINCY ([id number]) Visit Report for 12/23/2016 Arrival Information Details Patient Name: Sydney Schultz, Sydney Schultz. Date of Service: 12/23/2016 3:30 PM Medical Record Number: [id number] Patient Account Number: [id number] Date of Birth/Sex: 01/25/1932 (82 y.o. Female) Treating RN: Curtis Sites Primary Care Madline Oesterling: Dewaine Oats Other Clinician: Referring Alacia Rehmann: Dewaine Oats Treating Berdene Askari/Extender: Kathreen Cosier in Treatment: 3 Visit Information History Since Last Visit Added or deleted any medications: No Patient Arrived: Wheel Chair Any new allergies or adverse reactions: No Arrival Time: 15:50 Had a fall or experienced change in No Accompanied By: dtr activities of daily living that may affect Transfer Assistance: Manual risk of falls: Patient Identification Verified: Yes Signs or symptoms of abuse/neglect since last No Secondary Verification Process Yes visito Completed: Hospitalized since last visit: No Patient Requires Transmission- No Has Dressing in Place as Prescribed: Yes Based Precautions: Has Compression in Place as Prescribed: Yes Patient Has Alerts: Yes Pain Present Now: No Patient Alerts: Patient on Blood Thinner 81 mg Aspirin; Plavix DM II Electronic Signature(s) Signed: 12/23/2016 5:39:18 PM By: Curtis Sites Entered By: Curtis Sites on 12/23/2016 15:50:44 Sydney Schultz ([id number]) -------------------------------------------------------------------------------- Clinic Level of Care Assessment Details Patient Name: Sydney Schultz. Date of Service: 12/23/2016 3:30 PM Medical Record Number: [id number] Patient Account Number: [id number] Date of Birth/Sex: 1931-12-19 (83 y.o. Female) Treating RN: Curtis Sites Primary Care Nayana Lenig: TATE, Katherina Right Other Clinician: Referring Lucky Alverson: Dewaine Oats Treating Amellia Panik/Extender: Kathreen Cosier in Treatment: 3 Clinic Level of Care Assessment Items TOOL 4 Quantity Score []  - Use when  only an EandM is performed on FOLLOW-UP visit 0 ASSESSMENTS - Nursing Assessment / Reassessment X - Reassessment of Co-morbidities (includes updates in patient status) 1 10 X - Reassessment of Adherence to Treatment Plan 1 5 ASSESSMENTS - Wound and Skin Assessment / Reassessment X - Simple Wound Assessment / Reassessment - one wound 1 5 []  - Complex Wound Assessment / Reassessment - multiple wounds 0 []  - Dermatologic / Skin Assessment (not related to wound area) 0 ASSESSMENTS - Focused Assessment X - Circumferential Edema Measurements - multi extremities 2 5 []  - Nutritional Assessment / Counseling / Intervention 0 X - Lower Extremity Assessment (monofilament, tuning fork, pulses) 1 5 []  - Peripheral Arterial Disease Assessment (using hand held doppler) 0 ASSESSMENTS - Ostomy and/or Continence Assessment and Care []  - Incontinence Assessment and Management 0 []  - Ostomy Care Assessment and Management (repouching, etc.) 0 PROCESS - Coordination of Care X - Simple Patient / Family Education for ongoing care 1 15 []  - Complex (extensive) Patient / Family Education for ongoing care 0 []  - Staff obtains Chiropractor, Records, Test Results / Process Orders 0 []  - Staff telephones HHA, Nursing Homes / Clarify orders / etc 0 []  - Routine Transfer to another Facility (non-emergent condition) 0 Sydney Schultz, Sydney Schultz. ([id number]) []  - Routine Hospital Admission (non-emergent condition) 0 []  - New Admissions / Manufacturing engineer / Ordering NPWT, Apligraf, etc. 0 []  - Emergency Hospital Admission (emergent condition) 0 X - Simple Discharge Coordination 1 10 []  - Complex (extensive) Discharge Coordination 0 PROCESS - Special Needs []  - Pediatric / Minor Patient Management 0 []  - Isolation Patient Management 0 []  - Hearing / Language / Visual special needs 0 []  - Assessment of Community assistance (transportation, D/C planning, etc.) 0 []  - Additional assistance / Altered mentation 0 []  - Support  Surface(s) Assessment (bed, cushion, seat, etc.) 0 INTERVENTIONS - Wound Cleansing / Measurement X - Simple Wound Cleansing - one wound 1 5 []  -  Complex Wound Cleansing - multiple wounds 0 X - Wound Imaging (photographs - any number of wounds) 1 5 []  - Wound Tracing (instead of photographs) 0 X - Simple Wound Measurement - one wound 1 5 []  - Complex Wound Measurement - multiple wounds 0 INTERVENTIONS - Wound Dressings []  - Small Wound Dressing one or multiple wounds 0 []  - Medium Wound Dressing one or multiple wounds 0 []  - Large Wound Dressing one or multiple wounds 0 []  - Application of Medications - topical 0 []  - Application of Medications - injection 0 INTERVENTIONS - Miscellaneous []  - External ear exam 0 Sydney Schultz, Sydney M. ([id number]) []  - Specimen Collection (cultures, biopsies, blood, body fluids, etc.) 0 []  - Specimen(s) / Culture(s) sent or taken to Lab for analysis 0 []  - Patient Transfer (multiple staff / Michiel Sites Lift / Similar devices) 0 []  - Simple Staple / Suture removal (25 or less) 0 []  - Complex Staple / Suture removal (26 or more) 0 []  - Hypo / Hyperglycemic Management (close monitor of Blood Glucose) 0 []  - Ankle / Brachial Index (ABI) - do not check if billed separately 0 X - Vital Signs 1 5 Has the patient been seen at the hospital within the last three years: Yes Total Score: 80 Level Of Care: New/Established - Level 3 Electronic Signature(s) Signed: 12/23/2016 5:39:18 PM By: Curtis Sites Entered By: Curtis Sites on 12/23/2016 17:13:13 Sydney Schultz ([id number]) -------------------------------------------------------------------------------- Encounter Discharge Information Details Patient Name: Sydney Schultz. Date of Service: 12/23/2016 3:30 PM Medical Record Number: [id number] Patient Account Number: [id number] Date of Birth/Sex: Nov 24, 1931 (83 y.o. Female) Treating RN: Curtis Sites Primary Care Zema Lizardo: Dewaine Oats Other Clinician: Referring  Camri Molloy: Dewaine Oats Treating Elena Cothern/Extender: Kathreen Cosier in Treatment: 3 Encounter Discharge Information Items Discharge Pain Level: 0 Discharge Condition: Stable Ambulatory Status: Wheelchair Discharge Destination: Home Private Transportation: Auto Accompanied By: dtr Schedule Follow-up Appointment: No Medication Reconciliation completed and No provided to Patient/Care Laithan Conchas: Clinical Summary of Care: Electronic Signature(s) Signed: 12/23/2016 5:14:16 PM By: Curtis Sites Entered By: Curtis Sites on 12/23/2016 17:14:16 Sydney Schultz ([id number]) -------------------------------------------------------------------------------- Lower Extremity Assessment Details Patient Name: Sydney Schultz. Date of Service: 12/23/2016 3:30 PM Medical Record Number: [id number] Patient Account Number: [id number] Date of Birth/Sex: [birth year] (83 y.o. Female) Treating RN: Curtis Sites Primary Care Jervis Trapani: Dewaine Oats Other Clinician: Referring Yanisa Goodgame: Dewaine Oats Treating Jakyia Gaccione/Extender: Kathreen Cosier in Treatment: 3 Edema Assessment Assessed: [Left: No] [Right: No] E[Left: dema] [Right: :] Calf Left: Right: Point of Measurement: 30 cm From Medial Instep 36.4 cm 34.8 cm Ankle Left: Right: Point of Measurement: 10 cm From Medial Instep 22.3 cm 23.5 cm Vascular Assessment Pulses: Dorsalis Pedis Palpable: [Left:Yes] [Right:Yes] Posterior Tibial Extremity colors, hair growth, and conditions: Extremity Color: [Left:Hyperpigmented] [Right:Hyperpigmented] Hair Growth on Extremity: [Left:No] [Right:No] Temperature of Extremity: [Left:Warm] [Right:Warm] Capillary Refill: [Left:< 3 seconds] [Right:< 3 seconds] Electronic Signature(s) Signed: 12/23/2016 5:13:49 PM By: Curtis Sites Entered By: Curtis Sites on 12/23/2016 17:13:49 Sydney Schultz  ([id number]) -------------------------------------------------------------------------------- Multi-Disciplinary Care Plan Details Patient Name: IISHA, SOYARS. Date of Service: 12/23/2016 3:30 PM Medical Record Number: [id number] Patient Account Number: [id number] Date of Birth/Sex: Nov 05, 1931 (83 y.o. Female) Treating RN: Curtis Sites Primary Care Conley Delisle: Dewaine Oats Other Clinician: Referring Darinda [place]: Dewaine Oats Treating Kayda Allers/Extender: Kathreen Cosier in Treatment: 3 Active Inactive Electronic Signature(s) Signed: 12/23/2016 5:39:18 PM By: Curtis Sites Entered By: Curtis Sites on 12/23/2016 16:07:40 Sydney Schultz ([id number]) -------------------------------------------------------------------------------- Pain Assessment Details Patient Name: Sydney Schultz, Sydney Schultz.  Date of Service: 12/23/2016 3:30 PM Medical Record Number: [id number] Patient Account Number: [id number] Date of Birth/Sex: 05/18/1931 [age] y.o. Female) Treating RN: Curtis Sitesorthy, Joanna Primary Care Linetta Regner: Dewaine OatsATE, DENNY Other Clinician: Referring Genavieve Mangiapane: Dewaine OatsATE, DENNY Treating Kirtis Challis/Extender: Kathreen Cosieroulter, Leah Weeks in Treatment: 3 Active Problems Location of Pain Severity and Description of Pain Patient Has Paino No Site Locations Pain Management and Medication Current Pain Management: Electronic Signature(s) Signed: 12/23/2016 5:39:18 PM By: Curtis Sitesorthy, Joanna Entered By: Curtis Sitesorthy, Joanna on 12/23/2016 15:52:19 Sydney FolksBIGELOW, Sydney M. ([id number]) -------------------------------------------------------------------------------- Patient/Caregiver Education Details Patient Name: Sydney FolksBIGELOW, Sydney M. 12/23/2016 3:30 Date of Service: PM Medical Record [id number] Number: Patient Account Number: [id number] 05/18/1931 (84 y.o. Treating RN: Curtis Sitesorthy, Joanna Date of Birth/Gender: Female) Other Clinician: Primary Care Physician: [facility] Treating Penne Lashoulter, Leah Referring Physician: Dewaine OatsATE, DENNY Physician/Extender: Tania AdeWeeks  in Treatment: 3 Education Assessment Education Provided To: Patient and Caregiver Education Topics Provided Venous: Handouts: Other: wear compression daily Methods: Explain/Verbal Responses: State content correctly Electronic Signature(s) Signed: 12/23/2016 5:39:18 PM By: Curtis Sitesorthy, Joanna Entered By: Curtis Sitesorthy, Joanna on 12/23/2016 17:14:33 Sydney Schultz, Sydney LullRIS M. ([id number]) -------------------------------------------------------------------------------- Wound Assessment Details Patient Name: Sydney FolksBIGELOW, Sydney M. Date of Service: 12/23/2016 3:30 PM Medical Record Number: [id number] Patient Account Number: [id number] Date of Birth/Sex: 05/18/1931 [age] y.o. Female) Treating RN: Curtis Sitesorthy, Joanna Primary Care Yuette Putnam: Dewaine OatsATE, DENNY Other Clinician: Referring Reign Bartnick: Dewaine OatsATE, DENNY Treating Peniel Hass/Extender: Kathreen Cosieroulter, Leah Weeks in Treatment: 3 Wound Status Wound Number: 1 Primary Etiology: Lymphedema Wound Location: Left, Medial, Anterior Lower Wound Status: Open Leg Wounding Event: Blister Date Acquired: 11/22/2016 Weeks Of Treatment: 3 Clustered Wound: No Photos Photo Uploaded By: Curtis Sitesorthy, Joanna on 12/23/2016 16:42:38 Wound Measurements Length: (cm) 0 % Reduction in Width: (cm) 0 % Reduction in Depth: (cm) 0 Area: (cm) 0 Volume: (cm) 0 Area: 100% Volume: 100% Wound Description Classification: Unclassifiable Periwound Skin Texture Texture Color No Abnormalities Noted: No No Abnormalities Noted: No Moisture No Abnormalities Noted: No Electronic Signature(s) Signed: 12/23/2016 5:39:18 PM By: Mikey Bussingorthy, Joanna Needle, Cella M. ([id number]) Entered By: Curtis Sitesorthy, Joanna on 12/23/2016 15:56:23 Sydney FolksBIGELOW, Sydney M. ([id number]) -------------------------------------------------------------------------------- Vitals Details Patient Name: Sydney FolksBIGELOW, Sydney M. Date of Service: 12/23/2016 3:30 PM Medical Record Number: [id number] Patient Account Number: [id number] Date of Birth/Sex: 05/18/1931 [age]  y.o. Female) Treating RN: Curtis Sitesorthy, Joanna Primary Care Zev Blue: TATE, Katherina RightENNY Other Clinician: Referring Demarcus Thielke: Dewaine OatsATE, DENNY Treating Jeffry Vogelsang/Extender: Kathreen Cosieroulter, Leah Weeks in Treatment: 3 Vital Signs Time Taken: 15:52 Temperature (F): 98.3 Height (in): 59 Pulse (bpm): 71 Weight (lbs): 157 Respiratory Rate (breaths/min): 18 Body Mass Index (BMI): 31.7 Blood Pressure (mmHg): 148/64 Reference Range: 80 - 120 mg / dl Electronic Signature(s) Signed: 12/23/2016 5:39:18 PM By: Curtis Sitesorthy, Joanna Entered By: Curtis Sitesorthy, Joanna on 12/23/2016 15:54:40

## 2016-12-24 NOTE — Progress Notes (Signed)
Sydney Schultz, Sydney M. ([id number]) Visit Report for 12/23/2016 Chief Complaint Document Details Patient Name: Sydney Schultz, Sydney M. Date of Service: 12/23/2016 Schultz:30 PM Medical Record Number: [id number] Patient Account Number: [id number] Date of Birth/Sex: 12-Jan-1932 [age] y.o. Female) Treating RN: Sydney Schultz Primary Care Provider: Dewaine OatsATE, Schultz Other Clinician: Referring Provider: Dewaine OatsATE, Schultz Treating Provider/Extender: Sydney Schultz Information Obtained from: Patient Chief Complaint was healed last week; presents for education and application of compression hose Electronic Signature(s) Signed: 12/23/2016 5:23:40 PM By: Sydney Schultz Entered By: Sydney Schultz on 12/23/2016 16:30:40 Sydney Schultz, Sydney M. ([id number]) -------------------------------------------------------------------------------- HPI Details Patient Name: Sydney Schultz, Sydney M. Date of Service: 12/23/2016 Schultz:30 PM Medical Record Number: [id number] Patient Account Number: [id number] Date of Birth/Sex: 12-Jan-1932 [age] y.o. Female) Treating RN: Sydney Schultz Primary Care Provider: Dewaine OatsATE, Schultz Other Clinician: Referring Provider: Dewaine OatsATE, Schultz Treating Provider/Extender: Sydney Schultz History of Present Illness Location: swelling with blisters on the left lower extremity Quality: Patient reports experiencing a dull pain to affected area(s). Severity: Patient states wound are getting worse. Duration: Patient has had the wound for < 4 weeks prior to presenting for treatment Timing: Pain in wound is constant (hurts all the time) Context: The wound would happen gradually Modifying Factors: Other treatment(s) tried include:Lasix, Keflex and referred to the wound care center Associated Signs and Symptoms: Patient reports having increase swelling. HPI Description: this 83 year old patient was recently seen in the ER for a blister noted to both lower extremities and also complained of swelling in the  feet.her past medical history significant for diabetes mellitus, hypertension, status post left foot surgery, bilateral knee surgery and right shoulder surgery. She has never been a smoker. during her a ER visit there was no evidence of DVT both lower extremities and her right foot x-ray showed no acute osseous abnormality. fluoroscopy edema she was prescribed Lasix and because of some erythema on the right foot she is prescribed Keflex and was given a dose of vancomycin. She was offered an appointment at the wound clinic for outpatient follow-up. the patient has a past medical history of anemia, atherosclerotic heart disease, diabetes mellitus type 2 uncomplicated, hyperlipidemia, obesity, osteoarthritis, sleep apnea, status post hysterectomy, status post bilateral knee replacements, cardiac catheterization,tendon repair in the left foot. 12/13/2016 -- she and her daughter have not purchased her compression stockings yet and other than that she has been doing very well. 12/23/16-she remains healed. She presents with compression stockings, 20-30 mmHg Electronic Signature(s) Signed: 12/23/2016 5:23:40 PM By: Sydney Schultz Entered By: Sydney Schultz on 12/23/2016 16:31:06 Sydney Schultz, Sydney M. ([id number]) -------------------------------------------------------------------------------- Physical Exam Details Patient Name: Sydney Schultz, Sydney M. Date of Service: 12/23/2016 Schultz:30 PM Medical Record Number: [id number] Patient Account Number: [id number] Date of Birth/Sex: 12-Jan-1932 [age] y.o. Female) Treating RN: Sydney Schultz Primary Care Provider: Dewaine OatsATE, Schultz Other Clinician: Referring Provider: Dewaine OatsATE, Schultz Treating Provider/Extender: Sydney Schultz Weeks in Treatment: Schultz Constitutional . Electronic Signature(s) Signed: 12/23/2016 5:23:40 PM By: Sydney Schultz Entered By: Sydney Publicoulter, Chantell Kunkler on 12/23/2016 16:31:27 Sydney Schultz, Sydney M.  ([id number]) -------------------------------------------------------------------------------- Physician Orders Details Patient Name: Sydney Schultz, Sydney M. Date of Service: 12/23/2016 Schultz:30 PM Medical Record Number: [id number] Patient Account Number: [id number] Date of Birth/Sex: 12-Jan-1932 [age] y.o. Female) Treating RN: Sydney Schultz Primary Care Provider: Dewaine OatsATE, Schultz Other Clinician: Referring Provider: Dewaine OatsATE, Schultz Treating Provider/Extender: Sydney Cosieroulter, Dick Hark Weeks in Treatment: Schultz Verbal / Phone Orders: No Diagnosis [facility]oding [facility] Discharge from Wound Care Center - wear compression hose daily Electronic Signature(s) Signed: 12/23/2016 5:23:40 PM By: Penne Lashoulter,  Wilferd Ritson Signed: 12/23/2016 5:39:18 PM By: Sydney Schultz Entered By: Sydney Schultz on 12/23/2016 16:08:07 Sydney Schultz ([id number]) -------------------------------------------------------------------------------- Problem List Details Patient Name: Sydney Schultz, CAMERON. Date of Service: 12/23/2016 Schultz:30 PM Medical Record Number: [id number] Patient Account Number: [id number] Date of Birth/Sex: [birth year] (83 y.o. Female) Treating RN: Sydney Haggis Primary Care Provider: Dewaine Oats Other Clinician: Referring Provider: Dewaine Oats Treating Provider/Extender: Sydney Cosier in Treatment: Schultz Active Problems ICD-10 Encounter Code Description Active Date Diagnosis E11.622 Type 2 diabetes mellitus with other skin ulcer 11/28/2016 Yes I89.0 Lymphedema, not elsewhere classified 11/28/2016 Yes M14.671 Charcot's joint, right ankle and foot 11/28/2016 Yes M14.672 Charcot's joint, left ankle and foot 11/28/2016 Yes Inactive Problems Resolved Problems Electronic Signature(s) Signed: 12/23/2016 5:23:40 PM By: Sydney Public Entered By: Sydney Public on 12/23/2016 16:30:03 Sydney Schultz ([id number]) -------------------------------------------------------------------------------- Progress Note Details Patient Name:  Sydney Schultz. Date of Service: 12/23/2016 Schultz:30 PM Medical Record Number: [id number] Patient Account Number: [id number] Date of Birth/Sex: 1932-05-11 (83 y.o. Female) Treating RN: Sydney Cordia, Schultz Primary Care Provider: Dewaine Oats Other Clinician: Referring Provider: Dewaine Oats Treating Provider/Extender: Sydney Cosier in Treatment: Schultz Subjective Chief Complaint Information obtained from Patient was healed last week; presents for education and application of compression hose History of Present Illness (HPI) The following HPI elements were documented for the patient's wound: Location: swelling with blisters on the left lower extremity Quality: Patient reports experiencing a dull pain to affected area(s). Severity: Patient states wound are getting worse. Duration: Patient has had the wound for < 4 weeks prior to presenting for treatment Timing: Pain in wound is constant (hurts all the time) Context: The wound would happen gradually Modifying Factors: Other treatment(s) tried include:Lasix, Keflex and referred to the wound care center Associated Signs and Symptoms: Patient reports having increase swelling. this 83 year old patient was recently seen in the ER for a blister noted to both lower extremities and also complained of swelling in the feet.her past medical history significant for diabetes mellitus, hypertension, status post left foot surgery, bilateral knee surgery and right shoulder surgery. She has never been a smoker. during her a ER visit there was no evidence of DVT both lower extremities and her right foot x-ray showed no acute osseous abnormality. fluoroscopy edema she was prescribed Lasix and because of some erythema on the right foot she is prescribed Keflex and was given a dose of vancomycin. She was offered an appointment at the wound clinic for outpatient follow-up. the patient has a past medical history of anemia, atherosclerotic heart disease, diabetes mellitus  type 2 uncomplicated, hyperlipidemia, obesity, osteoarthritis, sleep apnea, status post hysterectomy, status post bilateral knee replacements, cardiac catheterization,tendon repair in the left foot. 12/13/2016 -- she and her daughter have not purchased her compression stockings yet and other than that she has been doing very well. 12/23/16-she remains healed. She presents with compression stockings, 20-30 mmHg Sydney Schultz, MOHON. ([id number]) Objective Constitutional Vitals Time Taken: Schultz:52 PM, Height: 59 in, Weight: 157 lbs, BMI: 31.7, Temperature: 98.Schultz F, Pulse: 71 bpm, Respiratory Rate: 18 breaths/min, Blood Pressure: 148/64 mmHg. Integumentary (Hair, Skin) Wound #1 status is Open. Original cause of wound was Blister. The wound is located on the Left,Medial,Anterior Lower Leg. The wound measures 0cm length x 0cm width x 0cm depth; 0cm^2 area and 0cm^Schultz volume. Assessment Active Problems ICD-10 E11.622 - Type 2 diabetes mellitus with other skin ulcer I89.0 - Lymphedema, not elsewhere classified M14.671 - Charcot's joint, right ankle and foot M14.672 - Charcot's joint, left ankle and  foot Plan Discharge From [facility] Services: Discharge from Wound Care Center - wear compression hose daily Electronic Signature(s) Signed: 12/23/2016 5:23:40 PM By: Sydney Public Entered By: Sydney Public on 12/23/2016 16:31:39 Sydney Schultz, Sydney Schultz ([id number]) Sydney Schultz, Sydney Schultz ([id number]) -------------------------------------------------------------------------------- SuperBill Details Patient Name: COURTNY, BENNISON. Date of Service: 12/23/2016 Medical Record Number: [id number] Patient Account Number: [id number] Date of Birth/Sex: May 03, 1932 (83 y.o. Female) Treating RN: Sydney Haggis Primary Care Provider: Dewaine Oats Other Clinician: Referring Provider: Dewaine Oats Treating Provider/Extender: Sydney Cosier in Treatment: Schultz Diagnosis Coding ICD-10 Codes Code Description E11.622 Type 2 diabetes  mellitus with other skin ulcer I89.0 Lymphedema, not elsewhere classified M14.671 Charcot's joint, right ankle and foot M14.672 Charcot's joint, left ankle and foot Physician Procedures CPT4 Code: [id number] Description: 69629 - WC PHYS LEVEL 2 - EST PT ICD-10 Description Diagnosis I89.0 Lymphedema, not elsewhere classified Modifier: Quantity: 1 Electronic Signature(s) Signed: 12/23/2016 5:23:40 PM By: Sydney Public Entered By: Sydney Public on 12/23/2016 16:31:57

## 2017-04-28 ENCOUNTER — Other Ambulatory Visit: Payer: Self-pay | Admitting: Internal Medicine

## 2017-04-28 DIAGNOSIS — G3184 Mild cognitive impairment, so stated: Secondary | ICD-10-CM

## 2017-05-05 ENCOUNTER — Ambulatory Visit
Admission: RE | Admit: 2017-05-05 | Discharge: 2017-05-05 | Disposition: A | Discharge status: Home / Self Care | Payer: Medicare Other | Source: Ambulatory Visit | Attending: Internal Medicine | Admitting: Internal Medicine

## 2017-05-05 DIAGNOSIS — G3184 Mild cognitive impairment, so stated: Secondary | ICD-10-CM | POA: Diagnosis not present

## 2017-05-05 DIAGNOSIS — I6782 Cerebral ischemia: Secondary | ICD-10-CM | POA: Insufficient documentation

## 2017-05-05 DIAGNOSIS — G319 Degenerative disease of nervous system, unspecified: Secondary | ICD-10-CM | POA: Diagnosis not present

## 2017-05-05 LAB — POCT I-STAT CREATININE: CREATININE: 1.3 mg/dL — AB (ref 0.44–1.00)

## 2017-05-05 MED ORDER — IOPAMIDOL (ISOVUE-300) INJECTION 61%
75.0000 mL | Freq: Once | INTRAVENOUS | Status: AC | PRN
  Administered 2017-05-05: 60 mL via INTRAVENOUS

## 2017-06-11 ENCOUNTER — Other Ambulatory Visit
Admission: RE | Admit: 2017-06-11 | Discharge: 2017-06-11 | Disposition: A | Discharge status: Home / Self Care | Payer: Medicare Other | Source: Ambulatory Visit | Attending: Ophthalmology | Admitting: Ophthalmology

## 2017-06-11 DIAGNOSIS — M316 Other giant cell arteritis: Secondary | ICD-10-CM | POA: Insufficient documentation

## 2017-06-11 LAB — SEDIMENTATION RATE: Sed Rate: 34 mm/hr — ABNORMAL HIGH (ref 0–30)

## 2017-06-11 LAB — CBC WITH DIFFERENTIAL/PLATELET
BASOS ABS: 0 10*3/uL (ref 0–0.1)
BASOS PCT: 1 %
EOS PCT: 6 %
Eosinophils Absolute: 0.2 10*3/uL (ref 0–0.7)
HCT: 35.2 % (ref 35.0–47.0)
Hemoglobin: 11.7 g/dL — ABNORMAL LOW (ref 12.0–16.0)
Lymphocytes Relative: 25 %
Lymphs Abs: 0.7 10*3/uL — ABNORMAL LOW (ref 1.0–3.6)
MCH: 29.8 pg (ref 26.0–34.0)
MCHC: 33.3 g/dL (ref 32.0–36.0)
MCV: 89.5 fL (ref 80.0–100.0)
MONO ABS: 0.4 10*3/uL (ref 0.2–0.9)
Monocytes Relative: 14 %
NEUTROS ABS: 1.6 10*3/uL (ref 1.4–6.5)
Neutrophils Relative %: 54 %
PLATELETS: 219 10*3/uL (ref 150–440)
RBC: 3.93 MIL/uL (ref 3.80–5.20)
RDW: 13.8 % (ref 11.5–14.5)
WBC: 2.9 10*3/uL — ABNORMAL LOW (ref 3.6–11.0)

## 2017-06-11 LAB — C-REACTIVE PROTEIN

## 2017-07-07 ENCOUNTER — Encounter: Payer: Self-pay | Admitting: General Surgery

## 2017-07-15 ENCOUNTER — Encounter: Payer: Self-pay | Admitting: *Deleted

## 2017-07-29 ENCOUNTER — Encounter
Admission: RE | Disposition: A | Discharge status: Home / Self Care | Payer: Self-pay | Source: Ambulatory Visit | Attending: Ophthalmology

## 2017-07-29 ENCOUNTER — Ambulatory Visit: Payer: Medicare Other | Admitting: Certified Registered Nurse Anesthetist

## 2017-07-29 ENCOUNTER — Ambulatory Visit
Admission: RE | Admit: 2017-07-29 | Discharge: 2017-07-29 | Disposition: A | Discharge status: Home / Self Care | Payer: Medicare Other | Source: Ambulatory Visit | Attending: Ophthalmology | Admitting: Ophthalmology

## 2017-07-29 ENCOUNTER — Encounter: Payer: Self-pay | Admitting: *Deleted

## 2017-07-29 ENCOUNTER — Other Ambulatory Visit: Payer: Self-pay

## 2017-07-29 DIAGNOSIS — G4730 Sleep apnea, unspecified: Secondary | ICD-10-CM | POA: Diagnosis not present

## 2017-07-29 DIAGNOSIS — Z96653 Presence of artificial knee joint, bilateral: Secondary | ICD-10-CM | POA: Insufficient documentation

## 2017-07-29 DIAGNOSIS — K219 Gastro-esophageal reflux disease without esophagitis: Secondary | ICD-10-CM | POA: Diagnosis not present

## 2017-07-29 DIAGNOSIS — Z955 Presence of coronary angioplasty implant and graft: Secondary | ICD-10-CM | POA: Diagnosis not present

## 2017-07-29 DIAGNOSIS — E119 Type 2 diabetes mellitus without complications: Secondary | ICD-10-CM | POA: Insufficient documentation

## 2017-07-29 DIAGNOSIS — Z7982 Long term (current) use of aspirin: Secondary | ICD-10-CM | POA: Insufficient documentation

## 2017-07-29 DIAGNOSIS — Z79899 Other long term (current) drug therapy: Secondary | ICD-10-CM | POA: Insufficient documentation

## 2017-07-29 DIAGNOSIS — Z6834 Body mass index (BMI) 34.0-34.9, adult: Secondary | ICD-10-CM | POA: Diagnosis not present

## 2017-07-29 DIAGNOSIS — Z7984 Long term (current) use of oral hypoglycemic drugs: Secondary | ICD-10-CM | POA: Diagnosis not present

## 2017-07-29 DIAGNOSIS — M1990 Unspecified osteoarthritis, unspecified site: Secondary | ICD-10-CM | POA: Diagnosis not present

## 2017-07-29 DIAGNOSIS — Z7902 Long term (current) use of antithrombotics/antiplatelets: Secondary | ICD-10-CM | POA: Diagnosis not present

## 2017-07-29 DIAGNOSIS — Z9989 Dependence on other enabling machines and devices: Secondary | ICD-10-CM | POA: Diagnosis not present

## 2017-07-29 DIAGNOSIS — I10 Essential (primary) hypertension: Secondary | ICD-10-CM | POA: Insufficient documentation

## 2017-07-29 DIAGNOSIS — H2512 Age-related nuclear cataract, left eye: Secondary | ICD-10-CM | POA: Diagnosis present

## 2017-07-29 DIAGNOSIS — E669 Obesity, unspecified: Secondary | ICD-10-CM | POA: Insufficient documentation

## 2017-07-29 HISTORY — PX: CATARACT EXTRACTION W/PHACO: SHX586

## 2017-07-29 HISTORY — DX: Dyspnea, unspecified: R06.00

## 2017-07-29 HISTORY — DX: Unspecified urinary incontinence: R32

## 2017-07-29 HISTORY — DX: Sleep apnea, unspecified: G47.30

## 2017-07-29 HISTORY — DX: Unspecified osteoarthritis, unspecified site: M19.90

## 2017-07-29 HISTORY — DX: Other specified soft tissue disorders: M79.89

## 2017-07-29 HISTORY — DX: Gastro-esophageal reflux disease without esophagitis: K21.9

## 2017-07-29 LAB — GLUCOSE, CAPILLARY: Glucose-Capillary: 147 mg/dL — ABNORMAL HIGH (ref 65–99)

## 2017-07-29 SURGERY — PHACOEMULSIFICATION, CATARACT, WITH IOL INSERTION
Anesthesia: Monitor Anesthesia Care | Site: Eye | Laterality: Left | Wound class: Clean

## 2017-07-29 MED ORDER — FENTANYL CITRATE (PF) 100 MCG/2ML IJ SOLN
INTRAMUSCULAR | Status: DC | PRN
  Administered 2017-07-29 (×2): 25 ug via INTRAVENOUS

## 2017-07-29 MED ORDER — NA CHONDROIT SULF-NA HYALURON 40-17 MG/ML IO SOLN
INTRAOCULAR | Status: AC
  Filled 2017-07-29: qty 1

## 2017-07-29 MED ORDER — LIDOCAINE HCL (PF) 4 % IJ SOLN
INTRAMUSCULAR | Status: AC
  Filled 2017-07-29: qty 5

## 2017-07-29 MED ORDER — MOXIFLOXACIN HCL 0.5 % OP SOLN
OPHTHALMIC | Status: DC | PRN
  Administered 2017-07-29: 0.2 mL via OPHTHALMIC

## 2017-07-29 MED ORDER — CARBACHOL 0.01 % IO SOLN
INTRAOCULAR | Status: DC | PRN
  Administered 2017-07-29: 0.5 mL via INTRAOCULAR

## 2017-07-29 MED ORDER — ARMC OPHTHALMIC DILATING DROPS
1.0000 "application " | OPHTHALMIC | Status: AC
  Administered 2017-07-29 (×3): 1 via OPHTHALMIC

## 2017-07-29 MED ORDER — FENTANYL CITRATE (PF) 100 MCG/2ML IJ SOLN
INTRAMUSCULAR | Status: AC
  Filled 2017-07-29: qty 2

## 2017-07-29 MED ORDER — EPINEPHRINE PF 1 MG/ML IJ SOLN
INTRAMUSCULAR | Status: AC
  Filled 2017-07-29: qty 2

## 2017-07-29 MED ORDER — NA CHONDROIT SULF-NA HYALURON 40-17 MG/ML IO SOLN
INTRAOCULAR | Status: DC | PRN
  Administered 2017-07-29: 1 mL via INTRAOCULAR

## 2017-07-29 MED ORDER — EPINEPHRINE PF 1 MG/ML IJ SOLN
INTRAMUSCULAR | Status: DC | PRN
  Administered 2017-07-29: 10:00:00 via OPHTHALMIC

## 2017-07-29 MED ORDER — MOXIFLOXACIN HCL 0.5 % OP SOLN
OPHTHALMIC | Status: AC
  Filled 2017-07-29: qty 3

## 2017-07-29 MED ORDER — POVIDONE-IODINE 5 % OP SOLN
OPHTHALMIC | Status: DC | PRN
  Administered 2017-07-29: 1 via OPHTHALMIC

## 2017-07-29 MED ORDER — SODIUM CHLORIDE 0.9 % IV SOLN
INTRAVENOUS | Status: DC
  Administered 2017-07-29: 09:00:00 via INTRAVENOUS

## 2017-07-29 MED ORDER — POVIDONE-IODINE 5 % OP SOLN
OPHTHALMIC | Status: AC
  Filled 2017-07-29: qty 30

## 2017-07-29 MED ORDER — LIDOCAINE HCL (PF) 4 % IJ SOLN
INTRAOCULAR | Status: DC | PRN
  Administered 2017-07-29: 4 mL via OPHTHALMIC

## 2017-07-29 MED ORDER — MOXIFLOXACIN HCL 0.5 % OP SOLN: 1.0000 [drp] | Freq: Once | OPHTHALMIC | Status: DC

## 2017-07-29 MED ORDER — ARMC OPHTHALMIC DILATING DROPS
OPHTHALMIC | Status: AC
  Administered 2017-07-29: 1 via OPHTHALMIC
  Filled 2017-07-29: qty 0.4

## 2017-07-29 SURGICAL SUPPLY — 16 items
GLOVE BIO SURGEON STRL SZ8 (GLOVE) ×3 IMPLANT
GLOVE BIOGEL M 6.5 STRL (GLOVE) ×3 IMPLANT
GLOVE SURG LX 8.0 MICRO (GLOVE) ×2
GLOVE SURG LX STRL 8.0 MICRO (GLOVE) ×1 IMPLANT
GOWN STRL REUS W/ TWL LRG LVL3 (GOWN DISPOSABLE) ×2 IMPLANT
GOWN STRL REUS W/TWL LRG LVL3 (GOWN DISPOSABLE) ×4
LABEL CATARACT MEDS ST (LABEL) ×3 IMPLANT
LENS IOL TECNIS ITEC 20.5 (Intraocular Lens) ×3 IMPLANT
PACK CATARACT (MISCELLANEOUS) ×3 IMPLANT
PACK CATARACT BRASINGTON LX (MISCELLANEOUS) ×3 IMPLANT
PACK EYE AFTER SURG (MISCELLANEOUS) ×3 IMPLANT
SOL BSS BAG (MISCELLANEOUS) ×3
SOLUTION BSS BAG (MISCELLANEOUS) ×1 IMPLANT
SYR 5ML LL (SYRINGE) ×3 IMPLANT
WATER STERILE IRR 250ML POUR (IV SOLUTION) ×3 IMPLANT
WIPE NON LINTING 3.25X3.25 (MISCELLANEOUS) ×3 IMPLANT

## 2017-07-29 NOTE — Discharge Instructions (Signed)
FOLLOW DR. PORFILIO'S POSTOP EYE DROP INSTRUCTION SHEET AS REVIEWED.  Eye Surgery Discharge Instructions  Expect mild scratchy sensation or mild soreness. DO NOT RUB YOUR EYE!  The day of surgery:  Minimal physical activity, but bed rest is not required  No reading, computer work, or close hand work  No bending, lifting, or straining.  May watch TV  For 24 hours:  No driving, legal decisions, or alcoholic beverages  Safety precautions  Eat anything you prefer: It is better to start with liquids, then soup then solid foods.  _____ Eye patch should be worn until postoperative exam tomorrow.  ____ Solar shield eyeglasses should be worn for comfort in the sunlight/patch while sleeping  Resume all regular medications including aspirin or Coumadin if these were discontinued prior to surgery. You may shower, bathe, shave, or wash your hair. Tylenol may be taken for mild discomfort.  Call your doctor if you experience significant pain, nausea, or vomiting, fever > 101 or other signs of infection. [phone] or [phone] Specific instructions:  Follow-up Information    Galen ManilaPorfilio, William, MD Follow up.   Specialty:  Ophthalmology Why:  Wednesday 07/30/17 @ 10:40 am Contact information: [address] [place] [address] [phone]

## 2017-07-29 NOTE — Op Note (Signed)
PREOPERATIVE DIAGNOSIS:  Nuclear sclerotic cataract of the left eye.   POSTOPERATIVE DIAGNOSIS:  Nuclear sclerotic cataract of the left eye.   OPERATIVE PROCEDURE: Procedure(s): CATARACT EXTRACTION PHACO AND INTRAOCULAR LENS PLACEMENT ([facility])   SURGEON:  Galen ManilaWilliam Milisa Kimbell, MD.   ANESTHESIA:  Anesthesiologist: Alver FisherPenwarden, Amy, MD CRNA: Dava NajjarFrazier, Susan, CRNA  1.      Managed anesthesia care. 2.     0.451ml of Shugarcaine was instilled following the paracentesis   COMPLICATIONS:  None.   TECHNIQUE:   Stop and chop   DESCRIPTION OF PROCEDURE:  The patient was examined and consented in the preoperative holding area where the aforementioned topical anesthesia was applied to the left eye and then brought back to the Operating Room where the left eye was prepped and draped in the usual sterile ophthalmic fashion and a lid speculum was placed. A paracentesis was created with the side port blade and the anterior chamber was filled with viscoelastic. A near clear corneal incision was performed with the steel keratome. A continuous curvilinear capsulorrhexis was performed with a cystotome followed by the capsulorrhexis forceps. Hydrodissection and hydrodelineation were carried out with BSS on a blunt cannula. The lens was removed in a stop and chop  technique and the remaining cortical material was removed with the irrigation-aspiration handpiece. The capsular bag was inflated with viscoelastic and the Technis ZCB00 lens was placed in the capsular bag without complication. The remaining viscoelastic was removed from the eye with the irrigation-aspiration handpiece. The wounds were hydrated. The anterior chamber was flushed with Miostat and the eye was inflated to physiologic pressure. 0.151ml Vigamox was placed in the anterior chamber. The wounds were found to be water tight. The eye was dressed with Vigamox. The patient was given protective glasses to wear throughout the day and a shield with which to sleep  tonight. The patient was also given drops with which to begin a drop regimen today and will follow-up with me in one day. Implant Name Type Inv. Item Serial No. Manufacturer Lot No. LRB No. Used  LENS IOL DIOP 20.5 - [id number] Intraocular Lens LENS IOL DIOP 20.5 [id number] AMO  Left 1    Procedure(s) with comments: CATARACT EXTRACTION PHACO AND INTRAOCULAR LENS PLACEMENT ([facility]) (Left) - US 00:38.9 AP% 12.0 CDE 4.68 Fluid Pack Lot # [id number]  Electronically signed: Galen ManilaWilliam Rhiana Morash 07/29/2017 10:29 AM

## 2017-07-29 NOTE — H&P (Signed)
All labs reviewed. Abnormal studies sent to patients PCP when indicated.  Previous H&P reviewed, patient examined, there are NO CHANGES.  Sydney Axe Porfilio3/19/201910:02 AM

## 2017-07-29 NOTE — Anesthesia Preprocedure Evaluation (Signed)
Anesthesia Evaluation  Patient identified by MRN, date of birth, ID band Patient awake    Reviewed: Allergy & Precautions, NPO status , Patient's Chart, lab work & pertinent test results  History of Anesthesia Complications Negative for: history of anesthetic complications  Airway Mallampati: III  TM Distance: >3 FB Neck ROM: Full    Dental no notable dental hx.    Pulmonary sleep apnea and Continuous Positive Airway Pressure Ventilation , neg COPD,    breath sounds clear to auscultation- rhonchi (-) wheezing      Cardiovascular hypertension, Pt. on medications (-) CAD, (-) Past MI, (-) Cardiac Stents and (-) CABG  Rhythm:Regular Rate:Normal - Systolic murmurs and - Diastolic murmurs    Neuro/Psych negative neurological ROS  negative psych ROS   GI/Hepatic Neg liver ROS, GERD  ,  Endo/Other  diabetes, Oral Hypoglycemic Agents  Renal/GU negative Renal ROS     Musculoskeletal  (+) Arthritis ,   Abdominal (+) + obese,   Peds  Hematology negative hematology ROS (+)   Anesthesia Other Findings Past Medical History: No date: Arthritis No date: Diabetes (HCC) No date: Dyspnea     Comment:  with exertion No date: GERD (gastroesophageal reflux disease) No date: Hypertension No date: Incontinence No date: Sleep apnea     Comment:  uses cpap No date: Swelling of both lower extremities   Reproductive/Obstetrics                             Anesthesia Physical Anesthesia Plan  ASA: III  Anesthesia Plan: MAC   Post-op Pain Management:    Induction: Intravenous  PONV Risk Score and Plan: 2 and Midazolam  Airway Management Planned: Natural Airway  Additional Equipment:   Intra-op Plan:   Post-operative Plan:   Informed Consent: I have reviewed the patients History and Physical, chart, labs and discussed the procedure including the risks, benefits and alternatives for the proposed  anesthesia with the patient or authorized representative who has indicated his/her understanding and acceptance.     Plan Discussed with: CRNA and Anesthesiologist  Anesthesia Plan Comments:         Anesthesia Quick Evaluation

## 2017-07-29 NOTE — Transfer of Care (Signed)
Immediate Anesthesia Transfer of Care Note  Patient: Sydney Schultz  Procedure(s) Performed: CATARACT EXTRACTION PHACO AND INTRAOCULAR LENS PLACEMENT ([facility]) (Left Eye)  Patient Location: Short Stay  Anesthesia Type:MAC  Level of Consciousness: awake, alert , oriented and patient cooperative  Airway & Oxygen Therapy: Patient Spontanous Breathing  Post-op Assessment: Report given to RN and Post -op Vital signs reviewed and stable  Post vital signs: Reviewed and stable  Last Vitals:  Vitals:   07/29/17 0914  BP: (!) 152/63  Pulse: 76  Resp: 20  Temp: 36.9 C  SpO2: 100%    Last Pain:  Vitals:   07/29/17 0914  TempSrc: Tympanic         Complications: No apparent anesthesia complications

## 2017-07-29 NOTE — Anesthesia Post-op Follow-up Note (Signed)
Anesthesia QCDR form completed.        

## 2017-07-29 NOTE — Anesthesia Postprocedure Evaluation (Signed)
Anesthesia Post Note  Patient: [name]  Procedure(s) Performed: CATARACT EXTRACTION PHACO AND INTRAOCULAR LENS PLACEMENT ([place]) (Left Eye)  Patient location during evaluation: PACU Anesthesia Type: MAC Level of consciousness: awake and alert and oriented Pain management: pain level controlled Vital Signs Assessment: post-procedure vital signs reviewed and stable Respiratory status: spontaneous breathing, nonlabored ventilation and respiratory function stable Cardiovascular status: blood pressure returned to baseline and stable Postop Assessment: no signs of nausea or vomiting Anesthetic complications: no     Last Vitals:  Vitals:   07/29/17 1029 07/29/17 1036  BP: (!) 155/56 (!) 152/56  Pulse: 76 71  Resp: 16   Temp: (!) 36.4 C   SpO2: 100% 100%    Last Pain:  Vitals:   07/29/17 1029  TempSrc: Temporal                 Dorsey Charette

## 2017-08-13 ENCOUNTER — Encounter: Payer: Self-pay | Admitting: *Deleted

## 2017-08-19 ENCOUNTER — Ambulatory Visit
Admission: RE | Admit: 2017-08-19 | Discharge: 2017-08-19 | Disposition: A | Discharge status: Home / Self Care | Payer: Medicare Other | Source: Ambulatory Visit | Attending: Ophthalmology | Admitting: Ophthalmology

## 2017-08-19 ENCOUNTER — Encounter
Admission: RE | Disposition: A | Discharge status: Home / Self Care | Payer: Self-pay | Source: Ambulatory Visit | Attending: Ophthalmology

## 2017-08-19 ENCOUNTER — Other Ambulatory Visit: Payer: Self-pay

## 2017-08-19 ENCOUNTER — Ambulatory Visit: Payer: Medicare Other | Admitting: Certified Registered Nurse Anesthetist

## 2017-08-19 ENCOUNTER — Encounter: Payer: Self-pay | Admitting: *Deleted

## 2017-08-19 DIAGNOSIS — R32 Unspecified urinary incontinence: Secondary | ICD-10-CM | POA: Diagnosis not present

## 2017-08-19 DIAGNOSIS — E1136 Type 2 diabetes mellitus with diabetic cataract: Secondary | ICD-10-CM | POA: Diagnosis present

## 2017-08-19 DIAGNOSIS — I10 Essential (primary) hypertension: Secondary | ICD-10-CM | POA: Diagnosis not present

## 2017-08-19 DIAGNOSIS — Z7984 Long term (current) use of oral hypoglycemic drugs: Secondary | ICD-10-CM | POA: Insufficient documentation

## 2017-08-19 DIAGNOSIS — Z955 Presence of coronary angioplasty implant and graft: Secondary | ICD-10-CM | POA: Diagnosis not present

## 2017-08-19 DIAGNOSIS — M1990 Unspecified osteoarthritis, unspecified site: Secondary | ICD-10-CM | POA: Diagnosis not present

## 2017-08-19 DIAGNOSIS — Z7902 Long term (current) use of antithrombotics/antiplatelets: Secondary | ICD-10-CM | POA: Diagnosis not present

## 2017-08-19 DIAGNOSIS — Z79899 Other long term (current) drug therapy: Secondary | ICD-10-CM | POA: Diagnosis not present

## 2017-08-19 DIAGNOSIS — K219 Gastro-esophageal reflux disease without esophagitis: Secondary | ICD-10-CM | POA: Insufficient documentation

## 2017-08-19 DIAGNOSIS — G4730 Sleep apnea, unspecified: Secondary | ICD-10-CM | POA: Diagnosis not present

## 2017-08-19 DIAGNOSIS — H2511 Age-related nuclear cataract, right eye: Secondary | ICD-10-CM | POA: Insufficient documentation

## 2017-08-19 DIAGNOSIS — Z9842 Cataract extraction status, left eye: Secondary | ICD-10-CM | POA: Insufficient documentation

## 2017-08-19 HISTORY — PX: CATARACT EXTRACTION W/PHACO: SHX586

## 2017-08-19 HISTORY — DX: Wheezing: R06.2

## 2017-08-19 LAB — GLUCOSE, CAPILLARY: Glucose-Capillary: 143 mg/dL — ABNORMAL HIGH (ref 65–99)

## 2017-08-19 SURGERY — PHACOEMULSIFICATION, CATARACT, WITH IOL INSERTION
Anesthesia: Monitor Anesthesia Care | Site: Eye | Laterality: Right | Wound class: "Clean "

## 2017-08-19 MED ORDER — POVIDONE-IODINE 5 % OP SOLN
OPHTHALMIC | Status: AC
  Filled 2017-08-19: qty 30

## 2017-08-19 MED ORDER — MOXIFLOXACIN HCL 0.5 % OP SOLN
OPHTHALMIC | Status: AC
  Filled 2017-08-19: qty 3

## 2017-08-19 MED ORDER — LIDOCAINE HCL (PF) 4 % IJ SOLN
INTRAMUSCULAR | Status: DC | PRN
  Administered 2017-08-19: 2 mL via OPHTHALMIC

## 2017-08-19 MED ORDER — LIDOCAINE HCL (PF) 4 % IJ SOLN
INTRAMUSCULAR | Status: AC
  Filled 2017-08-19: qty 5

## 2017-08-19 MED ORDER — SODIUM CHLORIDE 0.9 % IV SOLN
INTRAVENOUS | Status: DC
  Administered 2017-08-19: 11:00:00 via INTRAVENOUS

## 2017-08-19 MED ORDER — MOXIFLOXACIN HCL 0.5 % OP SOLN
OPHTHALMIC | Status: DC | PRN
  Administered 2017-08-19: .2 mL via OPHTHALMIC

## 2017-08-19 MED ORDER — NA CHONDROIT SULF-NA HYALURON 40-17 MG/ML IO SOLN
INTRAOCULAR | Status: AC
  Filled 2017-08-19: qty 1

## 2017-08-19 MED ORDER — NA CHONDROIT SULF-NA HYALURON 40-17 MG/ML IO SOLN
INTRAOCULAR | Status: DC | PRN
  Administered 2017-08-19: 1 mL via INTRAOCULAR

## 2017-08-19 MED ORDER — FENTANYL CITRATE (PF) 100 MCG/2ML IJ SOLN
INTRAMUSCULAR | Status: AC
  Filled 2017-08-19: qty 2

## 2017-08-19 MED ORDER — EPINEPHRINE PF 1 MG/ML IJ SOLN
INTRAMUSCULAR | Status: AC
  Filled 2017-08-19: qty 1

## 2017-08-19 MED ORDER — FENTANYL CITRATE (PF) 100 MCG/2ML IJ SOLN
INTRAMUSCULAR | Status: DC | PRN
  Administered 2017-08-19: 25 ug via INTRAVENOUS

## 2017-08-19 MED ORDER — ARMC OPHTHALMIC DILATING DROPS
OPHTHALMIC | Status: AC
Start: 2017-08-19 — End: 2017-08-19
  Administered 2017-08-19: 1 via OPHTHALMIC
  Filled 2017-08-19: qty 0.4

## 2017-08-19 MED ORDER — MOXIFLOXACIN HCL 0.5 % OP SOLN: 1.0000 [drp] | OPHTHALMIC | Status: DC | PRN

## 2017-08-19 MED ORDER — ARMC OPHTHALMIC DILATING DROPS
1.0000 "application " | OPHTHALMIC | Status: AC
  Administered 2017-08-19 (×3): 1 via OPHTHALMIC

## 2017-08-19 MED ORDER — POVIDONE-IODINE 5 % OP SOLN
OPHTHALMIC | Status: DC | PRN
  Administered 2017-08-19: 1 via OPHTHALMIC

## 2017-08-19 MED ORDER — EPINEPHRINE PF 1 MG/ML IJ SOLN
INTRAOCULAR | Status: DC | PRN
  Administered 2017-08-19: 1 mL via OPHTHALMIC

## 2017-08-19 MED ORDER — CARBACHOL 0.01 % IO SOLN
INTRAOCULAR | Status: DC | PRN
  Administered 2017-08-19: .5 mL via INTRAOCULAR

## 2017-08-19 SURGICAL SUPPLY — 16 items
GLOVE BIO SURGEON STRL SZ8 (GLOVE) ×3 IMPLANT
GLOVE BIOGEL M 6.5 STRL (GLOVE) ×3 IMPLANT
GLOVE SURG LX 8.0 MICRO (GLOVE) ×2
GLOVE SURG LX STRL 8.0 MICRO (GLOVE) ×1 IMPLANT
GOWN STRL REUS W/ TWL LRG LVL3 (GOWN DISPOSABLE) ×2 IMPLANT
GOWN STRL REUS W/TWL LRG LVL3 (GOWN DISPOSABLE) ×4
LABEL CATARACT MEDS ST (LABEL) ×3 IMPLANT
LENS IOL TECNIS ITEC 20.5 (Intraocular Lens) ×2 IMPLANT
PACK CATARACT (MISCELLANEOUS) ×3 IMPLANT
PACK CATARACT BRASINGTON LX (MISCELLANEOUS) ×3 IMPLANT
PACK EYE AFTER SURG (MISCELLANEOUS) ×3 IMPLANT
SOL BSS BAG (MISCELLANEOUS) ×3
SOLUTION BSS BAG (MISCELLANEOUS) ×1 IMPLANT
SYR 5ML LL (SYRINGE) ×3 IMPLANT
WATER STERILE IRR 250ML POUR (IV SOLUTION) ×3 IMPLANT
WIPE NON LINTING 3.25X3.25 (MISCELLANEOUS) ×3 IMPLANT

## 2017-08-19 NOTE — Discharge Instructions (Signed)
FOLLOW DR. PORFILIO'S POSTOP EYE DROP INSTRUCTION SHEET AS REVIEWED.  Eye Surgery Discharge Instructions  Expect mild scratchy sensation or mild soreness. DO NOT RUB YOUR EYE!  The day of surgery:  Minimal physical activity, but bed rest is not required  No reading, computer work, or close hand work  No bending, lifting, or straining.  May watch TV  For 24 hours:  No driving, legal decisions, or alcoholic beverages  Safety precautions  Eat anything you prefer: It is better to start with liquids, then soup then solid foods.  _____ Eye patch should be worn until postoperative exam tomorrow.  ____ Solar shield eyeglasses should be worn for comfort in the sunlight/patch while sleeping  Resume all regular medications including aspirin or Coumadin if these were discontinued prior to surgery. You may shower, bathe, shave, or wash your hair. Tylenol may be taken for mild discomfort.  Call your doctor if you experience significant pain, nausea, or vomiting, fever > 101 or other signs of infection. [phone] or [phone] Specific instructions:  Follow-up Information    Galen ManilaPorfilio, William, MD Follow up.   Specialty:  Ophthalmology Why:  08/20/17 @ 10:55 am Contact information: [address] [place] [address] [phone]

## 2017-08-19 NOTE — Anesthesia Postprocedure Evaluation (Signed)
Anesthesia Post Note  Patient: Sydney Schultz  Procedure(s) Performed: CATARACT EXTRACTION PHACO AND INTRAOCULAR LENS PLACEMENT ([place]) (Right Eye)  Patient location during evaluation: Short Stay Anesthesia Type: MAC Level of consciousness: awake and alert Pain management: pain level controlled Vital Signs Assessment: post-procedure vital signs reviewed and stable Respiratory status: spontaneous breathing, nonlabored ventilation and respiratory function stable Cardiovascular status: blood pressure returned to baseline and stable Postop Assessment: no apparent nausea or vomiting Anesthetic complications: no     Last Vitals:  Vitals:   08/19/17 1128 08/19/17 1135  BP: 140/67 (!) 147/68  Pulse: 78 83  Resp: 14 14  Temp: 36.4 C   SpO2: 100% 100%    Last Pain:  Vitals:   08/19/17 1135  TempSrc:   PainSc: 0-No pain                 Alphonsus Sias

## 2017-08-19 NOTE — Op Note (Signed)
PREOPERATIVE DIAGNOSIS:  Nuclear sclerotic cataract of the right eye.   POSTOPERATIVE DIAGNOSIS:  nuclear sclerotic cataract right eye   OPERATIVE PROCEDURE: Procedure(s): CATARACT EXTRACTION PHACO AND INTRAOCULAR LENS PLACEMENT ([facility])   SURGEON:  Galen ManilaWilliam Jaegar Croft, MD.   ANESTHESIA:  Anesthesiologist: Christia ReadingHowell, Scott T, MD CRNA: Dava NajjarFrazier, Susan, CRNA Student Nurse Anesthetist: Leticia Classborne, Christy, RN  1.      Managed anesthesia care. 2.      0.851ml of Shugarcaine was instilled in the eye following the paracentesis.   COMPLICATIONS:  None.   TECHNIQUE:   Stop and chop   DESCRIPTION OF PROCEDURE:  The patient was examined and consented in the preoperative holding area where the aforementioned topical anesthesia was applied to the right eye and then brought back to the Operating Room where the right eye was prepped and draped in the usual sterile ophthalmic fashion and a lid speculum was placed. A paracentesis was created with the side port blade and the anterior chamber was filled with viscoelastic. A near clear corneal incision was performed with the steel keratome. A continuous curvilinear capsulorrhexis was performed with a cystotome followed by the capsulorrhexis forceps. Hydrodissection and hydrodelineation were carried out with BSS on a blunt cannula. The lens was removed in a stop and chop  technique and the remaining cortical material was removed with the irrigation-aspiration handpiece. The capsular bag was inflated with viscoelastic and the Technis ZCB00  lens was placed in the capsular bag without complication. The remaining viscoelastic was removed from the eye with the irrigation-aspiration handpiece. The wounds were hydrated. The anterior chamber was flushed with Miostat and the eye was inflated to physiologic pressure. 0.781ml of Vigamox was placed in the anterior chamber. The wounds were found to be water tight. The eye was dressed with Vigamox. The patient was given protective glasses to  wear throughout the day and a shield with which to sleep tonight. The patient was also given drops with which to begin a drop regimen today and will follow-up with me in one day. Implant Name Type Inv. Item Serial No. Manufacturer Lot No. LRB No. Used  LENS IOL DIOP 20.5 - [id number] Intraocular Lens LENS IOL DIOP 20.5 [id number] AMO  Right 1   Procedure(s) with comments: CATARACT EXTRACTION PHACO AND INTRAOCULAR LENS PLACEMENT ([facility]) (Right) - US 00:35 AP% 16.0 CDE 5.69 Fluid pack lot # [id number]  Electronically signed: Galen ManilaWilliam Daelyn Mozer 08/19/2017 11:27 AM

## 2017-08-19 NOTE — Transfer of Care (Signed)
Immediate Anesthesia Transfer of Care Note  Patient: Sydney Schultz  Procedure(s) Performed: CATARACT EXTRACTION PHACO AND INTRAOCULAR LENS PLACEMENT ([facility]) (Right Eye)  Patient Location: Short Stay  Anesthesia Type:MAC  Level of Consciousness: awake, alert  and oriented  Airway & Oxygen Therapy: Patient Spontanous Breathing  Post-op Assessment: Report given to RN and Post -op Vital signs reviewed and stable  Post vital signs: Reviewed and stable  Last Vitals:  Vitals Value Taken Time  BP 140/67 08/19/2017 11:28 AM  Temp 36.4 C 08/19/2017 11:28 AM  Pulse 78 08/19/2017 11:28 AM  Resp 14 08/19/2017 11:28 AM  SpO2 100 % 08/19/2017 11:28 AM    Last Pain:  Vitals:   08/19/17 1128  TempSrc: Temporal  PainSc: 0-No pain         Complications: No apparent anesthesia complications

## 2017-08-19 NOTE — Anesthesia Post-op Follow-up Note (Signed)
Anesthesia QCDR form completed.        

## 2017-08-19 NOTE — Anesthesia Preprocedure Evaluation (Addendum)
Anesthesia Evaluation  Patient identified by MRN, date of birth, ID band Patient awake    Reviewed: Allergy & Precautions, H&P , NPO status , reviewed documented beta blocker date and time   Airway Mallampati: III       Dental  (+) Teeth Intact Gapped:   Pulmonary shortness of breath, sleep apnea and Continuous Positive Airway Pressure Ventilation ,    Pulmonary exam normal        Cardiovascular hypertension, Normal cardiovascular exam     Neuro/Psych    GI/Hepatic GERD  ,  Endo/Other  diabetes  Renal/GU      Musculoskeletal   Abdominal   Peds  Hematology   Anesthesia Other Findings Past Medical History: No date: Arthritis No date: Diabetes ([place]) No date: Dyspnea     Comment:  with exertion No date: GERD (gastroesophageal reflux disease) No date: Hypertension No date: Incontinence No date: Sleep apnea     Comment:  uses cpap No date: Swelling of both lower extremities  Reproductive/Obstetrics                            Anesthesia Physical Anesthesia Plan  ASA: III  Anesthesia Plan: MAC   Post-op Pain Management:    Induction:   PONV Risk Score and Plan: 2 and TIVA  Airway Management Planned:   Additional Equipment:   Intra-op Plan:   Post-operative Plan:   Informed Consent: I have reviewed the patients History and Physical, chart, labs and discussed the procedure including the risks, benefits and alternatives for the proposed anesthesia with the patient or authorized representative who has indicated his/her understanding and acceptance.   Dental Advisory Given  Plan Discussed with: CRNA  Anesthesia Plan Comments:        Anesthesia Quick Evaluation

## 2017-08-19 NOTE — H&P (Signed)
All labs reviewed. Abnormal studies sent to patients PCP when indicated.  Previous H&P reviewed, patient examined, there are NO CHANGES.  Sydney Schultz Porfilio4/9/201911:05 AM

## 2018-01-07 ENCOUNTER — Encounter: Payer: Medicare Other | Attending: Internal Medicine | Admitting: Internal Medicine

## 2018-01-07 DIAGNOSIS — L97221 Non-pressure chronic ulcer of left calf limited to breakdown of skin: Secondary | ICD-10-CM | POA: Insufficient documentation

## 2018-01-07 DIAGNOSIS — E11610 Type 2 diabetes mellitus with diabetic neuropathic arthropathy: Secondary | ICD-10-CM | POA: Insufficient documentation

## 2018-01-07 DIAGNOSIS — I87332 Chronic venous hypertension (idiopathic) with ulcer and inflammation of left lower extremity: Secondary | ICD-10-CM | POA: Diagnosis not present

## 2018-01-07 DIAGNOSIS — Z7984 Long term (current) use of oral hypoglycemic drugs: Secondary | ICD-10-CM | POA: Diagnosis not present

## 2018-01-07 DIAGNOSIS — G4730 Sleep apnea, unspecified: Secondary | ICD-10-CM | POA: Diagnosis not present

## 2018-01-07 DIAGNOSIS — I890 Lymphedema, not elsewhere classified: Secondary | ICD-10-CM | POA: Diagnosis not present

## 2018-01-07 DIAGNOSIS — E11622 Type 2 diabetes mellitus with other skin ulcer: Secondary | ICD-10-CM | POA: Insufficient documentation

## 2018-01-07 DIAGNOSIS — I10 Essential (primary) hypertension: Secondary | ICD-10-CM | POA: Diagnosis not present

## 2018-07-19 ENCOUNTER — Emergency Department
Admission: EM | Admit: 2018-07-19 | Discharge: 2018-07-20 | Disposition: A | Discharge status: Home / Self Care | Payer: Medicare Other | Attending: Emergency Medicine | Admitting: Emergency Medicine

## 2018-07-19 ENCOUNTER — Other Ambulatory Visit: Payer: Self-pay

## 2018-07-19 DIAGNOSIS — Z9861 Coronary angioplasty status: Secondary | ICD-10-CM | POA: Insufficient documentation

## 2018-07-19 DIAGNOSIS — Z7982 Long term (current) use of aspirin: Secondary | ICD-10-CM | POA: Insufficient documentation

## 2018-07-19 DIAGNOSIS — Z7902 Long term (current) use of antithrombotics/antiplatelets: Secondary | ICD-10-CM | POA: Diagnosis not present

## 2018-07-19 DIAGNOSIS — Z79899 Other long term (current) drug therapy: Secondary | ICD-10-CM | POA: Insufficient documentation

## 2018-07-19 DIAGNOSIS — Z96653 Presence of artificial knee joint, bilateral: Secondary | ICD-10-CM | POA: Diagnosis not present

## 2018-07-19 DIAGNOSIS — E119 Type 2 diabetes mellitus without complications: Secondary | ICD-10-CM | POA: Insufficient documentation

## 2018-07-19 DIAGNOSIS — R404 Transient alteration of awareness: Secondary | ICD-10-CM | POA: Insufficient documentation

## 2018-07-19 DIAGNOSIS — Z7984 Long term (current) use of oral hypoglycemic drugs: Secondary | ICD-10-CM | POA: Insufficient documentation

## 2018-07-19 DIAGNOSIS — R4182 Altered mental status, unspecified: Secondary | ICD-10-CM | POA: Diagnosis present

## 2018-07-19 DIAGNOSIS — Z96611 Presence of right artificial shoulder joint: Secondary | ICD-10-CM | POA: Diagnosis not present

## 2018-07-19 DIAGNOSIS — I10 Essential (primary) hypertension: Secondary | ICD-10-CM | POA: Diagnosis not present

## 2018-07-19 LAB — URINALYSIS, COMPLETE (UACMP) WITH MICROSCOPIC
BACTERIA UA: NONE SEEN
Bilirubin Urine: NEGATIVE
Glucose, UA: NEGATIVE mg/dL
HGB URINE DIPSTICK: NEGATIVE
Ketones, ur: NEGATIVE mg/dL
Leukocytes,Ua: NEGATIVE
NITRITE: NEGATIVE
Protein, ur: NEGATIVE mg/dL
SPECIFIC GRAVITY, URINE: 1.016 (ref 1.005–1.030)
pH: 5 (ref 5.0–8.0)

## 2018-07-19 LAB — COMPREHENSIVE METABOLIC PANEL
ALK PHOS: 60 U/L (ref 38–126)
ALT: 13 U/L (ref 0–44)
ANION GAP: 10 (ref 5–15)
AST: 26 U/L (ref 15–41)
Albumin: 4.4 g/dL (ref 3.5–5.0)
BUN: 22 mg/dL (ref 8–23)
CALCIUM: 10 mg/dL (ref 8.9–10.3)
CO2: 28 mmol/L (ref 22–32)
CREATININE: 1.07 mg/dL — AB (ref 0.44–1.00)
Chloride: 104 mmol/L (ref 98–111)
GFR calc Af Amer: 55 mL/min — ABNORMAL LOW (ref >60)
GFR, EST NON AFRICAN AMERICAN: 48 mL/min — AB (ref >60)
Glucose, Bld: 150 mg/dL — ABNORMAL HIGH (ref 70–99)
Potassium: 3.2 mmol/L — ABNORMAL LOW (ref 3.5–5.1)
SODIUM: 142 mmol/L (ref 135–145)
Total Bilirubin: 0.7 mg/dL (ref 0.3–1.2)
Total Protein: 7.2 g/dL (ref 6.5–8.1)

## 2018-07-19 LAB — CBC WITH DIFFERENTIAL/PLATELET
Abs Immature Granulocytes: 0.02 10*3/uL (ref 0.00–0.07)
BASOS ABS: 0 10*3/uL (ref 0.0–0.1)
BASOS PCT: 1 %
EOS ABS: 0.2 10*3/uL (ref 0.0–0.5)
EOS PCT: 6 %
HCT: 35.6 % — ABNORMAL LOW (ref 36.0–46.0)
Hemoglobin: 11.3 g/dL — ABNORMAL LOW (ref 12.0–15.0)
Immature Granulocytes: 1 %
Lymphocytes Relative: 28 %
Lymphs Abs: 1.2 10*3/uL (ref 0.7–4.0)
MCH: 29 pg (ref 26.0–34.0)
MCHC: 31.7 g/dL (ref 30.0–36.0)
MCV: 91.3 fL (ref 80.0–100.0)
MONO ABS: 0.6 10*3/uL (ref 0.1–1.0)
Monocytes Relative: 15 %
NRBC: 0 % (ref 0.0–0.2)
Neutro Abs: 2.1 10*3/uL (ref 1.7–7.7)
Neutrophils Relative %: 49 %
PLATELETS: 217 10*3/uL (ref 150–400)
RBC: 3.9 MIL/uL (ref 3.87–5.11)
RDW: 13.6 % (ref 11.5–15.5)
WBC: 4.1 10*3/uL (ref 4.0–10.5)

## 2018-07-19 LAB — TROPONIN I: Troponin I: <0.03 ng/mL (ref <0.03)

## 2018-07-19 LAB — GLUCOSE, CAPILLARY: Glucose-Capillary: 123 mg/dL — ABNORMAL HIGH (ref 70–99)

## 2018-07-19 LAB — LIPASE, BLOOD: LIPASE: 20 U/L (ref 11–51)

## 2018-07-19 MED ORDER — SODIUM CHLORIDE 0.9 % IV BOLUS
500.0000 mL | Freq: Once | INTRAVENOUS | Status: AC
  Administered 2018-07-19: 500 mL via INTRAVENOUS

## 2018-07-19 NOTE — Discharge Instructions (Signed)
Your lab tests today were all okay. Continue taking your medications, and call neurology tomorrow for further evaluation.

## 2018-07-19 NOTE — ED Notes (Signed)
Patient's daughter reports that at approximately 30 minutes prior to arrival to this ED that patient had an 'episode'. The daughter reports that the patient was eating oatmeal, and suddenly dropped her arms to her sides and became nonverbal. The patient reportedly looked off into space and did not answer the daughters questions. The daughter says this episode lasted for approx 30 minutes, up until the point EMS arrived.   The patient is currently at her baseline per daughter.  The patient has had many of these episodes in the past, however, has not been evaluated by a medical professional for them. The daughter had previously attributed them to blood sugar issues, however, was unsure if that meant her sugar was low or high. She reports they do not take her mother's CBG at home, and would not know how to treat for either hypo or hyperglycemia.

## 2018-07-19 NOTE — ED Provider Notes (Signed)
[facility] Emergency Department Provider Note  ____________________________________________  Time seen: Approximately 11:14 PM  I have reviewed the triage vital signs and the nursing notes.   HISTORY  Chief Complaint Weakness and Altered Mental Status    HPI Sydney Schultz is a 85 y.o. female with a history of diabetes hypertension and GERD who is brought to the ED today  due to patient having a episode after eating where she seemed to stare off into space and become nonverbal for a period of time with her arms hanging at her side.  This lasted for a few minutes after which the patient seemed to return to normal.  On arrival to the ED she is back to normal.  Daughter reports that these episodes have been going on for years, patient has previously had a CT scan of her head about a year ago which was normal.  Normally they do not come to the hospital for this but when EMS arrived to evaluate the patient they thought that her blood sugar was very high so they recommended transport to the hospital and evaluation.  On arrival our blood sugar was about 100.  Patient denies any complaints and says that she feels like she is at her baseline.  No recent illness trauma or falls.  Denies chest pain shortness of breath headaches vision changes paresthesias or weakness.     Past Medical History:  Diagnosis Date  . Arthritis   . Diabetes (HCC)   . Dyspnea    with exertion  . GERD (gastroesophageal reflux disease)   . Hypertension   . Incontinence   . Sleep apnea    uses cpap  . Swelling of both lower extremities   . Wheezing      There are no active problems to display for this patient.    Past Surgical History:  Procedure Laterality Date  . ANKLE SURGERY    . CARDIAC CATHETERIZATION     coronary stent  . CARPAL TUNNEL RELEASE Right   . CATARACT EXTRACTION W/PHACO Left 07/29/2017   Procedure: CATARACT EXTRACTION PHACO AND INTRAOCULAR LENS PLACEMENT (IOC);   Surgeon: Galen Manila, MD;  Location: [facility] ORS;  Service: Ophthalmology;  Laterality: Left;  [place] 00:38.9 AP% 12.0 CDE 4.68 Fluid Pack Lot # [id number]  . CATARACT EXTRACTION W/PHACO Right 08/19/2017   Procedure: CATARACT EXTRACTION PHACO AND INTRAOCULAR LENS PLACEMENT (IOC);  Surgeon: Galen Manila, MD;  Location: [facility] ORS;  Service: Ophthalmology;  Laterality: Right;  [place] 00:35 AP% 16.0 CDE 5.69 Fluid pack lot # [id number]  . CESAREAN SECTION     x 2  . CORONARY ANGIOPLASTY     STENT  . FOOT SURGERY Left    ankle tendon surgery  . JOINT REPLACEMENT Bilateral    TKR/ RIGHT SHOULDER  . KNEE SURGERY Bilateral    total knee replacements  . SHOULDER SURGERY Right    shoulder replacement  . tumor removed     neck     Prior to Admission medications   Medication Sig Start Date End Date Taking? Authorizing Provider  acetaminophen (TYLENOL) 650 MG CR tablet Take 650 mg by mouth 2 (two) times daily.    [provider]  aspirin EC 81 MG tablet Take 81 mg by mouth daily.    [provider]  Biotin 1000 MCG tablet Take 1,000 mcg by mouth daily.    [provider]  calcium-vitamin D (OSCAL WITH D) 500-200 MG-UNIT tablet Take 1 tablet by mouth daily with  breakfast.    [provider]  clopidogrel (PLAVIX) 75 MG tablet Take 75 mg by mouth every evening.     [provider]  colestipol (COLESTID) 1 g tablet Take 2 g by mouth daily.    [provider]  ferrous gluconate ([employer] IRON) 240 (27 FE) MG tablet Take 240 mg by mouth daily.    [provider]  ferrous sulfate 325 (65 FE) MG tablet Take 325 mg by mouth daily with breakfast.    [provider]  furosemide (LASIX) 40 MG tablet Take 20 mg by mouth daily.     [provider]  hyoscyamine (LEVBID) 0.375 MG 12 hr tablet Take 0.375 mg by mouth every 12 (twelve) hours as needed for cramping.    [provider]  losartan-hydrochlorothiazide (HYZAAR) 100-25 MG  per tablet Take 1 tablet by mouth daily.  04/05/13   [provider]  meloxicam (MOBIC) 7.5 MG tablet Take 7.5 mg by mouth daily.  04/05/13   [provider]  metFORMIN (GLUCOPHAGE) 500 MG tablet Take 500,250 mg by mouth 2 (two) times daily with a meal. 500mg  in the morning and 250mg  at night    [provider]  oxybutynin (DITROPAN) 5 MG tablet Take 5 mg by mouth 2 (two) times daily.    [provider]  pantoprazole (PROTONIX) 40 MG tablet Take 40 mg by mouth daily.    [provider]  Potassium Gluconate 550 MG TABS Take 1 tablet by mouth daily. 595 mg    [provider]  vitamin E 400 UNIT capsule Take 400 Units by mouth daily.    [provider]  VYTORIN 10-40 MG per tablet Take 1 tablet by mouth daily at 6 PM.  04/05/13   [provider]     Allergies Patient has no known allergies.   No family history on file.  Social History Social History   Tobacco Use  . Smoking status: Never Smoker  . Smokeless tobacco: Never Used  Substance Use Topics  . Alcohol use: No  . Drug use: No    Review of Systems  Constitutional:   No fever or chills.  ENT:   No sore throat. No rhinorrhea. Cardiovascular:   No chest pain or syncope. Respiratory:   No dyspnea or cough. Gastrointestinal:   Negative for abdominal pain, vomiting and diarrhea.  Musculoskeletal:   Negative for focal pain or swelling All other systems reviewed and are negative except as documented above in ROS and HPI.  ____________________________________________   PHYSICAL EXAM:  VITAL SIGNS: ED Triage Vitals  Enc Vitals Group     BP 07/19/18 1937 (!) 72/40     Pulse Rate 07/19/18 1937 (!) 56     Resp 07/19/18 1937 17     Temp 07/19/18 1937 97.6 F (36.4 C)     Temp Source 07/19/18 1937 Oral     SpO2 07/19/18 1937 98 %     Weight --      Height 07/19/18 1947 4\' 11"  (1.499 m)     Head Circumference --      Peak Flow --      Pain Score --       Pain Loc --      Pain Edu? --      Excl. in [place]? --     Vital signs reviewed, nursing assessments reviewed.   Constitutional:   Alert and oriented. Non-toxic appearance. Eyes:   Conjunctivae are normal. EOMI. PERRL. ENT  Head:   Normocephalic and atraumatic.      Nose:   No congestion/rhinnorhea.       Mouth/Throat:   MMM, no pharyngeal erythema. No peritonsillar mass.       Neck:   No meningismus. Full ROM. Hematological/Lymphatic/Immunilogical:   No cervical lymphadenopathy. Cardiovascular:   RRR. Symmetric bilateral radial and DP pulses.  No murmurs. Cap refill less than 2 seconds. Respiratory:   Normal respiratory effort without tachypnea/retractions. Breath sounds are clear and equal bilaterally. No wheezes/rales/rhonchi. Gastrointestinal:   Soft and nontender. Non distended. There is no CVA tenderness.  No rebound, rigidity, or guarding. Musculoskeletal:   Normal range of motion in all extremities. No joint effusions.  No lower extremity tenderness.  No edema. Neurologic:   Normal speech and language.  Motor grossly intact. No acute focal neurologic deficits are appreciated.  Skin:    Skin is warm, dry and intact. No rash noted.  No petechiae, purpura, or bullae.  ____________________________________________    LABS (pertinent positives/negatives) (all labs ordered are listed, but only abnormal results are displayed) Labs Reviewed  GLUCOSE, CAPILLARY - Abnormal; Notable for the following components:      Result Value   Glucose-Capillary 123 (*)    All other components within normal limits  COMPREHENSIVE METABOLIC PANEL - Abnormal; Notable for the following components:   Potassium 3.2 (*)    Glucose, Bld 150 (*)    Creatinine, Ser 1.07 (*)    GFR calc non Af Amer 48 (*)    GFR calc Af Amer 55 (*)    All other components within normal limits  CBC WITH DIFFERENTIAL/PLATELET - Abnormal; Notable for the following components:   Hemoglobin 11.3 (*)    HCT 35.6 (*)     All other components within normal limits  URINALYSIS, COMPLETE (UACMP) WITH MICROSCOPIC - Abnormal; Notable for the following components:   Color, Urine YELLOW (*)    APPearance CLEAR (*)    All other components within normal limits  URINE CULTURE  LIPASE, BLOOD  TROPONIN I  TROPONIN I   ____________________________________________   EKG  Interpreted by me Sinus rhythm rate of 68, normal axis intervals QRS ST segments and T waves.  ____________________________________________    RADIOLOGY  No results found.  ____________________________________________   PROCEDURES Procedures  ____________________________________________  DIFFERENTIAL DIAGNOSIS   Dehydration, electrolyte abnormality, seizure  CLINICAL IMPRESSION / ASSESSMENT AND PLAN / ED COURSE  Medications ordered in the ED: Medications  sodium chloride 0.9 % bolus 500 mL (0 mLs Intravenous Stopped 07/19/18 2311)    Pertinent labs & imaging results that were available during my care of the patient were reviewed by me and considered in my medical decision making (see chart for details).    Patient is nontoxic, presents with a episode of altered mental status that was self-limited at home.  This is a chronic issue for her and she has followed up with her primary care doctor in the past regarding this issue.  According to family and patient she has not had a neurology evaluation in the past.  Reviewed electronic medical record, found CT scan of the head with and without contrast performed December 2018 which is unremarkable.  Labs are all unremarkable, patient is at baseline.  EKG and vital signs unremarkable.  I think she should follow-up with neurology for further evaluation of a possible underlying seizure disorder but do not see any evidence of acute cardiac, vascular, or stroke pathology.  Stable for discharge home.  ____________________________________________   FINAL CLINICAL IMPRESSION(S) / ED  DIAGNOSES    Final diagnoses:  Nonspecific paroxysmal spell     ED Discharge Orders    None      Portions of this note were generated with dragon dictation software. Dictation errors may occur despite best attempts at proofreading.   Sharman Cheek, MD 07/19/18 [phone]

## 2018-07-19 NOTE — ED Notes (Signed)
Reviewed discharge instructions, follow-up care with patient. Patient verbalized understanding of all information reviewed. Patient stable, with no distress noted at this time.    

## 2018-07-19 NOTE — ED Triage Notes (Signed)
Pt's daughter says pt had "an episode" at the house that included pt being non-verbal; did squeeze daughter's hand when asked by daughter; but pt is normally talking and walks by herself; pt with weakness and altered mental status; pt sitting in wheelchair with eyes sometimes open, then closed; not speaking when asked questions;

## 2018-07-19 NOTE — ED Triage Notes (Signed)
First nurse note: Per EMS report, patient felt weak during dinner and felt her blood sugar was low. Blood glucose per EMS report was 185. Patient denies pain.

## 2018-07-21 LAB — URINE CULTURE: Culture: NO GROWTH

## 2018-08-24 IMAGING — US US EXTREM LOW VENOUS BILAT
1 series · 13 of 24 positions shown · non-contrast
Comparison: None.

CLINICAL DATA: Bilateral lower extremity/foot edema for 2 days



[Series 1: us extrem low venous bilat · 0.07mm/px · 13 of 58 slices shown]
[im 1/58]
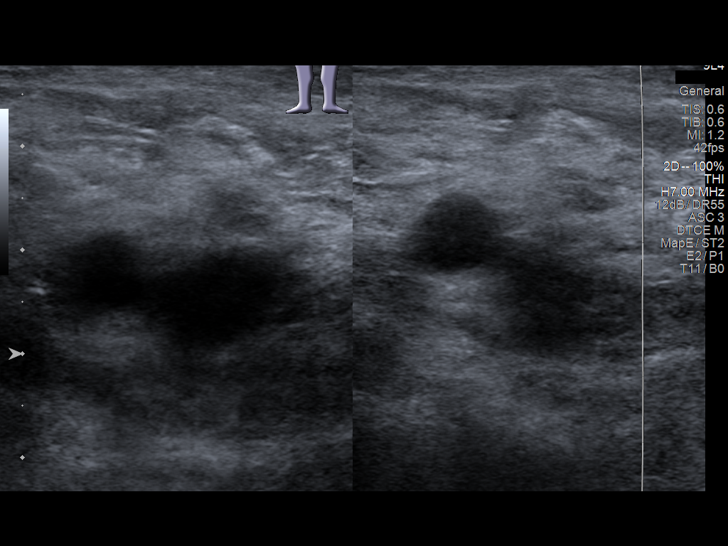
[im 5/58]
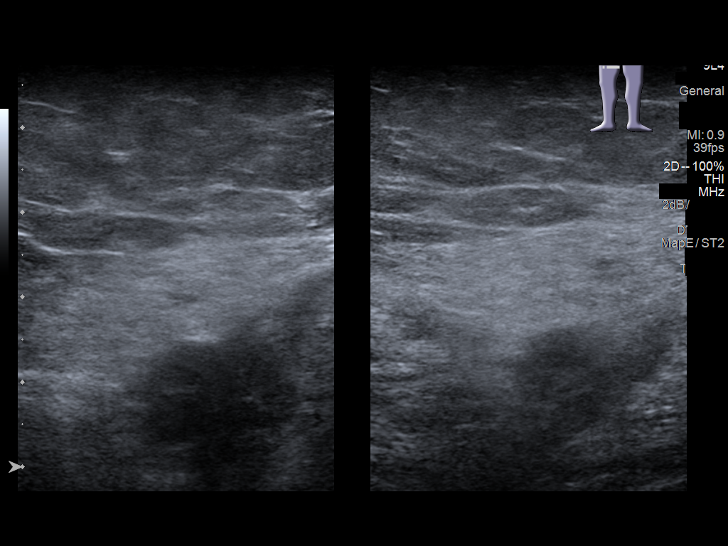
[im 10/58]
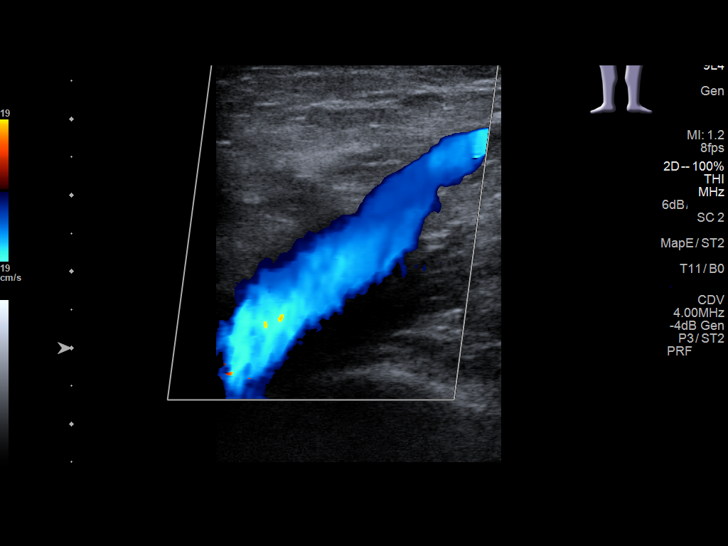
[im 15/58]
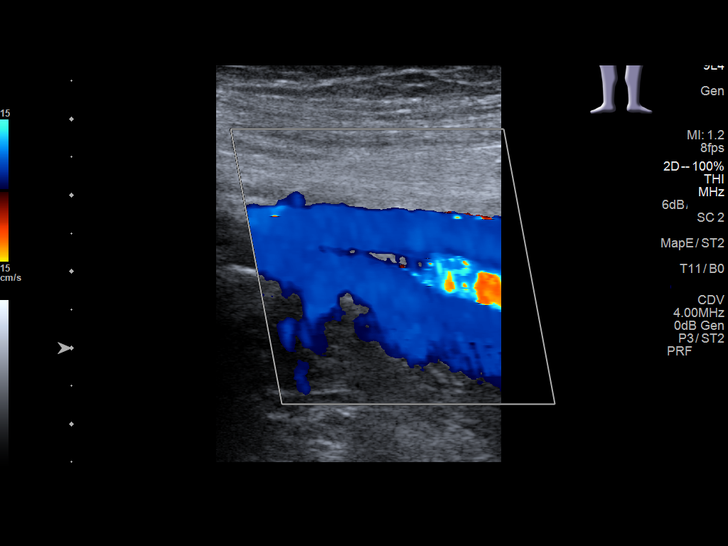
[im 20/58]
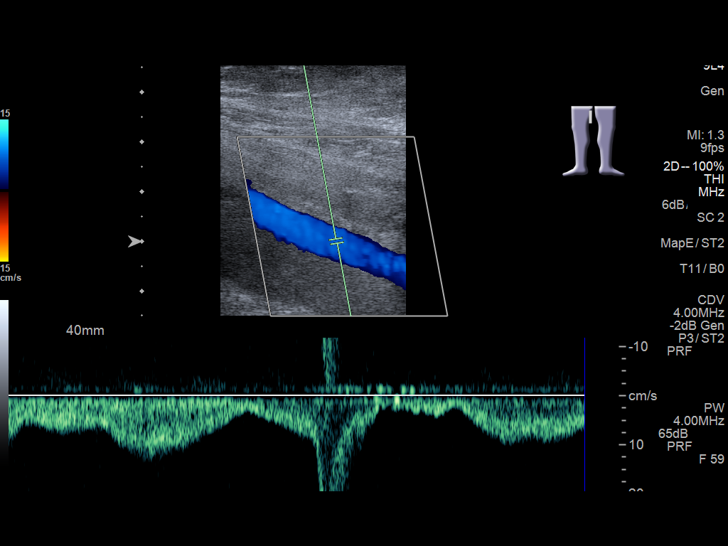
[im 25/58]
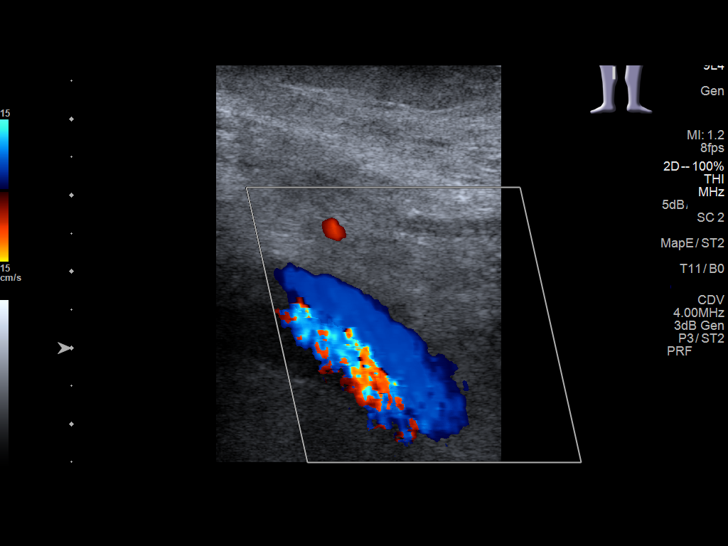
[im 30/58]
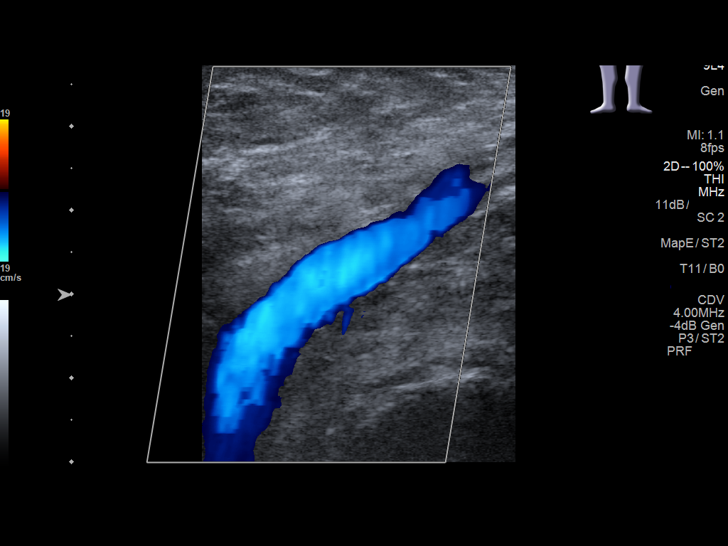
[im 33/58]
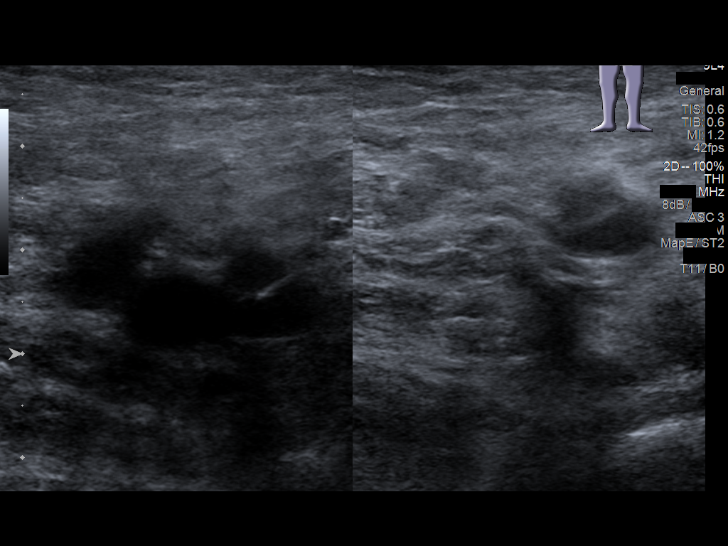
[im 38/58]
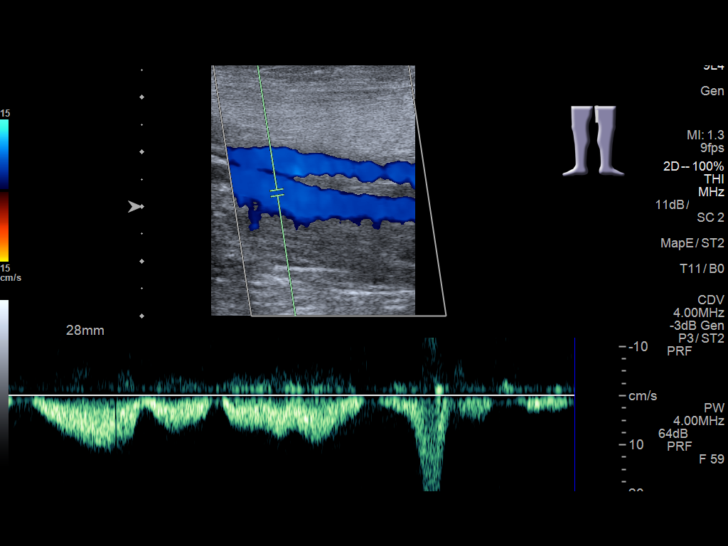
[im 43/58]
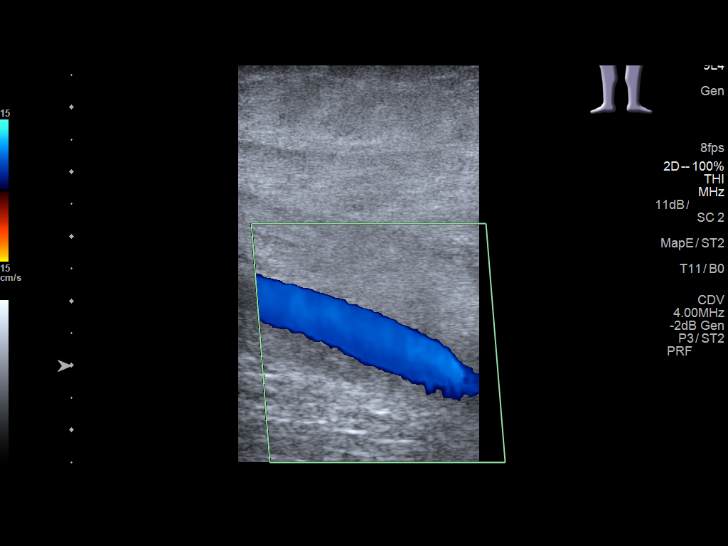
[im 48/58]
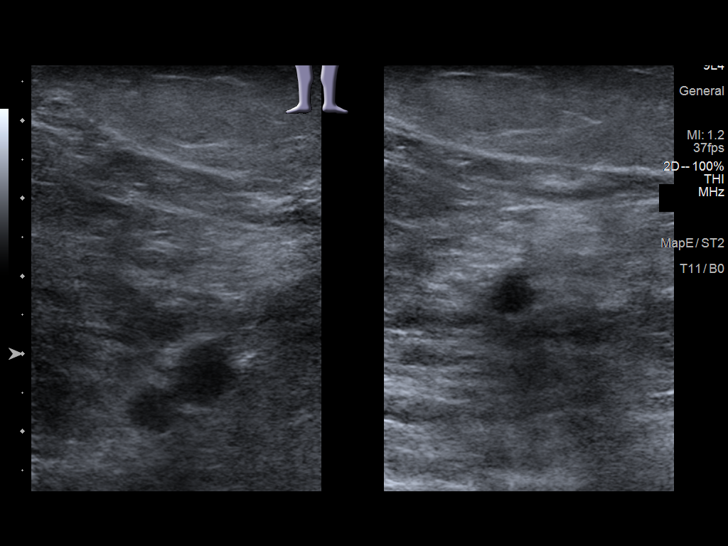
[im 53/58]
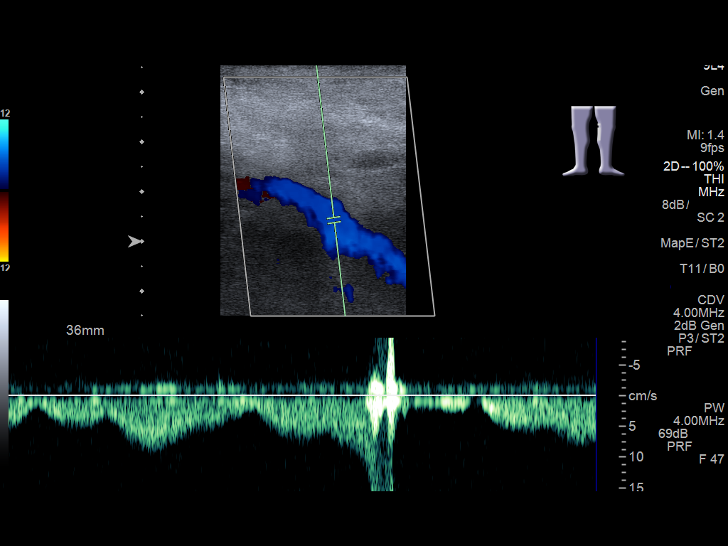
[im 58/58]
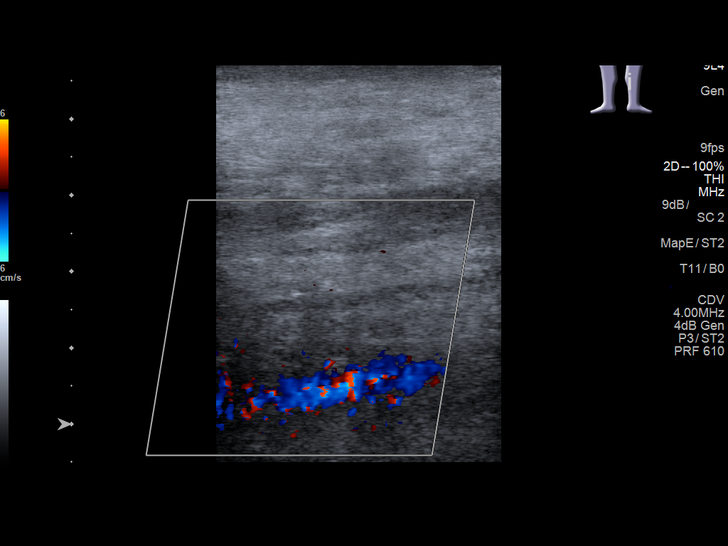

[13 of 24 positions shown; findings below may reference images not displayed]

FINDINGS: RIGHT LOWER EXTREMITY

Common Femoral Vein: No evidence of thrombus. Normal
compressibility, respiratory phasicity and response to augmentation.

Saphenofemoral Junction: No evidence of thrombus. Normal
compressibility and flow on color Doppler imaging.

Profunda Femoral Vein: No evidence of thrombus. Normal
compressibility and flow on color Doppler imaging.

Femoral Vein: No evidence of thrombus. Normal compressibility,
respiratory phasicity and response to augmentation.

Popliteal Vein: No evidence of thrombus. Normal compressibility,
respiratory phasicity and response to augmentation.

Calf Veins: No evidence of thrombus. Normal compressibility and flow
on color Doppler imaging.

Other Findings:  None.

LEFT LOWER EXTREMITY

Common Femoral Vein: No evidence of thrombus. Normal
compressibility, respiratory phasicity and response to augmentation.

Saphenofemoral Junction: No evidence of thrombus. Normal
compressibility and flow on color Doppler imaging.

Profunda Femoral Vein: No evidence of thrombus. Normal
compressibility and flow on color Doppler imaging.

Femoral Vein: No evidence of thrombus. Normal compressibility,
respiratory phasicity and response to augmentation.

Popliteal Vein: No evidence of thrombus. Normal compressibility,
respiratory phasicity and response to augmentation.

Calf Veins: No evidence of thrombus. Normal compressibility and flow
on color Doppler imaging.

Other Findings:  None.
IMPRESSION: No evidence of DVT within either lower extremity.

## 2018-10-18 ENCOUNTER — Emergency Department: Payer: Medicare Other

## 2018-10-18 ENCOUNTER — Encounter: Payer: Self-pay | Admitting: Emergency Medicine

## 2018-10-18 ENCOUNTER — Emergency Department
Admission: EM | Admit: 2018-10-18 | Discharge: 2018-10-18 | Disposition: A | Discharge status: Home / Self Care | Payer: Medicare Other | Attending: Emergency Medicine | Admitting: Emergency Medicine

## 2018-10-18 ENCOUNTER — Other Ambulatory Visit: Payer: Self-pay

## 2018-10-18 DIAGNOSIS — I10 Essential (primary) hypertension: Secondary | ICD-10-CM | POA: Insufficient documentation

## 2018-10-18 DIAGNOSIS — E119 Type 2 diabetes mellitus without complications: Secondary | ICD-10-CM | POA: Insufficient documentation

## 2018-10-18 DIAGNOSIS — R4182 Altered mental status, unspecified: Secondary | ICD-10-CM | POA: Diagnosis present

## 2018-10-18 DIAGNOSIS — R410 Disorientation, unspecified: Secondary | ICD-10-CM

## 2018-10-18 DIAGNOSIS — R531 Weakness: Secondary | ICD-10-CM | POA: Diagnosis not present

## 2018-10-18 LAB — URINALYSIS, COMPLETE (UACMP) WITH MICROSCOPIC
Bacteria, UA: NONE SEEN
Bilirubin Urine: NEGATIVE
Glucose, UA: >=500 mg/dL — AB
Hgb urine dipstick: NEGATIVE
Ketones, ur: NEGATIVE mg/dL
Leukocytes,Ua: NEGATIVE
Nitrite: NEGATIVE
Protein, ur: NEGATIVE mg/dL
Specific Gravity, Urine: 1.012 (ref 1.005–1.030)
pH: 5 (ref 5.0–8.0)

## 2018-10-18 LAB — COMPREHENSIVE METABOLIC PANEL
ALT: 15 U/L (ref 0–44)
AST: 28 U/L (ref 15–41)
Albumin: 4.2 g/dL (ref 3.5–5.0)
Alkaline Phosphatase: 62 U/L (ref 38–126)
Anion gap: 11 (ref 5–15)
BUN: 30 mg/dL — ABNORMAL HIGH (ref 8–23)
CO2: 28 mmol/L (ref 22–32)
Calcium: 10.1 mg/dL (ref 8.9–10.3)
Chloride: 99 mmol/L (ref 98–111)
Creatinine, Ser: 1.42 mg/dL — ABNORMAL HIGH (ref 0.44–1.00)
GFR calc Af Amer: 39 mL/min — ABNORMAL LOW (ref >60)
GFR calc non Af Amer: 34 mL/min — ABNORMAL LOW (ref >60)
Glucose, Bld: 180 mg/dL — ABNORMAL HIGH (ref 70–99)
Potassium: 3.8 mmol/L (ref 3.5–5.1)
Sodium: 138 mmol/L (ref 135–145)
Total Bilirubin: 0.9 mg/dL (ref 0.3–1.2)
Total Protein: 7 g/dL (ref 6.5–8.1)

## 2018-10-18 LAB — CBC WITH DIFFERENTIAL/PLATELET
Abs Immature Granulocytes: 0.01 10*3/uL (ref 0.00–0.07)
Basophils Absolute: 0 10*3/uL (ref 0.0–0.1)
Basophils Relative: 1 %
Eosinophils Absolute: 0.1 10*3/uL (ref 0.0–0.5)
Eosinophils Relative: 3 %
HCT: 37.5 % (ref 36.0–46.0)
Hemoglobin: 12 g/dL (ref 12.0–15.0)
Immature Granulocytes: 0 %
Lymphocytes Relative: 22 %
Lymphs Abs: 0.8 10*3/uL (ref 0.7–4.0)
MCH: 29 pg (ref 26.0–34.0)
MCHC: 32 g/dL (ref 30.0–36.0)
MCV: 90.6 fL (ref 80.0–100.0)
Monocytes Absolute: 0.5 10*3/uL (ref 0.1–1.0)
Monocytes Relative: 12 %
Neutro Abs: 2.3 10*3/uL (ref 1.7–7.7)
Neutrophils Relative %: 62 %
Platelets: 218 10*3/uL (ref 150–400)
RBC: 4.14 MIL/uL (ref 3.87–5.11)
RDW: 13.2 % (ref 11.5–15.5)
WBC: 3.7 10*3/uL — ABNORMAL LOW (ref 4.0–10.5)
nRBC: 0 % (ref 0.0–0.2)

## 2018-10-18 LAB — GLUCOSE, CAPILLARY: Glucose-Capillary: 163 mg/dL — ABNORMAL HIGH (ref 70–99)

## 2018-10-18 LAB — TROPONIN I: Troponin I: <0.03 ng/mL (ref <0.03)

## 2018-10-18 NOTE — ED Notes (Signed)
XR in room 

## 2018-10-18 NOTE — ED Triage Notes (Signed)
Pt brought over from [facility] for sudden onset confusion. Pt was at [facility] clinic for EEG, pt daughter reports that from the car until she got into [facility] she became confused and was not able to walk. EEG tech from [facility] reports that was very lethargic and was unable to get into a wheelchair on her own. Pt is A & O to person and placed only. Pt daughter reports pt has been holding her head stating that she needs to go to the doctor.

## 2018-10-18 NOTE — ED Provider Notes (Signed)
[facility] Emergency Department Provider Note       Time seen: ----------------------------------------- 1:40 PM on 10/18/2018 -----------------------------------------   I have reviewed the triage vital signs and the nursing notes.  HISTORY   Chief Complaint Altered Mental Status   HPI Sydney Schultz is a 85 y.o. female with a history of arthritis, diabetes, GERD, hypertension, sleep apnea who presents to the ED for confusion.  [facility] onset of confusion.  [facility].  Daughter reports that from the car until she got [facility] she was confused and was not able to walk.  She was unable to get into the wheelchair on her own.  She was alert and oriented to person and place only.  She denies any other complaints at this time.  She denies any recent illness or change in her medicines.  Past Medical History:  Diagnosis Date  . Arthritis   . Diabetes (HCC)   . Dyspnea    with exertion  . GERD (gastroesophageal reflux disease)   . Hypertension   . Incontinence   . Sleep apnea    uses cpap  . Swelling of both lower extremities   . Wheezing     There are no active problems to display for this patient.   Past Surgical History:  Procedure Laterality Date  . ANKLE SURGERY    . CARDIAC CATHETERIZATION     coronary stent  . CARPAL TUNNEL RELEASE Right   . CATARACT EXTRACTION W/PHACO Left 07/29/2017   Procedure: CATARACT EXTRACTION PHACO AND INTRAOCULAR LENS PLACEMENT (IOC);  Surgeon: Galen ManilaPorfilio, William, MD;  Location: [facility] ORS;  Service: Ophthalmology;  Laterality: Left;  [place] 00:38.9 AP% 12.0 CDE 4.68 Fluid Pack Lot # [id number]  . CATARACT EXTRACTION W/PHACO Right 08/19/2017   Procedure: CATARACT EXTRACTION PHACO AND INTRAOCULAR LENS PLACEMENT (IOC);  Surgeon: Galen ManilaPorfilio, William, MD;  Location: [facility] ORS;  Service: Ophthalmology;  Laterality: Right;  US 00:35 AP% 16.0 CDE 5.69 Fluid  pack lot # [id number]  . CESAREAN SECTION     x 2  . CORONARY ANGIOPLASTY     STENT  . FOOT SURGERY Left    ankle tendon surgery  . JOINT REPLACEMENT Bilateral    TKR/ RIGHT SHOULDER  . KNEE SURGERY Bilateral    total knee replacements  . SHOULDER SURGERY Right    shoulder replacement  . tumor removed     neck    Allergies Patient has no known allergies.  Social History Social History   Tobacco Use  . Smoking status: Never Smoker  . Smokeless tobacco: Never Used  Substance Use Topics  . Alcohol use: No  . Drug use: No   Review of Systems Constitutional: Negative for fever. Cardiovascular: Negative for chest pain. Respiratory: Negative for shortness of breath. Gastrointestinal: Negative for abdominal pain, vomiting and diarrhea. Musculoskeletal: Negative for back pain. Skin: Negative for rash. Neurological: Positive for weakness and confusion  All systems negative/normal/unremarkable except as stated in the HPI  ____________________________________________   PHYSICAL EXAM:  VITAL SIGNS: ED Triage Vitals  Enc Vitals Group     BP      Pulse      Resp      Temp      Temp src      SpO2      Weight      Height      Head Circumference      Peak Flow  Pain Score      Pain Loc      Pain Edu?      Excl. in [place]?    Constitutional: Alert. Well appearing and in no distress. Eyes: Conjunctivae are normal. Normal extraocular movements. ENT      Head: Normocephalic and atraumatic.      Nose: No congestion/rhinnorhea.      Mouth/Throat: Mucous membranes are moist.      Neck: No stridor. Cardiovascular: Normal rate, regular rhythm. No murmurs, rubs, or gallops. Respiratory: Normal respiratory effort without tachypnea nor retractions. Breath sounds are clear and equal bilaterally. No wheezes/rales/rhonchi. Gastrointestinal: Soft and nontender. Normal bowel sounds Musculoskeletal: Nontender with normal range of motion in extremities. No lower extremity  tenderness nor edema. Neurologic:  Normal speech and language. No gross focal neurologic deficits are appreciated.  Skin:  Skin is warm, dry and intact. No rash noted. Psychiatric: Mood and affect are normal. Speech and behavior are normal.  ____________________________________________  EKG: Interpreted by me.  Sinus rhythm with a rate of 78 bpm, nonspecific T wave abnormalities, normal axis, normal QT  ____________________________________________  ED COURSE:  As part of my medical decision making, I reviewed the following data within the [place] History obtained from family if available, nursing notes, old chart and ekg, as well as notes from prior ED visits. Patient presented for confusion or altered mental status, we will assess with labs and imaging as indicated at this time.   Procedures  Sydney Schultz was evaluated in Emergency Department on 10/18/2018 for the symptoms described in the history of present illness. She was evaluated in the context of the global COVID-19 pandemic, which necessitated consideration that the patient might be at risk for infection with the SARS-CoV-2 virus that causes COVID-19. Institutional protocols and algorithms that pertain to the evaluation of patients at risk for COVID-19 are in a state of rapid change based on information released by regulatory bodies including the CDC and federal and state organizations. These policies and algorithms were followed during the patient's care in the ED.  ____________________________________________   LABS (pertinent positives/negatives)  Labs Reviewed  CBC WITH DIFFERENTIAL/PLATELET - Abnormal; Notable for the following components:      Result Value   WBC 3.7 (*)    All other components within normal limits  URINALYSIS, COMPLETE (UACMP) WITH MICROSCOPIC  COMPREHENSIVE METABOLIC PANEL  TROPONIN I    RADIOLOGY Images were viewed by me  CT head IMPRESSION: 1. No acute intracranial  abnormality. 2. Stable atrophy, ventriculomegaly and moderately advanced chronic microvascular ischemic white matter disease. ____________________________________________   DIFFERENTIAL DIAGNOSIS   Dementia, seizure, dehydration, electrolyte abnormality, occult infection  FINAL ASSESSMENT AND PLAN  Confusion   Plan: The patient had presented for confusion. Patient's labs are still pending at this time. Patient's imaging initially has not revealed any acute process.  Final disposition is pending at this time.   Laurence Aly, MD    Note: This note was generated in part or whole with voice recognition software. Voice recognition is usually quite accurate but there are transcription errors that can and very often do occur. I apologize for any typographical errors that were not detected and corrected.     Earleen Newport, MD 10/18/18 1450

## 2018-10-18 NOTE — Discharge Instructions (Addendum)
The test that we were able to do today in the emergency room came out looking okay.  I cannot do an EEG in the emergency room today.  Please call your primary care doctor and have the EEG rescheduled at [facility] clinic.  They can also order the MRI for you.  Please return for any further problems

## 2020-01-16 ENCOUNTER — Inpatient Hospital Stay (HOSPITAL_COMMUNITY): Payer: Medicare PPO

## 2020-01-16 ENCOUNTER — Encounter (HOSPITAL_COMMUNITY): Payer: Self-pay | Admitting: Emergency Medicine

## 2020-01-16 ENCOUNTER — Other Ambulatory Visit: Payer: Self-pay

## 2020-01-16 ENCOUNTER — Emergency Department (HOSPITAL_COMMUNITY): Payer: Medicare PPO

## 2020-01-16 ENCOUNTER — Inpatient Hospital Stay (HOSPITAL_COMMUNITY)
Admission: EM | Admit: 2020-01-16 | Discharge: 2020-02-04 | DRG: 064 | Disposition: A | Discharge status: Skilled Nursing Facility | Payer: Medicare PPO | Attending: Internal Medicine | Admitting: Internal Medicine

## 2020-01-16 DIAGNOSIS — D638 Anemia in other chronic diseases classified elsewhere: Secondary | ICD-10-CM | POA: Diagnosis present

## 2020-01-16 DIAGNOSIS — E1159 Type 2 diabetes mellitus with other circulatory complications: Secondary | ICD-10-CM | POA: Diagnosis not present

## 2020-01-16 DIAGNOSIS — N182 Chronic kidney disease, stage 2 (mild): Secondary | ICD-10-CM

## 2020-01-16 DIAGNOSIS — R059 Cough, unspecified: Secondary | ICD-10-CM

## 2020-01-16 DIAGNOSIS — I340 Nonrheumatic mitral (valve) insufficiency: Secondary | ICD-10-CM | POA: Diagnosis not present

## 2020-01-16 DIAGNOSIS — I350 Nonrheumatic aortic (valve) stenosis: Secondary | ICD-10-CM | POA: Diagnosis not present

## 2020-01-16 DIAGNOSIS — E11649 Type 2 diabetes mellitus with hypoglycemia without coma: Secondary | ICD-10-CM | POA: Diagnosis not present

## 2020-01-16 DIAGNOSIS — Z8673 Personal history of transient ischemic attack (TIA), and cerebral infarction without residual deficits: Secondary | ICD-10-CM

## 2020-01-16 DIAGNOSIS — N390 Urinary tract infection, site not specified: Secondary | ICD-10-CM | POA: Diagnosis not present

## 2020-01-16 DIAGNOSIS — R471 Dysarthria and anarthria: Secondary | ICD-10-CM | POA: Diagnosis present

## 2020-01-16 DIAGNOSIS — R4781 Slurred speech: Secondary | ICD-10-CM | POA: Diagnosis present

## 2020-01-16 DIAGNOSIS — Z66 Do not resuscitate: Secondary | ICD-10-CM | POA: Diagnosis present

## 2020-01-16 DIAGNOSIS — F0390 Unspecified dementia without behavioral disturbance: Secondary | ICD-10-CM | POA: Diagnosis present

## 2020-01-16 DIAGNOSIS — I7389 Other specified peripheral vascular diseases: Secondary | ICD-10-CM | POA: Diagnosis present

## 2020-01-16 DIAGNOSIS — Z96653 Presence of artificial knee joint, bilateral: Secondary | ICD-10-CM | POA: Diagnosis present

## 2020-01-16 DIAGNOSIS — R451 Restlessness and agitation: Secondary | ICD-10-CM | POA: Diagnosis present

## 2020-01-16 DIAGNOSIS — E1165 Type 2 diabetes mellitus with hyperglycemia: Secondary | ICD-10-CM | POA: Diagnosis present

## 2020-01-16 DIAGNOSIS — G4733 Obstructive sleep apnea (adult) (pediatric): Secondary | ICD-10-CM | POA: Diagnosis present

## 2020-01-16 DIAGNOSIS — R29810 Facial weakness: Secondary | ICD-10-CM | POA: Diagnosis present

## 2020-01-16 DIAGNOSIS — I129 Hypertensive chronic kidney disease with stage 1 through stage 4 chronic kidney disease, or unspecified chronic kidney disease: Secondary | ICD-10-CM | POA: Diagnosis present

## 2020-01-16 DIAGNOSIS — I639 Cerebral infarction, unspecified: Secondary | ICD-10-CM | POA: Diagnosis present

## 2020-01-16 DIAGNOSIS — Z96612 Presence of left artificial shoulder joint: Secondary | ICD-10-CM | POA: Diagnosis present

## 2020-01-16 DIAGNOSIS — I6340 Cerebral infarction due to embolism of unspecified cerebral artery: Secondary | ICD-10-CM | POA: Diagnosis not present

## 2020-01-16 DIAGNOSIS — Z7902 Long term (current) use of antithrombotics/antiplatelets: Secondary | ICD-10-CM

## 2020-01-16 DIAGNOSIS — B9620 Unspecified Escherichia coli [E. coli] as the cause of diseases classified elsewhere: Secondary | ICD-10-CM | POA: Diagnosis not present

## 2020-01-16 DIAGNOSIS — L89150 Pressure ulcer of sacral region, unstageable: Secondary | ICD-10-CM | POA: Diagnosis not present

## 2020-01-16 DIAGNOSIS — G9341 Metabolic encephalopathy: Secondary | ICD-10-CM | POA: Diagnosis not present

## 2020-01-16 DIAGNOSIS — Z431 Encounter for attention to gastrostomy: Secondary | ICD-10-CM

## 2020-01-16 DIAGNOSIS — E1122 Type 2 diabetes mellitus with diabetic chronic kidney disease: Secondary | ICD-10-CM | POA: Diagnosis present

## 2020-01-16 DIAGNOSIS — E860 Dehydration: Secondary | ICD-10-CM

## 2020-01-16 DIAGNOSIS — R739 Hyperglycemia, unspecified: Secondary | ICD-10-CM | POA: Diagnosis not present

## 2020-01-16 DIAGNOSIS — I342 Nonrheumatic mitral (valve) stenosis: Secondary | ICD-10-CM | POA: Diagnosis not present

## 2020-01-16 DIAGNOSIS — E119 Type 2 diabetes mellitus without complications: Secondary | ICD-10-CM

## 2020-01-16 DIAGNOSIS — Z955 Presence of coronary angioplasty implant and graft: Secondary | ICD-10-CM

## 2020-01-16 DIAGNOSIS — L89156 Pressure-induced deep tissue damage of sacral region: Secondary | ICD-10-CM | POA: Diagnosis present

## 2020-01-16 DIAGNOSIS — K219 Gastro-esophageal reflux disease without esophagitis: Secondary | ICD-10-CM | POA: Diagnosis present

## 2020-01-16 DIAGNOSIS — I10 Essential (primary) hypertension: Secondary | ICD-10-CM | POA: Diagnosis present

## 2020-01-16 DIAGNOSIS — E785 Hyperlipidemia, unspecified: Secondary | ICD-10-CM | POA: Diagnosis present

## 2020-01-16 DIAGNOSIS — I63431 Cerebral infarction due to embolism of right posterior cerebral artery: Principal | ICD-10-CM | POA: Diagnosis present

## 2020-01-16 DIAGNOSIS — Z96611 Presence of right artificial shoulder joint: Secondary | ICD-10-CM | POA: Diagnosis present

## 2020-01-16 DIAGNOSIS — N1832 Chronic kidney disease, stage 3b: Secondary | ICD-10-CM | POA: Diagnosis present

## 2020-01-16 DIAGNOSIS — R1312 Dysphagia, oropharyngeal phase: Secondary | ICD-10-CM | POA: Diagnosis present

## 2020-01-16 DIAGNOSIS — Z7984 Long term (current) use of oral hypoglycemic drugs: Secondary | ICD-10-CM

## 2020-01-16 DIAGNOSIS — I159 Secondary hypertension, unspecified: Secondary | ICD-10-CM | POA: Diagnosis not present

## 2020-01-16 DIAGNOSIS — Z79899 Other long term (current) drug therapy: Secondary | ICD-10-CM

## 2020-01-16 DIAGNOSIS — Z7982 Long term (current) use of aspirin: Secondary | ICD-10-CM

## 2020-01-16 DIAGNOSIS — E440 Moderate protein-calorie malnutrition: Secondary | ICD-10-CM | POA: Insufficient documentation

## 2020-01-16 DIAGNOSIS — Z20822 Contact with and (suspected) exposure to covid-19: Secondary | ICD-10-CM | POA: Diagnosis present

## 2020-01-16 DIAGNOSIS — I6319 Cerebral infarction due to embolism of other precerebral artery: Secondary | ICD-10-CM

## 2020-01-16 DIAGNOSIS — R1310 Dysphagia, unspecified: Secondary | ICD-10-CM

## 2020-01-16 DIAGNOSIS — Z6821 Body mass index (BMI) 21.0-21.9, adult: Secondary | ICD-10-CM

## 2020-01-16 DIAGNOSIS — D649 Anemia, unspecified: Secondary | ICD-10-CM | POA: Diagnosis not present

## 2020-01-16 DIAGNOSIS — E162 Hypoglycemia, unspecified: Secondary | ICD-10-CM | POA: Diagnosis not present

## 2020-01-16 LAB — CBC
HCT: 45.4 % (ref 36.0–46.0)
Hemoglobin: 13.8 g/dL (ref 12.0–15.0)
MCH: 29.6 pg (ref 26.0–34.0)
MCHC: 30.4 g/dL (ref 30.0–36.0)
MCV: 97.2 fL (ref 80.0–100.0)
Platelets: 257 10*3/uL (ref 150–400)
RBC: 4.67 MIL/uL (ref 3.87–5.11)
RDW: 13.9 % (ref 11.5–15.5)
WBC: 9.6 10*3/uL (ref 4.0–10.5)
nRBC: 0 % (ref 0.0–0.2)

## 2020-01-16 LAB — BASIC METABOLIC PANEL
Anion gap: 16 — ABNORMAL HIGH (ref 5–15)
BUN: 16 mg/dL (ref 8–23)
CO2: 20 mmol/L — ABNORMAL LOW (ref 22–32)
Calcium: 10.8 mg/dL — ABNORMAL HIGH (ref 8.9–10.3)
Chloride: 107 mmol/L (ref 98–111)
Creatinine, Ser: 1.3 mg/dL — ABNORMAL HIGH (ref 0.44–1.00)
GFR calc Af Amer: 43 mL/min — ABNORMAL LOW (ref >60)
GFR calc non Af Amer: 37 mL/min — ABNORMAL LOW (ref >60)
Glucose, Bld: 141 mg/dL — ABNORMAL HIGH (ref 70–99)
Potassium: 3.7 mmol/L (ref 3.5–5.1)
Sodium: 143 mmol/L (ref 135–145)

## 2020-01-16 LAB — DIFFERENTIAL
Abs Immature Granulocytes: 0.06 10*3/uL (ref 0.00–0.07)
Basophils Absolute: 0 10*3/uL (ref 0.0–0.1)
Basophils Relative: 0 %
Eosinophils Absolute: 0 10*3/uL (ref 0.0–0.5)
Eosinophils Relative: 0 %
Immature Granulocytes: 1 %
Lymphocytes Relative: 10 %
Lymphs Abs: 0.9 10*3/uL (ref 0.7–4.0)
Monocytes Absolute: 0.8 10*3/uL (ref 0.1–1.0)
Monocytes Relative: 8 %
Neutro Abs: 7.8 10*3/uL — ABNORMAL HIGH (ref 1.7–7.7)
Neutrophils Relative %: 81 %

## 2020-01-16 LAB — COMPREHENSIVE METABOLIC PANEL
ALT: 19 U/L (ref 0–44)
AST: 33 U/L (ref 15–41)
Albumin: 4.3 g/dL (ref 3.5–5.0)
Alkaline Phosphatase: 101 U/L (ref 38–126)
Anion gap: 17 — ABNORMAL HIGH (ref 5–15)
BUN: 16 mg/dL (ref 8–23)
CO2: 17 mmol/L — ABNORMAL LOW (ref 22–32)
Calcium: 10.8 mg/dL — ABNORMAL HIGH (ref 8.9–10.3)
Chloride: 107 mmol/L (ref 98–111)
Creatinine, Ser: 1.5 mg/dL — ABNORMAL HIGH (ref 0.44–1.00)
GFR calc Af Amer: 36 mL/min — ABNORMAL LOW (ref >60)
GFR calc non Af Amer: 31 mL/min — ABNORMAL LOW (ref >60)
Glucose, Bld: 244 mg/dL — ABNORMAL HIGH (ref 70–99)
Potassium: 3.5 mmol/L (ref 3.5–5.1)
Sodium: 141 mmol/L (ref 135–145)
Total Bilirubin: 0.7 mg/dL (ref 0.3–1.2)
Total Protein: 7.1 g/dL (ref 6.5–8.1)

## 2020-01-16 LAB — I-STAT CHEM 8, ED
BUN: 18 mg/dL (ref 8–23)
Calcium, Ion: 1.35 mmol/L (ref 1.15–1.40)
Chloride: 108 mmol/L (ref 98–111)
Creatinine, Ser: 1.3 mg/dL — ABNORMAL HIGH (ref 0.44–1.00)
Glucose, Bld: 221 mg/dL — ABNORMAL HIGH (ref 70–99)
HCT: 46 % (ref 36.0–46.0)
Hemoglobin: 15.6 g/dL — ABNORMAL HIGH (ref 12.0–15.0)
Potassium: 3.4 mmol/L — ABNORMAL LOW (ref 3.5–5.1)
Sodium: 143 mmol/L (ref 135–145)
TCO2: 18 mmol/L — ABNORMAL LOW (ref 22–32)

## 2020-01-16 LAB — LIPID PANEL
Cholesterol: 209 mg/dL — ABNORMAL HIGH (ref 0–200)
HDL: 64 mg/dL (ref >40)
LDL Cholesterol: 131 mg/dL — ABNORMAL HIGH (ref 0–99)
Total CHOL/HDL Ratio: 3.3 RATIO
Triglycerides: 68 mg/dL (ref <150)
VLDL: 14 mg/dL (ref 0–40)

## 2020-01-16 LAB — CBG MONITORING, ED
Glucose-Capillary: 187 mg/dL — ABNORMAL HIGH (ref 70–99)
Glucose-Capillary: 104 mg/dL — ABNORMAL HIGH (ref 70–99)
Glucose-Capillary: 93 mg/dL (ref 70–99)
Glucose-Capillary: 83 mg/dL (ref 70–99)

## 2020-01-16 LAB — PROTIME-INR
INR: 1.1 (ref 0.8–1.2)
Prothrombin Time: 13.6 seconds (ref 11.4–15.2)

## 2020-01-16 LAB — SARS CORONAVIRUS 2 BY RT PCR (HOSPITAL ORDER, PERFORMED IN ~~LOC~~ HOSPITAL LAB): SARS Coronavirus 2: NEGATIVE

## 2020-01-16 LAB — GLUCOSE, CAPILLARY: Glucose-Capillary: 96 mg/dL (ref 70–99)

## 2020-01-16 LAB — HEMOGLOBIN A1C
Hgb A1c MFr Bld: 5.6 % (ref 4.8–5.6)
Mean Plasma Glucose: 114.02 mg/dL

## 2020-01-16 LAB — APTT: aPTT: 30 seconds (ref 24–36)

## 2020-01-16 MED ORDER — ACETAMINOPHEN 650 MG RE SUPP: 650.0000 mg | RECTAL | Status: DC | PRN

## 2020-01-16 MED ORDER — HYDROCHLOROTHIAZIDE 25 MG PO TABS: 25.0000 mg | ORAL_TABLET | Freq: Every day | ORAL | Status: DC

## 2020-01-16 MED ORDER — POTASSIUM GLUCONATE 550 MG PO TABS: 1.0000 | ORAL_TABLET | Freq: Every day | ORAL | Status: DC

## 2020-01-16 MED ORDER — OXYBUTYNIN CHLORIDE 5 MG PO TABS
5.0000 mg | ORAL_TABLET | Freq: Two times a day (BID) | ORAL | Status: DC
  Administered 2020-01-19: 5 mg via ORAL
  Filled 2020-01-16: qty 1

## 2020-01-16 MED ORDER — PANTOPRAZOLE SODIUM 40 MG PO TBEC: 40.0000 mg | DELAYED_RELEASE_TABLET | Freq: Every day | ORAL | Status: DC

## 2020-01-16 MED ORDER — ACETAMINOPHEN 325 MG PO TABS: 650.0000 mg | ORAL_TABLET | ORAL | Status: DC | PRN

## 2020-01-16 MED ORDER — SODIUM CHLORIDE 0.9% FLUSH
3.0000 mL | Freq: Once | INTRAVENOUS | Status: AC
  Administered 2020-01-16: 3 mL via INTRAVENOUS

## 2020-01-16 MED ORDER — ASPIRIN EC 325 MG PO TBEC: 325.0000 mg | DELAYED_RELEASE_TABLET | Freq: Every day | ORAL | Status: DC

## 2020-01-16 MED ORDER — COLESTIPOL HCL 1 G PO TABS
2.0000 g | ORAL_TABLET | Freq: Every evening | ORAL | Status: DC
  Administered 2020-01-19: 2 g via ORAL
  Filled 2020-01-16 (×3): qty 2

## 2020-01-16 MED ORDER — SIMVASTATIN 20 MG PO TABS
40.0000 mg | ORAL_TABLET | Freq: Every day | ORAL | Status: DC
  Administered 2020-01-19: 40 mg via ORAL
  Filled 2020-01-16: qty 2

## 2020-01-16 MED ORDER — EZETIMIBE-SIMVASTATIN 10-40 MG PO TABS: 1.0000 | ORAL_TABLET | Freq: Every day | ORAL | Status: DC

## 2020-01-16 MED ORDER — LACTATED RINGERS IV BOLUS
2000.0000 mL | Freq: Once | INTRAVENOUS | Status: AC
  Administered 2020-01-16: 2000 mL via INTRAVENOUS

## 2020-01-16 MED ORDER — SENNOSIDES-DOCUSATE SODIUM 8.6-50 MG PO TABS: 1.0000 | ORAL_TABLET | Freq: Every evening | ORAL | Status: DC | PRN

## 2020-01-16 MED ORDER — FUROSEMIDE 20 MG PO TABS: 20.0000 mg | ORAL_TABLET | Freq: Every day | ORAL | Status: DC

## 2020-01-16 MED ORDER — LOSARTAN POTASSIUM-HCTZ 100-25 MG PO TABS: 1.0000 | ORAL_TABLET | Freq: Every day | ORAL | Status: DC

## 2020-01-16 MED ORDER — ASPIRIN 300 MG RE SUPP
300.0000 mg | Freq: Every day | RECTAL | Status: DC
  Administered 2020-01-16 – 2020-01-19 (×4): 300 mg via RECTAL
  Filled 2020-01-16 (×4): qty 1

## 2020-01-16 MED ORDER — SODIUM CHLORIDE 0.9 % IV SOLN: INTRAVENOUS | Status: DC

## 2020-01-16 MED ORDER — ENOXAPARIN SODIUM 30 MG/0.3ML ~~LOC~~ SOLN
30.0000 mg | SUBCUTANEOUS | Status: DC
  Administered 2020-01-16 – 2020-01-19 (×4): 30 mg via SUBCUTANEOUS
  Filled 2020-01-16 (×4): qty 0.3

## 2020-01-16 MED ORDER — INSULIN ASPART 100 UNIT/ML ~~LOC~~ SOLN: 0.0000 [IU] | Freq: Three times a day (TID) | SUBCUTANEOUS | Status: DC

## 2020-01-16 MED ORDER — HYOSCYAMINE SULFATE ER 0.375 MG PO TB12: 0.3750 mg | ORAL_TABLET | Freq: Two times a day (BID) | ORAL | Status: DC | PRN

## 2020-01-16 MED ORDER — IOHEXOL 350 MG/ML SOLN
75.0000 mL | Freq: Once | INTRAVENOUS | Status: AC | PRN
  Administered 2020-01-16: 75 mL via INTRAVENOUS

## 2020-01-16 MED ORDER — ACETAMINOPHEN 325 MG PO TABS
650.0000 mg | ORAL_TABLET | Freq: Two times a day (BID) | ORAL | Status: DC
  Administered 2020-01-19: 650 mg via ORAL
  Filled 2020-01-16 (×3): qty 2

## 2020-01-16 MED ORDER — MELOXICAM 7.5 MG PO TABS: 7.5000 mg | ORAL_TABLET | Freq: Every day | ORAL | Status: DC

## 2020-01-16 MED ORDER — ASPIRIN EC 81 MG PO TBEC: 81.0000 mg | DELAYED_RELEASE_TABLET | Freq: Every day | ORAL | Status: DC

## 2020-01-16 MED ORDER — CLOPIDOGREL BISULFATE 75 MG PO TABS
75.0000 mg | ORAL_TABLET | Freq: Every evening | ORAL | Status: DC
  Administered 2020-01-19: 75 mg via ORAL
  Filled 2020-01-16: qty 1

## 2020-01-16 MED ORDER — LOSARTAN POTASSIUM 50 MG PO TABS: 100.0000 mg | ORAL_TABLET | Freq: Every day | ORAL | Status: DC

## 2020-01-16 MED ORDER — ACETAMINOPHEN 160 MG/5ML PO SOLN: 650.0000 mg | ORAL | Status: DC | PRN

## 2020-01-16 MED ORDER — STROKE: EARLY STAGES OF RECOVERY BOOK
Freq: Once | Status: DC
  Filled 2020-01-16: qty 1

## 2020-01-16 MED ORDER — EZETIMIBE 10 MG PO TABS
10.0000 mg | ORAL_TABLET | Freq: Every day | ORAL | Status: DC
  Administered 2020-01-19: 10 mg via ORAL
  Filled 2020-01-16 (×2): qty 1

## 2020-01-16 NOTE — H&P (Signed)
History and Physical    Sydney Schultz FIE:[id number] DOB: Jun 16, 1931 DOA: 01/16/2020  PCP: Jaclyn Shaggy, MD (Confirm with patient/family/NH records and if not entered, this has to be entered at [facility] point of entry) Patient coming from: home  I have personally briefly reviewed patient's old medical records in [facility]  Chief Complaint: facial droop, slurred speech, weakness  HPI: Sydney Schultz is a 86 y.o. female with medical history significant of DM, HTN, OSA and OA. She has been followed by neurology at [facility] with last OV 02/17/19 for f/u of evaluation for change in consciousness episode. Evaluation was non-focal. MRI at that time revealed cerebral atrophy and 2 old lacuanr infarcts right cerebral hemisphere. Patient has had progressive decline and prior to the precipitating event required assistance with dressing, toileting and mobility. On 01/15/20 she had increased weakness and around 5 PM was noted to hve developed a left facial droop and slurred speech. EMS was called and patient was brought to [facility] for further evaluation.    ED Course: Code stroke called on arrival with rapid assessment by EDP Wickline and Dr. Jobe Marker for neuro. She was noted to have facial droop and slurred speech. Initial imaging was negative for any major vessel occlusion except for 50% stenosis left ICA. She was not a candidate for acute intervention. Lab studies revealed stable renal function, elevated serum glucose at 244 to 221 with AG 17. TRH called to admit patient for completion of stroke workup.  Review of Systems: As per HPI otherwise 10 point review of systems negative. Caveat - patient with dysarthria cannot give a good history   Past Medical History:  Diagnosis Date  . Arthritis   . Diabetes (HCC)   . Dyspnea    with exertion  . GERD (gastroesophageal reflux disease)   . Hypertension   . Incontinence   . Sleep apnea    uses cpap  . Swelling of both lower extremities   .  Wheezing     Past Surgical History:  Procedure Laterality Date  . ANKLE SURGERY    . CARDIAC CATHETERIZATION     coronary stent  . CARPAL TUNNEL RELEASE Right   . CATARACT EXTRACTION W/PHACO Left 07/29/2017   Procedure: CATARACT EXTRACTION PHACO AND INTRAOCULAR LENS PLACEMENT (IOC);  Surgeon: Galen Manila, MD;  Location: [facility] ORS;  Service: Ophthalmology;  Laterality: Left;  [place] 00:38.9 AP% 12.0 CDE 4.68 Fluid Pack Lot # [id number]  . CATARACT EXTRACTION W/PHACO Right 08/19/2017   Procedure: CATARACT EXTRACTION PHACO AND INTRAOCULAR LENS PLACEMENT (IOC);  Surgeon: Galen Manila, MD;  Location: [facility] ORS;  Service: Ophthalmology;  Laterality: Right;  [place] 00:35 AP% 16.0 CDE 5.69 Fluid pack lot # [id number]  . CESAREAN SECTION     x 2  . CORONARY ANGIOPLASTY     STENT  . FOOT SURGERY Left    ankle tendon surgery  . JOINT REPLACEMENT Bilateral    TKR/ RIGHT SHOULDER  . KNEE SURGERY Bilateral    total knee replacements  . SHOULDER SURGERY Right    shoulder replacement  . tumor removed     neck   Soc Hx -  Married a long time. She has two daughters. She lives with her husband and younger daughter - who is the primary care-giver and assists her mother with ADLs.   reports that she has never smoked. She has never used smokeless tobacco. She reports that she does not drink alcohol and does not use drugs.  No Known  Allergies  History reviewed. No pertinent family history.   Prior to Admission medications   Medication Sig Start Date End Date Taking? Authorizing Provider  acetaminophen (TYLENOL) 650 MG CR tablet Take 650 mg by mouth 2 (two) times daily.    [provider]  aspirin EC 81 MG tablet Take 81 mg by mouth daily.    [provider]  Biotin 1000 MCG tablet Take 1,000 mcg by mouth daily.    [provider]  calcium-vitamin D (OSCAL WITH D) 500-200 MG-UNIT tablet Take 1 tablet by mouth daily with breakfast.    [provider]  clopidogrel  (PLAVIX) 75 MG tablet Take 75 mg by mouth every evening.     [provider]  colestipol (COLESTID) 1 g tablet Take 2 g by mouth daily.    [provider]  ferrous gluconate ([employer] IRON) 240 (27 FE) MG tablet Take 240 mg by mouth daily.    [provider]  ferrous sulfate 325 (65 FE) MG tablet Take 325 mg by mouth daily with breakfast.    [provider]  furosemide (LASIX) 40 MG tablet Take 20 mg by mouth daily.     [provider]  hyoscyamine (LEVBID) 0.375 MG 12 hr tablet Take 0.375 mg by mouth every 12 (twelve) hours as needed for cramping.    [provider]  losartan-hydrochlorothiazide (HYZAAR) 100-25 MG per tablet Take 1 tablet by mouth daily.  04/05/13   [provider]  meloxicam (MOBIC) 7.5 MG tablet Take 7.5 mg by mouth daily.  04/05/13   [provider]  metFORMIN (GLUCOPHAGE) 500 MG tablet Take 500,250 mg by mouth 2 (two) times daily with a meal. 500mg  in the morning and 250mg  at night    [provider]  oxybutynin (DITROPAN) 5 MG tablet Take 5 mg by mouth 2 (two) times daily.    [provider]  pantoprazole (PROTONIX) 40 MG tablet Take 40 mg by mouth daily.    [provider]  Potassium Gluconate 550 MG TABS Take 1 tablet by mouth daily. 595 mg    [provider]  vitamin E 400 UNIT capsule Take 400 Units by mouth daily.    [provider]  VYTORIN 10-40 MG per tablet Take 1 tablet by mouth daily at 6 PM.  04/05/13   [provider]    Physical Exam: Vitals:   01/16/20 0345 01/16/20 0350 01/16/20 0354 01/16/20 0400  BP:      Pulse: 85 77 80 81  Resp: 14 18 19  (!) 23  SpO2: 100% 100% 100% 100%  Weight:      Height:         Vitals:   01/16/20 0345 01/16/20 0350 01/16/20 0354 01/16/20 0400  BP:      Pulse: 85 77 80 81  Resp: 14 18 19  (!) 23  SpO2: 100% 100% 100% 100%  Weight:      Height:       General: - elderly woman, appearing very frail  with temporal wasting  looking older than chronologic age in no distress Eyes: PERRL, arcus senilis bilaterally, right  Lid lag noted and conjunctivae normal ENMT: Mucous membranes are moist. Posterior pharynx clear of any exudate or lesions Neck: normal, supple, no masses, no thyromegaly Respiratory: clear to auscultation bilaterally, no wheezing, no crackles. Normal respiratory effort. No accessory muscle use.  Cardiovascular: Regular rate and rhythm, no murmurs / rubs / gallops. No extremity edema. Feet are cold to touch.  Absent to trace  pulses. No carotid bruits.  Abdomen: no tenderness, no masses palpated. No hepatosplenomegaly. Bowel sounds positive.  Musculoskeletal: chronic arthritic changes both hands w/o acute inflammation. Scars bilaterally from knee surgery. Left ankle deformed after surgical intervention. Hammer toe deformities noted.  Skin: no rashes, lesions, ulcers. No induration Neurologic: CN 2-12 lid lag OD, left facial droop and poor movement. Visual fields not checked -deferred to neurology. MS UE appears normal for age. Patient does not lift legs or keep elevated when passively moved. DTRs at patellar tendons trace, absent achilles. Psychiatric: Could not assess cognition.  Awake and oriented  To person.     Labs on Admission: I have personally reviewed following labs and imaging studies  CBC: Recent Labs  Lab 01/16/20 0220 01/16/20 0225  WBC 9.6  --   NEUTROABS 7.8*  --   HGB 13.8 15.6*  HCT 45.4 46.0  MCV 97.2  --   PLT 257  --    Basic Metabolic Panel: Recent Labs  Lab 01/16/20 0220 01/16/20 0225  NA 141 143  K 3.5 3.4*  CL 107 108  CO2 17*  --   GLUCOSE 244* 221*  BUN 16 18  CREATININE 1.50* 1.30*  CALCIUM 10.8*  --    GFR: Estimated Creatinine Clearance: 25 mL/min (A) (by C-G formula based on SCr of 1.3 mg/dL (H)). Liver Function Tests: Recent Labs  Lab 01/16/20 0220  AST 33  ALT 19  ALKPHOS 101  BILITOT 0.7  PROT 7.1  ALBUMIN 4.3    No results for input(s): LIPASE, AMYLASE in the last 168 hours. No results for input(s): AMMONIA in the last 168 hours. Coagulation Profile: Recent Labs  Lab 01/16/20 0220  INR 1.1   Cardiac Enzymes: No results for input(s): CKTOTAL, CKMB, CKMBINDEX, TROPONINI in the last 168 hours. BNP (last 3 results) No results for input(s): PROBNP in the last 8760 hours. HbA1C: No results for input(s): HGBA1C in the last 72 hours. CBG: Recent Labs  Lab 01/16/20 0221  GLUCAP 187*   Lipid Profile: No results for input(s): CHOL, HDL, LDLCALC, TRIG, CHOLHDL, LDLDIRECT in the last 72 hours. Thyroid Function Tests: No results for input(s): TSH, T4TOTAL, FREET4, T3FREE, THYROIDAB in the last 72 hours. Anemia Panel: No results for input(s): VITAMINB12, FOLATE, FERRITIN, TIBC, IRON, RETICCTPCT in the last 72 hours. Urine analysis:    Component Value Date/Time   COLORURINE YELLOW (A) 10/18/2018 1441   APPEARANCEUR CLEAR (A) 10/18/2018 1441   APPEARANCEUR Clear 02/04/2014 2021   LABSPEC 1.012 10/18/2018 1441   LABSPEC 1.005 02/04/2014 2021   PHURINE 5.0 10/18/2018 1441   GLUCOSEU >=500 (A) 10/18/2018 1441   GLUCOSEU Negative 02/04/2014 2021   HGBUR NEGATIVE 10/18/2018 1441   BILIRUBINUR NEGATIVE 10/18/2018 1441   BILIRUBINUR Negative 02/04/2014 2021   KETONESUR NEGATIVE 10/18/2018 1441   PROTEINUR NEGATIVE 10/18/2018 1441   NITRITE NEGATIVE 10/18/2018 1441   LEUKOCYTESUR NEGATIVE 10/18/2018 1441   LEUKOCYTESUR Negative 02/04/2014 2021    Radiological Exams on Admission: CT Code Stroke CTA Head W/WO contrast  Result Date: 01/16/2020 CLINICAL DATA:  Facial droop EXAM: CT ANGIOGRAPHY HEAD AND NECK TECHNIQUE: Multidetector CT imaging of the head and neck was performed using the standard protocol during bolus administration of intravenous contrast. Multiplanar CT image reconstructions and MIPs were obtained to evaluate the vascular anatomy. Carotid stenosis measurements (when applicable) are  obtained utilizing NASCET criteria, using the distal internal carotid diameter as the denominator. CONTRAST:  75mL OMNIPAQUE IOHEXOL 350 MG/ML SOLN COMPARISON:  None. FINDINGS: CTA NECK FINDINGS SKELETON: There is no bony spinal canal stenosis. No lytic or blastic lesion. OTHER NECK: Normal pharynx, larynx and major salivary glands. No cervical lymphadenopathy. Unremarkable thyroid gland. UPPER CHEST: No pneumothorax or pleural effusion. No nodules or masses. AORTIC ARCH: There is mild calcific atherosclerosis of the aortic arch. There is no aneurysm, dissection or hemodynamically significant stenosis of the visualized portion of the aorta. Conventional 3 vessel aortic branching pattern. The visualized proximal subclavian arteries are widely patent. RIGHT CAROTID SYSTEM: No dissection, occlusion or aneurysm. Mild atherosclerotic calcification at the carotid bifurcation without hemodynamically significant stenosis. LEFT CAROTID SYSTEM: No dissection, occlusion or aneurysm. There is mixed density atherosclerosis extending into the proximal ICA, resulting in 50% stenosis. VERTEBRAL ARTERIES: Left dominant configuration. Both origins are clearly patent. There is no dissection, occlusion or flow-limiting stenosis to the skull base (V1-V3 segments). CTA HEAD FINDINGS POSTERIOR CIRCULATION: --Vertebral arteries: Normal V4 segments. --Inferior cerebellar arteries: Normal. --Basilar artery: Normal. --Superior cerebellar arteries: Normal. --Posterior cerebral arteries (PCA): Normal. There are bilateral posterior communicating arteries (p-comm) that partially supply the PCAs. ANTERIOR CIRCULATION: --Intracranial internal carotid arteries: Atherosclerotic calcification of the internal carotid arteries at the skull base without hemodynamically significant stenosis. --Anterior cerebral arteries (ACA): Normal. Both A1 segments are present. Patent anterior communicating artery (a-comm). --Middle cerebral arteries (MCA): Normal.  VENOUS SINUSES: As permitted by contrast timing, patent. ANATOMIC VARIANTS: None Review of the MIP images confirms the above findings. IMPRESSION: 1. No intracranial arterial occlusion or high-grade stenosis. 2. Approximately 50% stenosis of the proximal left internal carotid artery secondary to mixed density atherosclerosis. Aortic Atherosclerosis (ICD10-I70.0). Electronically Signed   By: Deatra Robinson M.D.   On: 01/16/2020 03:08   CT Code Stroke CTA Neck W/WO contrast  Result Date: 01/16/2020 CLINICAL DATA:  Facial droop EXAM: CT ANGIOGRAPHY HEAD AND NECK TECHNIQUE: Multidetector CT imaging of the head and neck was performed using the standard protocol during bolus administration of intravenous contrast. Multiplanar CT image reconstructions and MIPs were obtained to evaluate the vascular anatomy. Carotid stenosis measurements (when applicable) are obtained utilizing NASCET criteria, using the distal internal carotid diameter as the denominator. CONTRAST:  82mL OMNIPAQUE IOHEXOL 350 MG/ML SOLN COMPARISON:  None. FINDINGS: CTA NECK FINDINGS SKELETON: There is no bony spinal canal stenosis. No lytic or blastic lesion. OTHER NECK: Normal pharynx, larynx and major salivary glands. No cervical lymphadenopathy. Unremarkable thyroid gland. UPPER CHEST: No pneumothorax or pleural effusion. No nodules or masses. AORTIC ARCH: There is mild calcific atherosclerosis of the aortic arch. There is no aneurysm, dissection or hemodynamically significant stenosis of the visualized portion of the aorta. Conventional 3 vessel aortic branching pattern. The visualized proximal subclavian arteries are widely patent. RIGHT CAROTID SYSTEM: No dissection, occlusion or aneurysm. Mild atherosclerotic calcification at the carotid bifurcation without hemodynamically significant stenosis. LEFT CAROTID SYSTEM: No dissection, occlusion or aneurysm. There is mixed density atherosclerosis extending into the proximal ICA, resulting in 50%  stenosis. VERTEBRAL ARTERIES: Left dominant configuration. Both origins are clearly patent. There is no dissection, occlusion or flow-limiting stenosis to the skull base (V1-V3 segments). CTA HEAD FINDINGS POSTERIOR CIRCULATION: --Vertebral arteries: Normal V4 segments. --Inferior cerebellar arteries: Normal. --Basilar artery: Normal. --Superior cerebellar arteries: Normal. --Posterior cerebral arteries (PCA): Normal. There are bilateral posterior communicating arteries (p-comm) that partially supply the PCAs. ANTERIOR CIRCULATION: --Intracranial internal carotid arteries: Atherosclerotic calcification of the internal carotid arteries at the skull base without hemodynamically significant stenosis. --Anterior cerebral arteries (ACA): Normal. Both A1 segments are present. Patent  anterior communicating artery (a-comm). --Middle cerebral arteries (MCA): Normal. VENOUS SINUSES: As permitted by contrast timing, patent. ANATOMIC VARIANTS: None Review of the MIP images confirms the above findings. IMPRESSION: 1. No intracranial arterial occlusion or high-grade stenosis. 2. Approximately 50% stenosis of the proximal left internal carotid artery secondary to mixed density atherosclerosis. Aortic Atherosclerosis (ICD10-I70.0). Electronically Signed   By: Deatra Robinson M.D.   On: 01/16/2020 03:08   CT HEAD CODE STROKE WO CONTRAST  Result Date: 01/16/2020 CLINICAL DATA:  Code stroke.  Facial droop EXAM: CT HEAD WITHOUT CONTRAST TECHNIQUE: Contiguous axial images were obtained from the base of the skull through the vertex without intravenous contrast. COMPARISON:  10/18/2018 FINDINGS: Brain: There is no mass, hemorrhage or extra-axial collection. There is generalized atrophy without lobar predilection. There is hypoattenuation of the periventricular white matter, most commonly indicating chronic ischemic microangiopathy. Vascular: Atherosclerotic calcification of the internal carotid arteries at the skull base. No abnormal  hyperdensity of the major intracranial arteries or dural venous sinuses. Skull: The visualized skull base, calvarium and extracranial soft tissues are normal. Sinuses/Orbits: No fluid levels or advanced mucosal thickening of the visualized paranasal sinuses. No mastoid or middle ear effusion. The orbits are normal. ASPECTS [facility] Stroke Program Early CT Score) - Ganglionic level infarction (caudate, lentiform nuclei, internal capsule, insula, M1-M3 cortex): 7 - Supraganglionic infarction (M4-M6 cortex): 3 Total score (0-10 with 10 being normal): 10 IMPRESSION: 1. No acute intracranial abnormality. 2. ASPECTS is 10. These results were communicated to Dr. Ritta Slot at 2:35 am on 01/16/2020 by text page via the [facility] messaging system. Electronically Signed   By: Deatra Robinson M.D.   On: 01/16/2020 02:35    EKG: Independently reviewed. Sinus rhythm, LVH, no acute changes  Assessment/Plan Active Problems:   CVA (cerebral vascular accident) (HCC)   DM (diabetes mellitus) (HCC)   HTN (hypertension)   GERD (gastroesophageal reflux disease)   OSA (obstructive sleep apnea)  (please populate well all problems here in Problem List. (For example, if patient is on BP meds at home and you resume or decide to hold them, it is a problem that needs to be her. Same for CAD, COPD, HLD and so on)   1. Neuro - patient with h/o episode of  Staring. Fully evaluated by Neurology at [facility] in October 2020. MRI at that time with old lacunar infarcts right cerebellar hemisphere. At baseline she requires assistance with ADLs and has limitations in mobility. Now with acute onset of left facial droop and slurred speech. Hard to assess for any focal weakness. CTA w/o occlusive disease. Plan Telemetry admit   MRI for new changes  2D echo  - last study 01/29/18 with EF 55%  PT/OT/SLP evaluation  TOC re home needs  Continue ASA and Plavix: may need to step up anticoagulation once studies done.  2. DM- elevated  blood sugar with small AG. Patient was on metformin Plan Hold metformin in acute setting  A1C ordered  Sliding scale coverage  Gentle hydration NS @ 50cc/hr x 12 hrs  Recheck Bmet and AG in AM 9/6.  3. HTN- will continue home meds. Will be permissive re: BP control  4. GERD - continue present meds  5. Sleep apnea - no report of using CPAP - will recheck with family.  6. Code status - discussed with both daughters. They do not want to prolong suffering in the event of cardiac arrest or respiratory failure- hence DNR  DVT prophylaxis: lovenox low dose  Code  Status: DNR  Family Communication: spoke with both daughters Vernona Rieger and her sister. Spoke with husband Mr. Hlavaty who prefers we communicate with Vernona Rieger Disposition Plan: TBD - TOC consult  Consults called: neurology - Dr. Amada Jupiter  Admission status: inpatient - medical telemetry (inpatient / obs / tele / medical floor / SDU)   Illene Regulus MD [facility] Pager [phone]  If 7PM-7AM, please contact night-coverage [email or web address] Password [email or web address]  01/16/2020, 4:10 AM

## 2020-01-16 NOTE — Progress Notes (Signed)
TR[facility] Progress note  I have evaluated the patient and reviewed the chart. I have discussed the plan with the daughter who is in the room.   Active Problems:   CVA (cerebral vascular accident)  - acute onset left hemianopia and left facial weakness as well as confusion. - MRI> Acute right PCA territory infarct, predominantly involving the temporal and occipital cortex. Small acute infarct of the left thalamus. - she has failed the swallow screen- awaiting SLP eval - f/u on ECHO  - f/u on neurology recommendations    DM (diabetes mellitus)  - cont SSI    HTN (hypertension) - hold all antihypertensives in setting of acute CVA    GERD (gastroesophageal reflux disease) - cont PPI    OSA (obstructive sleep apnea) - CPAP QHS  Calvert Cantor, MD

## 2020-01-16 NOTE — ED Notes (Signed)
Dr. Ranell Patrick notified that pt failed swallow screen

## 2020-01-16 NOTE — Progress Notes (Signed)
STROKE TEAM PROGRESS NOTE   INTERVAL HISTORY Her daughter is at the bedside.  Pt lying in bed, not in distress. However, per RN and daughter pt was agitated most of the time, trying to rip off IV or monitoring leads and restless. Pt still has left hemianopia and left facial droop. Moving all extremities appears to be symmetrical. MRI showed lower pontine, right PCA and left thalamic infarcts, embolic pattern.    OBJECTIVE Vitals:   01/16/20 0715 01/16/20 0730 01/16/20 0745 01/16/20 0800  BP: (!) 148/68 (!) 141/61 134/62 (!) 143/63  Pulse:  74 79 75  Resp: (!) 31 16 17  (!) 22  Temp:      TempSrc:      SpO2:  100% 100% 100%  Weight:      Height:        CBC:  Recent Labs  Lab 01/16/20 0220 01/16/20 0225  WBC 9.6  --   NEUTROABS 7.8*  --   HGB 13.8 15.6*  HCT 45.4 46.0  MCV 97.2  --   PLT 257  --     Basic Metabolic Panel:  Recent Labs  Lab 01/16/20 0220 01/16/20 0220 01/16/20 0225 01/16/20 0507  NA 141   < > 143 143  K 3.5   < > 3.4* 3.7  CL 107   < > 108 107  CO2 17*  --   --  20*  GLUCOSE 244*   < > 221* 141*  BUN 16   < > 18 16  CREATININE 1.50*   < > 1.30* 1.30*  CALCIUM 10.8*  --   --  10.8*   < > = values in this interval not displayed.    Lipid Panel:     Component Value Date/Time   CHOL 209 (H) 01/16/2020 0500   TRIG 68 01/16/2020 0500   HDL 64 01/16/2020 0500   CHOLHDL 3.3 01/16/2020 0500   VLDL 14 01/16/2020 0500   LDLCALC 131 (H) 01/16/2020 0500   HgbA1c:  Lab Results  Component Value Date   HGBA1C 5.6 01/16/2020   Urine Drug Screen: No results found for: LABOPIA, COCAINSCRNUR, LABBENZ, AMPHETMU, THCU, LABBARB  Alcohol Level No results found for: ETH  IMAGING  CT Code Stroke CTA Head W/WO contrast CT Code Stroke CTA Neck W/WO contrast 01/16/2020   ADDENDUM: Dr. 03/17/2020 called for clarification of an arterial stenosis reference in a subsequent brain MRI report. The patient's right PCA territory infarct is associated with a severe P2 segment  stenosis with flow gap. There is also a flow reducing left P1 segment stenosis which is ameliorated by a posterior communicating artery.  IMPRESSION:  1. No intracranial arterial occlusion or high-grade stenosis.  2. Approximately 50% stenosis of the proximal left internal carotid artery secondary to mixed density atherosclerosis.  Aortic Atherosclerosis (ICD10-I70.0).   MR BRAIN WO CONTRAST 01/16/2020 MPRESSION:  1. Acute right PCA territory infarct, predominantly involving the temporal and occipital cortex.  2. Small acute infarct of the left thalamus.  3. Short segment occlusion of the right P2 segment with distal reconstitution demonstrated on earlier CTA.  4. Severe chronic ischemic microangiopathy.   CT HEAD CODE STROKE WO CONTRAST 01/16/2020 MPRESSION:  1. No acute intracranial abnormality.  2. ASPECTS is 10.   DG Chest 2 View 01/16/2020 IMPRESSION:  No evidence of acute cardiopulmonary disease.   Transthoracic Echocardiogram  Pending  Bilateral Lower Extremity Venous Dopplers - pending  ECG - SR rate 83 BPM. (See cardiology reading for complete details)  PHYSICAL EXAM  Temp:  [97.6 F (36.4 C)-99.3 F (37.4 C)] 99.3 F (37.4 C) (09/05 1553) Pulse Rate:  [38-98] 81 (09/05 1700) Resp:  [14-31] 18 (09/05 1700) BP: (125-207)/(56-92) 157/67 (09/05 1700) SpO2:  [90 %-100 %] 100 % (09/05 1700) Weight:  [53.8 kg] 53.8 kg (09/05 0308)  General - Well nourished, well developed, in no apparent distress, lethargy.  Ophthalmologic - fundi not visualized due to noncooperation.  Cardiovascular - Regular rhythm and rate.  Neuro - lethargic, arousable, eyes open, orientated to self and people but not to time or place.  Moderate dysarthria, paucity of speech, able to speak short sentences spontaneously, however, not cooperative with naming or repetition.  Bilateral gaze nystagmus, right gaze more prominent than left.  Nystagmus direction bilateral.  PERRL.  Left hemianopia.  Left  complete facial palsy with decreased left frontal ring toes, left watery eye, decreased left eye blinking, significance and left facial droop, however left palpebral fissure similar to the right.  Tongue midline.  Bilateral upper extremity 4/5 spontaneously, bilateral lower extremity 3 -/5 with pain summation.  Sensation and coordination not corporative.  Gait not tested.   ASSESSMENT/PLAN Sydney Schultz is a 86 y.o. female with history of  "episodes" which previously been attributed to diabetes, TIAs, "sleep attacks"DM, HTN, CAD (s/p stent) and OSA, presents with confusion and shaking. She did not receive IV t-PA due to late presentation (>4.5 hours from time of onset).  Stroke: acute right PCA, right lower pontine, left thalamic infarcts - embolic pattern, source unclear.  Code Stroke CT Head -  No acute intracranial abnormality. ASPECTS is 10.     CTA H&N - severe P2 segment stenosis with flow gap. There is also a flow reducing left P1 segment stenosis which is ameliorated by a posterior communicating artery.  MRI head - Acute right PCA territory infarct, predominantly involving the temporal and occipital cortex. Small acute infarct of the left thalamus. Also left lower pontine infarct.    LE Venous Dopplers - pending  2D Echo - pending  Recommend 30-day cardiac event monitoring as outpatient to rule out A. fib  Sars Corona Virus 2 - negative  LDL - 131  HgbA1c - 5.6  VTE prophylaxis - Lovenox  clopidogrel 75 mg daily prior to admission, now on aspirin 300 PR due to n.p.o.  Patient will be counseled to be compliant with her antithrombotic medications  Ongoing aggressive stroke risk factor management  Therapy recommendations:  pending  Disposition:  Pending  Episodes of altered consciousness  Per daughter, she has been having the unresponsive episodes for some time, lasting hours and resolved. She called it "diabetic episodes", but did not check glucose during the  episodes.   Follows with Dr. Malvin Johns and NP Morrie Sheldon at [facility]  Etiology unclear, DDx including vertebrobasilar insufficiency, Lewy body dementia, seizure, syncope, hypoglycemia episode  Continue follow up with Dr. Malvin Johns and NP Morrie Sheldon  Hypertension  Home BP meds: irbesartan ; HCTZ  Current BP meds: none  Stable   gradually normalize in 2-3 days  . Long-term BP goal normotensive  Hyperlipidemia  Home Lipid lowering medication: none (Vytorin in the past)  LDL 131, goal < 70  Current lipid lowering medication: no statin yet due to NPO status  Continue statin at discharge  Diabetes  Home diabetic meds: Jardiance  Current diabetic meds: SSI   HgbA1c 5.6, goal < 7.0  CBG monitoring  PCP follow up  Other Stroke Risk Factors  Advanced age  Coronary artery disease  Obstructive sleep apnea, on CPAP at home  Other Active Problems  Code status - DNR  Dementia - Aricept - resume once po access CKD - stage  3b - creatinine - 1.50->1.30  Hospital day # 0  Marvel Plan, MD PhD Stroke Neurology 01/16/2020 5:43 PM  I spent  35 minutes in total face-to-face time with the patient, more than 50% of which was spent in counseling and coordination of care, reviewing test results, images and medication, and discussing the diagnosis, treatment plan and potential prognosis. This patient's care requiresreview of multiple databases, neurological assessment, discussion with family, other specialists and medical decision making of high complexity. I had long discussion with daughter at bedside, updated pt current condition, treatment plan and potential prognosis, and answered all the questions. She expressed understanding and appreciation.     To contact Stroke Continuity provider, please refer to [email or web address]. After hours, contact General Neurology

## 2020-01-16 NOTE — ED Provider Notes (Signed)
[facility] EMERGENCY DEPARTMENT Provider Note   CSN: [id number] Arrival date & time: 01/16/20  0219     History Chief Complaint - weakness/code stroke  Level 5 caveat due to acuity of condition Sydney Schultz is a 86 y.o. female.  The history is provided by the patient and the EMS personnel.  Weakness Severity:  Severe Onset quality:  Sudden Timing:  Constant Progression:  Worsening Chronicity:  New Relieved by:  None tried Worsened by:  Nothing Patient history of diabetes, hypertension presents with concerns for stroke.  It is reported the patient was confused and had a left-sided facial droop.  Last known well approximately 1700 on September 4  no other details known on arrival     Past Medical History:  Diagnosis Date  . Arthritis   . Diabetes (HCC)   . Dyspnea    with exertion  . GERD (gastroesophageal reflux disease)   . Hypertension   . Incontinence   . Sleep apnea    uses cpap  . Swelling of both lower extremities   . Wheezing     There are no problems to display for this patient.   Past Surgical History:  Procedure Laterality Date  . ANKLE SURGERY    . CARDIAC CATHETERIZATION     coronary stent  . CARPAL TUNNEL RELEASE Right   . CATARACT EXTRACTION W/PHACO Left 07/29/2017   Procedure: CATARACT EXTRACTION PHACO AND INTRAOCULAR LENS PLACEMENT (IOC);  Surgeon: Galen Manila, MD;  Location: [facility] ORS;  Service: Ophthalmology;  Laterality: Left;  [place] 00:38.9 AP% 12.0 CDE 4.68 Fluid Pack Lot # [id number]  . CATARACT EXTRACTION W/PHACO Right 08/19/2017   Procedure: CATARACT EXTRACTION PHACO AND INTRAOCULAR LENS PLACEMENT (IOC);  Surgeon: Galen Manila, MD;  Location: [facility] ORS;  Service: Ophthalmology;  Laterality: Right;  [place] 00:35 AP% 16.0 CDE 5.69 Fluid pack lot # [id number]  . CESAREAN SECTION     x 2  . CORONARY ANGIOPLASTY     STENT  . FOOT SURGERY Left    ankle tendon surgery  . JOINT REPLACEMENT Bilateral    TKR/ RIGHT  SHOULDER  . KNEE SURGERY Bilateral    total knee replacements  . SHOULDER SURGERY Right    shoulder replacement  . tumor removed     neck     OB History   No obstetric history on file.     No family history on file.  Social History   Tobacco Use  . Smoking status: Never Smoker  . Smokeless tobacco: Never Used  Vaping Use  . Vaping Use: Never used  Substance Use Topics  . Alcohol use: No  . Drug use: No    Home Medications Prior to Admission medications   Medication Sig Start Date End Date Taking? Authorizing Provider  acetaminophen (TYLENOL) 650 MG CR tablet Take 650 mg by mouth 2 (two) times daily.    [provider]  aspirin EC 81 MG tablet Take 81 mg by mouth daily.    [provider]  Biotin 1000 MCG tablet Take 1,000 mcg by mouth daily.    [provider]  calcium-vitamin D (OSCAL WITH D) 500-200 MG-UNIT tablet Take 1 tablet by mouth daily with breakfast.    [provider]  clopidogrel (PLAVIX) 75 MG tablet Take 75 mg by mouth every evening.     [provider]  colestipol (COLESTID) 1 g tablet Take 2 g by mouth daily.    [provider]  ferrous gluconate ([employer]  IRON) 240 (27 FE) MG tablet Take 240 mg by mouth daily.    [provider]  ferrous sulfate 325 (65 FE) MG tablet Take 325 mg by mouth daily with breakfast.    [provider]  furosemide (LASIX) 40 MG tablet Take 20 mg by mouth daily.     [provider]  hyoscyamine (LEVBID) 0.375 MG 12 hr tablet Take 0.375 mg by mouth every 12 (twelve) hours as needed for cramping.    [provider]  losartan-hydrochlorothiazide (HYZAAR) 100-25 MG per tablet Take 1 tablet by mouth daily.  04/05/13   [provider]  meloxicam (MOBIC) 7.5 MG tablet Take 7.5 mg by mouth daily.  04/05/13   [provider]  metFORMIN (GLUCOPHAGE) 500 MG tablet Take 500,250 mg by mouth 2 (two) times daily with a meal.  in the morning  and  at night    [provider]  oxybutynin (DITROPAN) 5 MG tablet Take 5 mg by mouth 2 (two) times daily.    [provider]  pantoprazole (PROTONIX) 40 MG tablet Take 40 mg by mouth daily.    [provider]  Potassium Gluconate 550 MG TABS Take 1 tablet by mouth daily. 595 mg    [provider]  vitamin E 400 UNIT capsule Take 400 Units by mouth daily.    [provider]  VYTORIN 10-40 MG per tablet Take 1 tablet by mouth daily at 6 PM.  04/05/13   [provider]    Allergies    Patient has no known allergies.  Review of Systems   Review of Systems  Unable to perform ROS: Acuity of condition  Neurological: Positive for weakness.    Physical Exam Updated Vital Signs BP (!) 152/92   Pulse (!) 58   Resp (!) 21   Ht 1.575 m ( )   Wt 53.8 kg   SpO2 99%   BMI 21.69 kg/m   Physical Exam CONSTITUTIONAL: Elderly and frail HEAD: Normocephalic/atraumatic EYES: EOMI/PERRL, pupils pinpoint, unable to close left eyelid ENMT: Mucous membranes moist NECK: supple no meningeal signs CV: S1/S2 noted LUNGS: Lungs are clear to auscultation bilaterally, no apparent distress ABDOMEN: soft, nontender NEURO: Pt is awake/alert she is confused.  Obvious left facial droop is noted.  No obvious extremity drift is noted.  Left visual field deficit per neurology EXTREMITIES: pulses normal/equal, full ROM, no deformities, no tenderness with range of motion of each hip SKIN: warm, color normal PSYCH: Unable to assess ED Results / Procedures / Treatments   Labs (all labs ordered are listed, but only abnormal results are displayed) Labs Reviewed  DIFFERENTIAL - Abnormal; Notable for the following components:      Result Value   Neutro Abs 7.8 (*)    All other components within normal limits  COMPREHENSIVE METABOLIC PANEL - Abnormal; Notable for the following components:   CO2 17 (*)    Glucose, Bld 244 (*)    Creatinine, Ser 1.50  (*)    Calcium 10.8 (*)    GFR calc non Af Amer 31 (*)    GFR calc Af Amer 36 (*)    Anion gap 17 (*)    All other components within normal limits  I-STAT CHEM 8, ED - Abnormal; Notable for the following components:   Potassium 3.4 (*)    Creatinine, Ser 1.30 (*)    Glucose, Bld 221 (*)    TCO2 18 (*)    Hemoglobin 15.6 (*)  All other components within normal limits  CBG MONITORING, ED - Abnormal; Notable for the following components:   Glucose-Capillary 187 (*)    All other components within normal limits  PROTIME-INR  APTT  CBC    EKG EKG Interpretation  Date/Time:  Sunday January 16 2020 02:58:43 EDT Ventricular Rate:  83 PR Interval:    QRS Duration: 76 QT Interval:  375 QTC Calculation: 441 R Axis:   73 Text Interpretation: Sinus rhythm Probable LVH with secondary repol abnrm Baseline wander in lead(s) V5 Interpretation limited secondary to artifact Confirmed by Zadie Rhine ([id number]) on 01/16/2020 3:03:48 AM   Radiology CT Code Stroke CTA Head W/WO contrast  Result Date: 01/16/2020 CLINICAL DATA:  Facial droop EXAM: CT ANGIOGRAPHY HEAD AND NECK TECHNIQUE: Multidetector CT imaging of the head and neck was performed using the standard protocol during bolus administration of intravenous contrast. Multiplanar CT image reconstructions and MIPs were obtained to evaluate the vascular anatomy. Carotid stenosis measurements (when applicable) are obtained utilizing NASCET criteria, using the distal internal carotid diameter as the denominator. CONTRAST:  40mL OMNIPAQUE IOHEXOL 350 MG/ML SOLN COMPARISON:  None. FINDINGS: CTA NECK FINDINGS SKELETON: There is no bony spinal canal stenosis. No lytic or blastic lesion. OTHER NECK: Normal pharynx, larynx and major salivary glands. No cervical lymphadenopathy. Unremarkable thyroid gland. UPPER CHEST: No pneumothorax or pleural effusion. No nodules or masses. AORTIC ARCH: There is mild calcific atherosclerosis of the aortic arch. There is  no aneurysm, dissection or hemodynamically significant stenosis of the visualized portion of the aorta. Conventional 3 vessel aortic branching pattern. The visualized proximal subclavian arteries are widely patent. RIGHT CAROTID SYSTEM: No dissection, occlusion or aneurysm. Mild atherosclerotic calcification at the carotid bifurcation without hemodynamically significant stenosis. LEFT CAROTID SYSTEM: No dissection, occlusion or aneurysm. There is mixed density atherosclerosis extending into the proximal ICA, resulting in 50% stenosis. VERTEBRAL ARTERIES: Left dominant configuration. Both origins are clearly patent. There is no dissection, occlusion or flow-limiting stenosis to the skull base (V1-V3 segments). CTA HEAD FINDINGS POSTERIOR CIRCULATION: --Vertebral arteries: Normal V4 segments. --Inferior cerebellar arteries: Normal. --Basilar artery: Normal. --Superior cerebellar arteries: Normal. --Posterior cerebral arteries (PCA): Normal. There are bilateral posterior communicating arteries (p-comm) that partially supply the PCAs. ANTERIOR CIRCULATION: --Intracranial internal carotid arteries: Atherosclerotic calcification of the internal carotid arteries at the skull base without hemodynamically significant stenosis. --Anterior cerebral arteries (ACA): Normal. Both A1 segments are present. Patent anterior communicating artery (a-comm). --Middle cerebral arteries (MCA): Normal. VENOUS SINUSES: As permitted by contrast timing, patent. ANATOMIC VARIANTS: None Review of the MIP images confirms the above findings. IMPRESSION: 1. No intracranial arterial occlusion or high-grade stenosis. 2. Approximately 50% stenosis of the proximal left internal carotid artery secondary to mixed density atherosclerosis. Aortic Atherosclerosis (ICD10-I70.0). Electronically Signed   By: Deatra Robinson M.D.   On: 01/16/2020 03:08   CT Code Stroke CTA Neck W/WO contrast  Result Date: 01/16/2020 CLINICAL DATA:  Facial droop EXAM: CT  ANGIOGRAPHY HEAD AND NECK TECHNIQUE: Multidetector CT imaging of the head and neck was performed using the standard protocol during bolus administration of intravenous contrast. Multiplanar CT image reconstructions and MIPs were obtained to evaluate the vascular anatomy. Carotid stenosis measurements (when applicable) are obtained utilizing NASCET criteria, using the distal internal carotid diameter as the denominator. CONTRAST:  92mL OMNIPAQUE IOHEXOL 350 MG/ML SOLN COMPARISON:  None. FINDINGS: CTA NECK FINDINGS SKELETON: There is no bony spinal canal stenosis. No lytic or blastic lesion. OTHER NECK: Normal pharynx, larynx and major salivary glands.  No cervical lymphadenopathy. Unremarkable thyroid gland. UPPER CHEST: No pneumothorax or pleural effusion. No nodules or masses. AORTIC ARCH: There is mild calcific atherosclerosis of the aortic arch. There is no aneurysm, dissection or hemodynamically significant stenosis of the visualized portion of the aorta. Conventional 3 vessel aortic branching pattern. The visualized proximal subclavian arteries are widely patent. RIGHT CAROTID SYSTEM: No dissection, occlusion or aneurysm. Mild atherosclerotic calcification at the carotid bifurcation without hemodynamically significant stenosis. LEFT CAROTID SYSTEM: No dissection, occlusion or aneurysm. There is mixed density atherosclerosis extending into the proximal ICA, resulting in 50% stenosis. VERTEBRAL ARTERIES: Left dominant configuration. Both origins are clearly patent. There is no dissection, occlusion or flow-limiting stenosis to the skull base (V1-V3 segments). CTA HEAD FINDINGS POSTERIOR CIRCULATION: --Vertebral arteries: Normal V4 segments. --Inferior cerebellar arteries: Normal. --Basilar artery: Normal. --Superior cerebellar arteries: Normal. --Posterior cerebral arteries (PCA): Normal. There are bilateral posterior communicating arteries (p-comm) that partially supply the PCAs. ANTERIOR CIRCULATION:  --Intracranial internal carotid arteries: Atherosclerotic calcification of the internal carotid arteries at the skull base without hemodynamically significant stenosis. --Anterior cerebral arteries (ACA): Normal. Both A1 segments are present. Patent anterior communicating artery (a-comm). --Middle cerebral arteries (MCA): Normal. VENOUS SINUSES: As permitted by contrast timing, patent. ANATOMIC VARIANTS: None Review of the MIP images confirms the above findings. IMPRESSION: 1. No intracranial arterial occlusion or high-grade stenosis. 2. Approximately 50% stenosis of the proximal left internal carotid artery secondary to mixed density atherosclerosis. Aortic Atherosclerosis (ICD10-I70.0). Electronically Signed   By: Deatra RobinsonKevin  Herman M.D.   On: 01/16/2020 03:08   CT HEAD CODE STROKE WO CONTRAST  Result Date: 01/16/2020 CLINICAL DATA:  Code stroke.  Facial droop EXAM: CT HEAD WITHOUT CONTRAST TECHNIQUE: Contiguous axial images were obtained from the base of the skull through the vertex without intravenous contrast. COMPARISON:  10/18/2018 FINDINGS: Brain: There is no mass, hemorrhage or extra-axial collection. There is generalized atrophy without lobar predilection. There is hypoattenuation of the periventricular white matter, most commonly indicating chronic ischemic microangiopathy. Vascular: Atherosclerotic calcification of the internal carotid arteries at the skull base. No abnormal hyperdensity of the major intracranial arteries or dural venous sinuses. Skull: The visualized skull base, calvarium and extracranial soft tissues are normal. Sinuses/Orbits: No fluid levels or advanced mucosal thickening of the visualized paranasal sinuses. No mastoid or middle ear effusion. The orbits are normal. ASPECTS [facility]([place] Stroke Program Early CT Score) - Ganglionic level infarction (caudate, lentiform nuclei, internal capsule, insula, M1-M3 cortex): 7 - Supraganglionic infarction (M4-M6 cortex): 3 Total score (0-10 with 10  being normal): 10 IMPRESSION: 1. No acute intracranial abnormality. 2. ASPECTS is 10. These results were communicated to Dr. Ritta SlotMcNeill Kirkpatrick at 2:35 am on 01/16/2020 by text page via the [facility] messaging system. Electronically Signed   By: Deatra RobinsonKevin  Herman M.D.   On: 01/16/2020 02:35    Procedures .Critical Care Performed by: Zadie RhineWickline, Meggen Spaziani, MD Authorized by: Zadie RhineWickline, Deyvi Bonanno, MD   Critical care provider statement:    Critical care time (minutes):  40   Critical care start time:  01/16/2020 2:30 AM   Critical care end time:  01/16/2020 3:10 AM   Critical care time was exclusive of:  Separately billable procedures and treating other patients   Critical care was necessary to treat or prevent imminent or life-threatening deterioration of the following conditions:  CNS failure or compromise   Critical care was time spent personally by me on the following activities:  Development of treatment plan with patient or surrogate, discussions with consultants, examination of patient,  re-evaluation of patient's condition, review of old charts, pulse oximetry, ordering and review of radiographic studies, ordering and performing treatments and interventions and ordering and review of laboratory studies   I assumed direction of critical care for this patient from another provider in my specialty: no        Medications Ordered in ED Medications  sodium chloride flush (NS) 0.9 % injection 3 mL (has no administration in time range)  lactated ringers bolus 2,000 mL (has no administration in time range)  iohexol (OMNIPAQUE) 350 MG/ML injection 75 mL (75 mLs Intravenous Contrast Given 01/16/20 0251)    ED Course  I have reviewed the triage vital signs and the nursing notes.  Pertinent labs & imaging results that were available during my care of the patient were reviewed by me and considered in my medical decision making (see chart for details).    MDM Rules/Calculators/A&P                          2:50  AM Patient seen on arrival as a code stroke.  Seen in conjunction with Dr. Amada Jupiter with stroke service Patient's airway was cleared and she was sent emergently for CT head.  On initial exam it appears the patient has a left facial droop with a left visual cut. After CT head, patient will have CT angiography 3:15 AM No large vessel occlusion noted on CT angiography Patient awake alert this time, but is still confused. After discussion with neurologist Dr. Amada Jupiter, plan admit to hospitalist for stroke evaluation. tPA in stroke considered but not given due to: Onset over 3-4.5hours No indication for emergent intervention at this time.  Of note, patient is mildly dehydrated and elevation of her glucose.  She does have an anion gap, though I suspect this will improve with IV fluids.  We will give IV fluids and recheck metabolic panel  Discussed with Dr. Debby Bud with hospitalist for admission   Final Clinical Impression(s) / ED Diagnoses Final diagnoses:  Cerebrovascular accident (CVA), unspecified mechanism (HCC)  Dehydration  Hyperglycemia    Rx / DC Orders ED Discharge Orders    None       Zadie Rhine, MD 01/16/20 [id number]

## 2020-01-16 NOTE — Consult Note (Signed)
Neurology Consultation Reason for Consult: Confusion Referring Physician: Bebe Shaggy, D  CC: Confusion  History is obtained from: Patient  HPI: Sydney Schultz is a 86 y.o. female with a history of "episodes" which previously been attributed to diabetes, TIAs, sleep attacks per family who presents with confusion that has been going on since sometime this evening.  She was last known well sometime between 5 and 6 PM, and then the patient's daughter states that the patient's husband called her around 28 and said that she was not acting right and was shaking.   LKW: 5 pm  tpa given?: no, out of window.    ROS:  Unable to obtain due to altered mental status.   Past Medical History:  Diagnosis Date  . Arthritis   . Diabetes (HCC)   . Dyspnea    with exertion  . GERD (gastroesophageal reflux disease)   . Hypertension   . Incontinence   . Sleep apnea    uses cpap  . Swelling of both lower extremities   . Wheezing      No family history on file.   Social History:  reports that she has never smoked. She has never used smokeless tobacco. She reports that she does not drink alcohol and does not use drugs.   Exam: Current vital signs: There were no vitals taken for this visit. Vital signs in last 24 hours: BP: ()/()  Arterial Line BP: ()/()    Physical Exam  Constitutional: Appears well-developed and well-nourished.  Psych: Affect appropriate to situation Eyes: No scleral injection HENT: No OP obstrucion MSK: no joint deformities.  Cardiovascular: Normal rate and regular rhythm.  Respiratory: Effort normal, non-labored breathing GI: Soft.  No distension. There is no tenderness.  Skin: WDI  Neuro: Mental Status: Patient is awake, alert, oriented to person only Patient is able to give a clear and coherent history. No signs of aphasia or neglect Cranial Nerves: II: L hemianopia,  Pupils are equal, round, and reactive to light.   III,IV, VI: EOMI without ptosis or  diploplia.  V: Facial sensation is symmetric to temperature VII: Facial movement is decreased on the left including forehead.  Motor: She has a prominent tremor bilaterally no drift in either upper extremity. She iwill not give significant effort in either lower extremity, 3/5 sensory: Sensation is diminished on the left Cerebellar: Intentional tremor present bilaterally.   I have reviewed labs in epic and the results pertinent to this consultation are: Creatinine 1.3   I have reviewed the images obtained: CT head - negative.   Impression: 86 year old female with new onset left hemianopia and left facial weakness as well as confusion.  I suspect that she has had an ischemic infarct, though current head CT is negative.  She will need further imaging with MRI and further stroke work-up.  Recommendations: - HgbA1c, fasting lipid panel - MRI  of the brain without contrast - Frequent neuro checks - Echocardiogram - Prophylactic therapy-Antiplatelet med: Aspirin -81 mg and Plavix 75 mg daily - Risk factor modification - Telemetry monitoring - PT consult, OT consult, Speech consult - Stroke team to follow    Ritta Slot, MD [facility] [phone]  If 7pm- 7am, please page neurology on call as listed in AMION.

## 2020-01-16 NOTE — ED Triage Notes (Signed)
Pt presents to ED BIB [facility]. Pt presents as CODE STROKE. Per family pt LKW 1700-1800. Pt found by husband @ 2130 not speaking correctly and more altered than normal. Initial NIH - 11.

## 2020-01-17 ENCOUNTER — Inpatient Hospital Stay (HOSPITAL_COMMUNITY): Payer: Medicare PPO

## 2020-01-17 DIAGNOSIS — I350 Nonrheumatic aortic (valve) stenosis: Secondary | ICD-10-CM

## 2020-01-17 DIAGNOSIS — R1312 Dysphagia, oropharyngeal phase: Secondary | ICD-10-CM

## 2020-01-17 DIAGNOSIS — I342 Nonrheumatic mitral (valve) stenosis: Secondary | ICD-10-CM

## 2020-01-17 DIAGNOSIS — I10 Essential (primary) hypertension: Secondary | ICD-10-CM

## 2020-01-17 DIAGNOSIS — G4733 Obstructive sleep apnea (adult) (pediatric): Secondary | ICD-10-CM

## 2020-01-17 DIAGNOSIS — R739 Hyperglycemia, unspecified: Secondary | ICD-10-CM

## 2020-01-17 DIAGNOSIS — I6340 Cerebral infarction due to embolism of unspecified cerebral artery: Secondary | ICD-10-CM

## 2020-01-17 DIAGNOSIS — I340 Nonrheumatic mitral (valve) insufficiency: Secondary | ICD-10-CM

## 2020-01-17 LAB — ECHOCARDIOGRAM COMPLETE
AR max vel: 0.61 cm2
AV Area VTI: 0.69 cm2
AV Area mean vel: 0.54 cm2
AV Mean grad: 31 mmHg
AV Peak grad: 50.7 mmHg
Ao pk vel: 3.56 m/s
Area-P 1/2: 2.39 cm2
Height: 62 in
S' Lateral: 2.3 cm
Weight: 1897.72 oz

## 2020-01-17 LAB — GLUCOSE, CAPILLARY
Glucose-Capillary: 96 mg/dL (ref 70–99)
Glucose-Capillary: 80 mg/dL (ref 70–99)
Glucose-Capillary: 70 mg/dL (ref 70–99)
Glucose-Capillary: 74 mg/dL (ref 70–99)
Glucose-Capillary: 63 mg/dL — ABNORMAL LOW (ref 70–99)
Glucose-Capillary: 117 mg/dL — ABNORMAL HIGH (ref 70–99)

## 2020-01-17 MED ORDER — DEXTROSE 50 % IV SOLN
12.5000 g | INTRAVENOUS | Status: AC
  Administered 2020-01-17: 12.5 g via INTRAVENOUS
  Filled 2020-01-17: qty 50

## 2020-01-17 NOTE — Progress Notes (Signed)
STROKE TEAM PROGRESS NOTE   INTERVAL HISTORY Daughter at bedside. Pt lying in bed, initially sleeping however, easily arousable. Pleasant and able to follow limited simple commands but not orientated due to underlying dementia. PT/OT recommend SNF but family would like to take her home. However, pt has not passed swallow yet and may need tube feeding.   OBJECTIVE Vitals:   01/17/20 0200 01/17/20 0400 01/17/20 0700 01/17/20 1242  BP: 131/60 (!) 137/57 134/62 (!) 141/95  Pulse:  72 79 74  Resp:      Temp: 99.6 F (37.6 C) 99.5 F (37.5 C) 98.9 F (37.2 C) 97.7 F (36.5 C)  TempSrc: Axillary Axillary Axillary Axillary  SpO2:  99% 98% 100%  Weight:      Height:       CBC:  Recent Labs  Lab 01/16/20 0220 01/16/20 0225  WBC 9.6  --   NEUTROABS 7.8*  --   HGB 13.8 15.6*  HCT 45.4 46.0  MCV 97.2  --   PLT 257  --    Basic Metabolic Panel:  Recent Labs  Lab 01/16/20 0220 01/16/20 0220 01/16/20 0225 01/16/20 0507  NA 141   < > 143 143  K 3.5   < > 3.4* 3.7  CL 107   < > 108 107  CO2 17*  --   --  20*  GLUCOSE 244*   < > 221* 141*  BUN 16   < > 18 16  CREATININE 1.50*   < > 1.30* 1.30*  CALCIUM 10.8*  --   --  10.8*   < > = values in this interval not displayed.   Lipid Panel:     Component Value Date/Time   CHOL 209 (H) 01/16/2020 0500   TRIG 68 01/16/2020 0500   HDL 64 01/16/2020 0500   CHOLHDL 3.3 01/16/2020 0500   VLDL 14 01/16/2020 0500   LDLCALC 131 (H) 01/16/2020 0500   HgbA1c:  Lab Results  Component Value Date   HGBA1C 5.6 01/16/2020   Urine Drug Screen: No results found for: LABOPIA, COCAINSCRNUR, LABBENZ, AMPHETMU, THCU, LABBARB  Alcohol Level No results found for: ETH  IMAGING  CT Code Stroke CTA Head W/WO contrast CT Code Stroke CTA Neck W/WO contrast 01/16/2020   ADDENDUM: Dr. Roda Shutters called for clarification of an arterial stenosis reference in a subsequent brain MRI report. The patient's right PCA territory infarct is associated with a severe P2  segment stenosis with flow gap. There is also a flow reducing left P1 segment stenosis which is ameliorated by a posterior communicating artery.  IMPRESSION:  1. No intracranial arterial occlusion or high-grade stenosis.  2. Approximately 50% stenosis of the proximal left internal carotid artery secondary to mixed density atherosclerosis.  Aortic Atherosclerosis (ICD10-I70.0).   MR BRAIN WO CONTRAST 01/16/2020 MPRESSION:  1. Acute right PCA territory infarct, predominantly involving the temporal and occipital cortex.  2. Small acute infarct of the left thalamus.  3. Short segment occlusion of the right P2 segment with distal reconstitution demonstrated on earlier CTA.  4. Severe chronic ischemic microangiopathy.   CT HEAD CODE STROKE WO CONTRAST 01/16/2020 MPRESSION:  1. No acute intracranial abnormality.  2. ASPECTS is 10.   DG Chest 2 View 01/16/2020 IMPRESSION:  No evidence of acute cardiopulmonary disease.   Transthoracic Echocardiogram  1. Left ventricular ejection fraction, by estimation, is 60 to 65%. The left ventricle has normal function. The left ventricle has no regional wall motion abnormalities. Left ventricular diastolic parameters are consistent  with Grade II diastolic dysfunction (pseudonormalization). Elevated left ventricular end-diastolic pressure.  2. Right ventricular systolic function is normal. The right ventricular size is normal. There is moderately elevated pulmonary artery systolic pressure. The estimated right ventricular systolic pressure is 60.8 mmHg.  3. Left atrial size was severely dilated.  4. The mitral valve is normal in structure. Mild mitral valve regurgitation. Moderate mitral stenosis. The mean mitral valve gradient is 8.0 mmHg.  5. Tricuspid valve regurgitation is moderate.  6. The aortic valve is tricuspid. Aortic valve regurgitation is not visualized. Moderate aortic valve stenosis. Aortic valve mean gradient measures 31.0 mmHg. Aortic valve  Vmax measures 3.56 m/s.  7. The inferior vena cava is normal in size with greater than 50% respiratory variability, suggesting right atrial pressure of 3 mmHg.   Bilateral Lower Extremity Venous Dopplers   RIGHT:  - Findings appear essentially unchanged compared to previous examination.  - There is no evidence of deep vein thrombosis in the lower extremity.   LEFT:  - Findings appear essentially unchanged compared to previous examination.  - There is no evidence of deep vein thrombosis in the lower extremity.   ECG - SR rate 83 BPM. (See cardiology reading for complete details)  PHYSICAL EXAM    Temp:  [97.7 F (36.5 C)-100.1 F (37.8 C)] 97.7 F (36.5 C) (09/06 1242) Pulse Rate:  [72-90] 74 (09/06 1242) Resp:  [15-21] 20 (09/05 2250) BP: (131-166)/(57-126) 141/95 (09/06 1242) SpO2:  [95 %-100 %] 100 % (09/06 1242)  General - Well nourished, well developed, in no apparent distress, sleepy.  Ophthalmologic - fundi not visualized due to noncooperation.  Cardiovascular - Regular rhythm and rate.  Neuro - sleepy but easily arousable, eyes open, orientated to self and people but not to time or place.  Mild dysarthria, paucity of speech, limited answers for questions, but able to speak short sentences spontaneously, not cooperative with naming or repetition.  Bilateral gaze nystagmus, bilateral directions. Disconjugate eyes, with left eye upward deviation. PERRL. Left hemianopia.  Left complete facial palsy with decreased left frontal ring toes, left watery eye, decreased left eye blinking, significanct left facial droop, however left palpebral fissure similar to or even smaller than the right.  Tongue midline.  Bilateral upper extremity 4/5, bilateral lower extremity 2/5 proximal and 3/5 distal PF/DF.  Sensation subjectively symmetrical, but coordination not corporative.  Gait not tested.   ASSESSMENT/PLAN Ms. EVON DEJARNETT is a 86 y.o. female with history of  "episodes" which  previously been attributed to diabetes, TIAs, "sleep attacks"DM, HTN, CAD (s/p stent) and OSA, presents with confusion and shaking. She did not receive IV t-PA due to late presentation (>4.5 hours from time of onset).  Stroke: acute right PCA, right lower pontine, left thalamic infarcts - embolic pattern, source unclear.  Code Stroke CT Head -  No acute intracranial abnormality. ASPECTS is 10.     CTA H&N - severe P2 segment stenosis with flow gap. There is also a flow reducing left P1 segment stenosis which is ameliorated by a posterior communicating artery.  MRI head - Acute right PCA territory infarct, predominantly involving the temporal and occipital cortex. Small acute infarct of the left thalamus. Also left lower pontine infarct.    LE Venous Dopplers - neg DVT  2D Echo - EF 60-56%. No source of embolus. LA severely dilated.   Recommend 30-day cardiac event monitoring as outpatient to rule out A. fib  Sars Corona Virus 2 - negative  LDL - 131  HgbA1c - 5.6  VTE prophylaxis - Lovenox  clopidogrel 75 mg daily prior to admission, now on aspirin 300 supp due to NPO status  Therapy recommendations:  SNF, but family would like to take her home  Disposition:  Pending  Episodes of altered consciousness  Per daughter, she has been having the unresponsive episodes for some time, lasting hours and resolved. She called it "diabetic episodes", but did not check glucose during the episodes.   Follows with Dr. Malvin Johns and NP Morrie Sheldon at [facility]  Etiology unclear, DDx including vertebrobasilar insufficiency, Lewy body dementia, seizure, syncope, hypoglycemia episode  Continue follow up with Dr. Malvin Johns and NP Morrie Sheldon  Hypertension  Home BP meds: irbesartan ; HCTZ  Current BP meds: none   Stable  . Long-term BP goal normotensive  Hyperlipidemia  Home Lipid lowering medication: none (Vytorin in the past)  LDL 131, goal < 70  Consider statin once po access  Continue  statin at discharge  Diabetes  Home diabetic meds: Jardiance  Current diabetic meds: SSI   HgbA1c 5.6, goal < 7.0  CBG monitoring  PCP follow up  Dysphagia . Secondary to stroke . NPO, meds via alternative means . Speech on board . May need tube feeding  Other Stroke Risk Factors  Advanced age  Coronary artery disease  Obstructive sleep apnea, on CPAP at home  Other Active Problems  Code status - DNR  Dementia - resume Aricept once po access CKD - stage  3b - creatinine - 1.50->1.30->1.30 GERD  Hospital day # 1   Marvel Plan, MD PhD Stroke Neurology 01/17/2020 1:02 PM  To contact Stroke Continuity provider, please refer to [email or web address]. After hours, contact General Neurology

## 2020-01-17 NOTE — Progress Notes (Signed)
  Echocardiogram 2D Echocardiogram has been performed.  Sydney Schultz 01/17/2020, 12:01 PM

## 2020-01-17 NOTE — NC FL2 (Signed)
[place] MEDICAID FL2 LEVEL OF CARE SCREENING TOOL     IDENTIFICATION  Patient Name: Sydney Schultz Birthdate: [birth year] Sex: female Admission Date (Current Location): 01/16/2020  [facility] and [place] Number:  Producer, television/film/video and Address:  [facility], [address], [place], [place] [address]      Provider Number: [id number]  Attending Physician Name and Address:  Calvert Cantor, MD  Relative Name and Phone Number:       Current Level of Care: Hospital Recommended Level of Care: Skilled Nursing Facility Prior Approval Number:    Date Approved/Denied:   PASRR Number: [id number]  Discharge Plan: SNF    Current Diagnoses: Patient Active Problem List   Diagnosis Date Noted  . CVA (cerebral vascular accident) (HCC) 01/16/2020  . DM (diabetes mellitus) (HCC) 01/16/2020  . HTN (hypertension) 01/16/2020  . GERD (gastroesophageal reflux disease) 01/16/2020  . OSA (obstructive sleep apnea) 01/16/2020    Orientation RESPIRATION BLADDER Height & Weight     Self  Normal Incontinent Weight: 118 lb 9.7 oz (53.8 kg) Height:  5\' 2"  (157.5 cm)  BEHAVIORAL SYMPTOMS/MOOD NEUROLOGICAL BOWEL NUTRITION STATUS      Incontinent Diet (see DC summary)  AMBULATORY STATUS COMMUNICATION OF NEEDS Skin   Extensive Assist Verbally Normal                       Personal Care Assistance Level of Assistance  Bathing, Feeding, Dressing Bathing Assistance: Maximum assistance Feeding assistance: Maximum assistance Dressing Assistance: Maximum assistance     Functional Limitations Info  Speech     Speech Info: Impaired (dysarthria)    SPECIAL CARE FACTORS FREQUENCY  PT (By licensed PT), OT (By licensed OT), Speech therapy     PT Frequency: 5x/wk OT Frequency: 5x/wk     Speech Therapy Frequency: 5x/wk      Contractures Contractures Info: Not present    Additional Factors Info  Code Status, Allergies, Insulin Sliding Scale Code Status Info:  DNR Allergies Info: NKA   Insulin Sliding Scale Info: 0-15 units 3x/day with meals       Current Medications (01/17/2020):  This is the current hospital active medication list Current Facility-Administered Medications  Medication Dose Route Frequency Provider Last Rate Last Admin  .  stroke: mapping our early stages of recovery book   Does not apply Once Norins, 03/18/2020, MD      . 0.9 %  sodium chloride infusion   Intravenous Continuous Norins, Rosalyn Gess, MD 50 mL/hr at 01/17/20 0200 Rate Verify at 01/17/20 0200  . acetaminophen (TYLENOL) tablet 650 mg  650 mg Oral Q4H PRN Norins, 03/18/20, MD       Or  . acetaminophen (TYLENOL) 160 MG/5ML solution 650 mg  650 mg Per Tube Q4H PRN Norins, Rosalyn Gess, MD       Or  . acetaminophen (TYLENOL) suppository 650 mg  650 mg Rectal Q4H PRN Norins, Rosalyn Gess, MD      . acetaminophen (TYLENOL) tablet 650 mg  650 mg Oral BID Norins, Rosalyn Gess, MD      . aspirin suppository 300 mg  300 mg Rectal Daily Rosalyn Gess, MD   300 mg at 01/17/20 1348  . clopidogrel (PLAVIX) tablet 75 mg  75 mg Oral QPM Norins, 03/18/20, MD      . colestipol (COLESTID) tablet 2 g  2 g Oral QPM Norins, Rosalyn Gess, MD      . enoxaparin (LOVENOX)  injection 30 mg  30 mg Subcutaneous Q24H Norins, Rosalyn Gess, MD   30 mg at 01/17/20 1348  . simvastatin (ZOCOR) tablet 40 mg  40 mg Oral q1800 Calvert Cantor, MD       And  . ezetimibe (ZETIA) tablet 10 mg  10 mg Oral q1800 Rizwan, Ladell Heads, MD      . insulin aspart (novoLOG) injection 0-15 Units  0-15 Units Subcutaneous TID WC Norins, Rosalyn Gess, MD      . oxybutynin (DITROPAN) tablet 5 mg  5 mg Oral BID Norins, Rosalyn Gess, MD      . pantoprazole (PROTONIX) EC tablet 40 mg  40 mg Oral Daily Norins, Rosalyn Gess, MD      . senna-docusate (Senokot-S) tablet 1 tablet  1 tablet Oral QHS PRN Norins, Rosalyn Gess, MD         Discharge Medications: Please see discharge summary for a list of discharge medications.  Relevant Imaging Results:  Relevant  Lab Results:   Additional Information SS#: [id number]  Baldemar Lenis, LCSW

## 2020-01-17 NOTE — Progress Notes (Signed)
Modified Barium Swallow Progress Note  Patient Details  Name: Sydney Schultz MRN: [id number] Date of Birth: [birth year]  Today's Date: 01/17/2020  Modified Barium Swallow completed.  Full report located under Chart Review in the Imaging Section.  Brief recommendations include the following:  Clinical Impression   Pt has a moderate oropahryngeal dysphagia with oral intake quite effortful across trials. She has very limited labial seal so that she is unable to suck from a straw, but she also has trouble sealing her lips around a cup or spoon. Anterior loss is significant with thin liquids, at times only getting a trace amount of liquid in her mouth. Oral holding is more prominent with purees, but her attempts at oral transit once initiated are sluggish and small. Her base of tongue retraction and pharyngeal squeeze are also reduced, limiting full epiglottic inversion and resulting in vallecular residue. Residue also coats her tongue, with most of the residue collecting more posteriorly at the base of her tongue. Thin liquids do help to clear some of this residue, but it takes her multiple bolus attempts until she can keep enough thin liquid in her mouth. Her airway protection is relatively intact though, with trace and transient penetration observed x1. Overall, her swallowing is not efficient to provide a consistent or adequate source of intake at this time. Would offer small, single sips of water with full supervision after oral care to try to facilitate use of swallowing musculature and provide moisture. SLP will f/u for additional therapeutic boluses with potential to initiate PO diet as efficiency improves.    Swallow Evaluation Recommendations       SLP Diet Recommendations: NPO;Other (Comment) (few small sips of water after oral care)   Liquid Administration via: Spoon;Cup   Medication Administration: Via alternative means   Supervision: Full assist for feeding;Staff to assist with self  feeding           Oral Care Recommendations: Oral care QID   Other Recommendations: Have oral suction available    Mahala Menghini., M.A. CCC-SLP Acute Rehabilitation Services Pager [phone] Office [phone]  01/17/2020,1:34 PM

## 2020-01-17 NOTE — Evaluation (Signed)
Clinical/Bedside Swallow Evaluation Patient Details  Name: Sydney Schultz MRN: [id number] Date of Birth: 1931-06-04  Today's Date: 01/17/2020 Time: SLP Start Time (ACUTE ONLY): 0931 SLP Stop Time (ACUTE ONLY): 0957 SLP Time Calculation (min) (ACUTE ONLY): 26 min  Past Medical History:  Past Medical History:  Diagnosis Date  . Arthritis   . Diabetes (HCC)   . Dyspnea    with exertion  . GERD (gastroesophageal reflux disease)   . Hypertension   . Incontinence   . Sleep apnea    uses cpap  . Swelling of both lower extremities   . Wheezing    Past Surgical History:  Past Surgical History:  Procedure Laterality Date  . ANKLE SURGERY    . CARDIAC CATHETERIZATION     coronary stent  . CARPAL TUNNEL RELEASE Right   . CATARACT EXTRACTION W/PHACO Left 07/29/2017   Procedure: CATARACT EXTRACTION PHACO AND INTRAOCULAR LENS PLACEMENT (IOC);  Surgeon: Galen Manila, MD;  Location: [facility] ORS;  Service: Ophthalmology;  Laterality: Left;  [place] 00:38.9 AP% 12.0 CDE 4.68 Fluid Pack Lot # [id number]  . CATARACT EXTRACTION W/PHACO Right 08/19/2017   Procedure: CATARACT EXTRACTION PHACO AND INTRAOCULAR LENS PLACEMENT (IOC);  Surgeon: Galen Manila, MD;  Location: [facility] ORS;  Service: Ophthalmology;  Laterality: Right;  [place] 00:35 AP% 16.0 CDE 5.69 Fluid pack lot # [id number]  . CESAREAN SECTION     x 2  . CORONARY ANGIOPLASTY     STENT  . FOOT SURGERY Left    ankle tendon surgery  . JOINT REPLACEMENT Bilateral    TKR/ RIGHT SHOULDER  . KNEE SURGERY Bilateral    total knee replacements  . SHOULDER SURGERY Right    shoulder replacement  . tumor removed     neck   HPI:  Pt is an 86 yo female presenting with AMS and L facial droop. MRI showed R lower pontine, R PCA, and L thalamic infarcts. PMH includes: DM, TIAs, GERD, HTN, OSA, wheezing, OA   Assessment / Plan / Recommendation Clinical Impression  Pt has L-sided facial weakness (CN VII) with incomplete labial seal around cup and straw.  Thin liquids spill from her oral cavity on the L sided in larger volumes when attempting cup sips, but she also has trouble consistently being able to suck liquids up via straw. She has delayed oral transit with thin liquids, holding them briefly in her mouth before swallowing and exhibiting multiple swallows per bolus. With bites of puree her oral holding is more significant. She does not swallow them unless provided with a liquid wash, and expectorating it x1. Given signs of an acute, neurogenic dysphagia, recommend proceeding with MBS prior to diet initiation. Will complete as can be scheduled - per radiology, this may be tomorrow at the earliest. Pt/family and RN made aware.   SLP Visit Diagnosis: Dysphagia, oropharyngeal phase (R13.12)    Aspiration Risk  Moderate aspiration risk    Diet Recommendation NPO   Medication Administration: Via alternative means    Other  Recommendations Oral Care Recommendations: Oral care QID   Follow up Recommendations Inpatient Rehab      Frequency and Duration            Prognosis Prognosis for Safe Diet Advancement: Good Barriers to Reach Goals: Cognitive deficits      Swallow Study   General HPI: Pt is an 86 yo female presenting with AMS and L facial droop. MRI showed R lower pontine, R PCA, and L thalamic infarcts. PMH includes: DM,  TIAs, GERD, HTN, OSA, wheezing, OA Type of Study: Bedside Swallow Evaluation Previous Swallow Assessment: none in chart Diet Prior to this Study: NPO Temperature Spikes Noted: Yes (100.1) Respiratory Status: Room air History of Recent Intubation: No Behavior/Cognition: Alert;Cooperative;Requires cueing Oral Cavity Assessment: Excessive secretions (mild) Oral Care Completed by SLP: No Oral Cavity - Dentition: Adequate natural dentition Self-Feeding Abilities: Needs assist Patient Positioning: Upright in chair Baseline Vocal Quality: Normal Volitional Swallow: Unable to elicit    Oral/Motor/Sensory Function  Overall Oral Motor/Sensory Function: Moderate impairment (reduced command following for exam) Facial ROM: Reduced left;Suspected CN VII (facial) dysfunction Facial Symmetry: Abnormal symmetry left;Suspected CN VII (facial) dysfunction Facial Strength: Reduced left;Suspected CN VII (facial) dysfunction Lingual ROM: Within Functional Limits   Ice Chips Ice chips: Not tested   Thin Liquid Thin Liquid: Impaired Presentation: Cup;Self Fed;Straw Oral Phase Impairments: Reduced labial seal Oral Phase Functional Implications: Left anterior spillage;Oral holding Pharyngeal  Phase Impairments: Multiple swallows    Nectar Thick Nectar Thick Liquid: Not tested   Honey Thick Honey Thick Liquid: Not tested   Puree Puree: Impaired Presentation: Spoon Oral Phase Functional Implications: Oral holding;Other (comment) (spitting)   Solid     Solid: Not tested      Mahala Menghini., M.A. CCC-SLP Acute Rehabilitation Services Pager [phone] Office [phone]  01/17/2020,10:09 AM

## 2020-01-17 NOTE — Progress Notes (Signed)
VASCULAR LAB    Bilateral lower extremity venous duplex has been performed.  See CV proc for preliminary results.   Dontea Corlew, RVT 01/17/2020, 11:31 AM

## 2020-01-17 NOTE — Evaluation (Signed)
Occupational Therapy Evaluation Patient Details Name: Sydney Schultz MRN: [id number] DOB: 31-Aug-1931 Today's Date: 01/17/2020    History of Present Illness Pt is a 86 y/o female with PMH of DM, HTN, OA; presenting with AMS and progressive decline in ADLS, mobility then L facial droop/slurred speech 9/4. MRI reveals acute lower pontine, R PCA territory infarct, L thalamic infarcts- embolic pattern.    Clinical Impression   Patient admitted for above and limited by problem list below, including impaired balance, generalized weakness, decreased activity tolerance, impaired vision and cognition.  Patients daughter present and reports PTA patient required assist for dressing, some assist for bathing, able to eat/groom, used rollator for mobility but progressive decline and required assist for transfers recently.  Patient currently requires total assist +2 for transfers using stedy, max assist to wash face and total assist of 1-2 for for remainder of ADLs.  Sitting unsupported presents with L lateral and posterior lean at best completing static sitting with min assist.  Patients vision needs further assessment, question diplopia, but noted nystagmus with head turns and L visual field cut.  Patient follows minimal commands with increased time, but is HOH; but does reports "this is different" when she has trouble maintaining balance at EOB demonstrating fair awareness. Believe she will best benefit from further OT services while admitted and after dc at SNF Level to decrease burden of care and optimize return to PLOF with ADLs/ mobility.  Will follow.     Follow Up Recommendations  SNF;Supervision/Assistance - 24 hour    Equipment Recommendations  Other (comment) (TBD at next venue of care )    Recommendations for Other Services       Precautions / Restrictions Precautions Precautions: Fall Restrictions Weight Bearing Restrictions: No      Mobility Bed Mobility Overal bed mobility: Needs  Assistance Bed Mobility: Supine to Sit     Supine to sit: Total assist;HOB elevated     General bed mobility comments: total assist with minimal initation of BLEs towards EOB   Transfers Overall transfer level: Needs assistance   Transfers: Sit to/from Stand;Stand Pivot Transfers Sit to Stand: Total assist;+2 physical assistance;+2 safety/equipment Stand pivot transfers: Total assist;+2 physical assistance;+2 safety/equipment       General transfer comment: total assist +2 to power up from EOB with cueing for hand placement and technqiue using stedy to pivot to recliner     Balance Overall balance assessment: Needs assistance Sitting-balance support: Feet supported;Bilateral upper extremity supported Sitting balance-Leahy Scale: Poor Sitting balance - Comments: at best min assist with BUE hand held support but otherwise +2 due to L lateral and posterior lean  Postural control: Left lateral lean;Posterior lean Standing balance support: Bilateral upper extremity supported;During functional activity Standing balance-Leahy Scale: Zero                             ADL either performed or assessed with clinical judgement   ADL Overall ADL's : Needs assistance/impaired Eating/Feeding: NPO   Grooming: Maximal assistance;Sitting Grooming Details (indicate cue type and reason): hand over hand to wash face                              Functional mobility during ADLs: Total assistance;+2 for physical assistance;+2 for safety/equipment General ADL Comments: total assist for all ADLs at this time      Vision Baseline Vision/History: Cataracts Patient Visual Report: Diplopia;Blurring  of vision Vision Assessment?: Vision impaired- to be further tested in functional context Additional Comments: patient with L sided visual deficits, nystagumus noted with head turns to locate therapist, reports "2" when looking at PT and locating "# of fingers in all fields"       Perception     Praxis      Pertinent Vitals/Pain Pain Assessment: Faces Faces Pain Scale: No hurt     Hand Dominance Right   Extremity/Trunk Assessment Upper Extremity Assessment Upper Extremity Assessment: Generalized weakness;LUE deficits/detail LUE Deficits / Details: L UE grossly 3-/5 MMT (compared to 3+/5 MMT R UE) LUE Coordination: decreased fine motor;decreased gross motor   Lower Extremity Assessment Lower Extremity Assessment: Defer to PT evaluation   Cervical / Trunk Assessment Cervical / Trunk Assessment: Kyphotic   Communication Communication Communication: Receptive difficulties;Expressive difficulties;HOH   Cognition Arousal/Alertness: Awake/alert Behavior During Therapy: Flat affect Overall Cognitive Status: Impaired/Different from baseline Area of Impairment: Following commands;Awareness;Problem solving;Memory;Orientation;Attention;Safety/judgement                 Orientation Level: Disoriented to;Situation;Time Current Attention Level: Focused Memory: Decreased short-term memory Following Commands: Follows one step commands with increased time;Follows multi-step commands inconsistently;Follows one step commands inconsistently Safety/Judgement: Decreased awareness of safety Awareness: Emergent Problem Solving: Slow processing;Decreased initiation;Difficulty sequencing;Requires verbal cues;Requires tactile cues General Comments: difficult to assess, pt HOH and unclear if she is understanding commands accurately;  patient with fair awareness of deficits reporting "this is different" during mobilization but pt disoriented to time and unable to state the year of her birthday    General Comments  daughter present and supportive, agreeable to rehab     Exercises     Shoulder Instructions      Home Living Family/patient expects to be discharged to:: Private residence Living Arrangements: Spouse/significant other;Children;Other relatives Available  Help at Discharge: Family;Available 24 hours/day Type of Home: House Home Access: Stairs to enter [employer] of Steps: 1 (threshold step )   Home Layout: One level     Bathroom Shower/Tub: Chief Strategy Officer: Standard     Home Equipment: Bedside commode;Shower seat;Walker - 4 wheels          Prior Functioning/Environment Level of Independence: Needs assistance  Gait / Transfers Assistance Needed: Uses the rollator all the time ADL's / Homemaking Assistance Needed: Daughter assists with ADL's - Can wash herself but daughter does all dressing            OT Problem List: Decreased strength;Decreased activity tolerance;Impaired balance (sitting and/or standing);Impaired vision/perception;Decreased coordination;Decreased cognition;Decreased safety awareness;Decreased range of motion;Decreased knowledge of use of DME or AE;Decreased knowledge of precautions;Impaired UE functional use      OT Treatment/Interventions: Self-care/ADL training;DME and/or AE instruction;Therapeutic activities;Balance training;Patient/family education;Cognitive remediation/compensation;Visual/perceptual remediation/compensation;Neuromuscular education    OT Goals(Current goals can be found in the care plan section) Acute Rehab OT Goals Patient Stated Goal: none stated Time For Goal Achievement: 01/31/20 Potential to Achieve Goals: Good  OT Frequency: Min 2X/week   Barriers to D/C:            Co-evaluation PT/OT/SLP Co-Evaluation/Treatment: Yes Reason for Co-Treatment: For patient/therapist safety;To address functional/ADL transfers;Complexity of the patient's impairments (multi-system involvement)   OT goals addressed during session: ADL's and self-care      AM-PAC OT "6 Clicks" Daily Activity     Outcome Measure Help from another person eating meals?: Total Help from another person taking care of personal grooming?: A Lot Help from another person toileting, which  includes using toliet, bedpan, or urinal?: Total Help from another person bathing (including washing, rinsing, drying)?: Total Help from another person to put on and taking off regular upper body clothing?: Total Help from another person to put on and taking off regular lower body clothing?: Total 6 Click Score: 7   End of Session Equipment Utilized During Treatment: Gait belt Nurse Communication: Mobility status  Activity Tolerance: Patient tolerated treatment well Patient left: in chair;with call bell/phone within reach;with chair alarm set;with family/visitor present  OT Visit Diagnosis: Other abnormalities of gait and mobility (R26.89);Muscle weakness (generalized) (M62.81);Other symptoms and signs involving cognitive function;Other symptoms and signs involving the nervous system (R29.898)                Time: 1607-3710 OT Time Calculation (min): 38 min Charges:  OT General Charges $OT Visit: 1 Visit OT Evaluation $OT Eval Moderate Complexity: 1 Mod OT Treatments $Self Care/Home Management : 8-22 mins  Barry Brunner, OT Acute Rehabilitation Services Pager [phone] Office [phone]    Chancy Milroy 01/17/2020, 11:04 AM

## 2020-01-17 NOTE — Evaluation (Signed)
Physical Therapy Evaluation Patient Details Name: Sydney Schultz MRN: [id number] DOB: [birth year] Today's Date: 01/17/2020   History of Present Illness  Pt is a 86 y/o female with PMH of DM, HTN, OA; presenting with AMS and progressive decline in ADLS, mobility then L facial droop/slurred speech 9/4. MRI reveals acute lower pontine, R PCA territory infarct, L thalamic infarcts- embolic pattern.     Clinical Impression  Pt admitted with above diagnosis. Pt currently with functional limitations due to the deficits listed below (see PT Problem List). At the time of PT eval pt was able to perform transfers with up to +2 total assist. Stedy utilized for transition bed>chair. Daughter present and supportive throughout session. She reports being able to manage transfers with the pt well on most days but occasionally will get phone calls from other family members (the patient's husband and grandson) reporting they cannot get the pt up when the daughter is not there. The patient typically spends most of her day in the chair and sounds like only occasionally gets up during the day to walk to the bathroom. Based on PLOF and performance today, feel pt would benefit the most from SNF level rehab at d/c. Acutely, this pt will benefit from skilled PT to increase their independence and safety with mobility to allow discharge to the venue listed below.       Follow Up Recommendations SNF;Supervision/Assistance - 24 hour    Equipment Recommendations  None recommended by PT (TBD by next venue of care)    Recommendations for Other Services       Precautions / Restrictions Precautions Precautions: Fall Restrictions Weight Bearing Restrictions: No      Mobility  Bed Mobility Overal bed mobility: Needs Assistance Bed Mobility: Supine to Sit     Supine to sit: Total assist;HOB elevated     General bed mobility comments: total assist with minimal initation of BLEs towards EOB   Transfers Overall  transfer level: Needs assistance   Transfers: Sit to/from Stand Sit to Stand: Total assist;+2 physical assistance;+2 safety/equipment Stand pivot transfers: Total assist;+2 physical assistance;+2 safety/equipment       General transfer comment: total assist +2 to power up from EOB with cueing for hand placement and technqiue using stedy to pivot to recliner   Ambulation/Gait             General Gait Details: Unable to progress to gait training  Stairs            Wheelchair Mobility    Modified Rankin (Stroke Patients Only) Modified Rankin (Stroke Patients Only) Pre-Morbid Rankin Score: Moderately severe disability Modified Rankin: Severe disability     Balance Overall balance assessment: Needs assistance Sitting-balance support: Feet supported;Bilateral upper extremity supported Sitting balance-Leahy Scale: Poor Sitting balance - Comments: at best min assist with BUE hand held support but otherwise +2 due to L lateral and posterior lean  Postural control: Left lateral lean;Posterior lean Standing balance support: Bilateral upper extremity supported;During functional activity Standing balance-Leahy Scale: Zero                               Pertinent Vitals/Pain Pain Assessment: Faces Faces Pain Scale: No hurt    Home Living Family/patient expects to be discharged to:: Private residence Living Arrangements: Spouse/significant other;Children;Other relatives Available Help at Discharge: Family;Available 24 hours/day Type of Home: House Home Access: Stairs to enter   [employer] of Steps: 1 (threshold step )  Home Layout: One level Home Equipment: Bedside commode;Shower seat;Walker - 4 wheels      Prior Function Level of Independence: Needs assistance   Gait / Transfers Assistance Needed: Uses the rollator all the time  ADL's / Homemaking Assistance Needed: Daughter assists with ADL's - Can wash herself but daughter does all  dressing        Hand Dominance   Dominant Hand: Right    Extremity/Trunk Assessment   Upper Extremity Assessment Upper Extremity Assessment: Defer to OT evaluation LUE Deficits / Details: L UE grossly 3-/5 MMT (compared to 3+/5 MMT R UE) LUE Coordination: decreased fine motor;decreased gross motor    Lower Extremity Assessment Lower Extremity Assessment: Generalized weakness    Cervical / Trunk Assessment Cervical / Trunk Assessment: Kyphotic  Communication   Communication: Receptive difficulties;Expressive difficulties;HOH  Cognition Arousal/Alertness: Awake/alert Behavior During Therapy: Flat affect Overall Cognitive Status: Impaired/Different from baseline Area of Impairment: Following commands;Awareness;Problem solving;Memory;Orientation;Attention;Safety/judgement                 Orientation Level: Disoriented to;Situation;Time Current Attention Level: Focused Memory: Decreased short-term memory Following Commands: Follows one step commands with increased time;Follows multi-step commands inconsistently;Follows one step commands inconsistently Safety/Judgement: Decreased awareness of safety Awareness: Emergent Problem Solving: Slow processing;Decreased initiation;Difficulty sequencing;Requires verbal cues;Requires tactile cues General Comments: difficult to assess, pt HOH and unclear if she is understanding commands accurately;  patient with fair awareness of deficits reporting "this is different" during mobilization but pt disoriented to time, place, and unable to state the year of her birthday       General Comments General comments (skin integrity, edema, etc.): daughter present and supportive, agreeable to rehab     Exercises     Assessment/Plan    PT Assessment Patient needs continued PT services  PT Problem List Decreased strength;Decreased activity tolerance;Decreased balance;Decreased mobility;Decreased cognition;Decreased knowledge of use of  DME;Decreased safety awareness;Decreased knowledge of precautions       PT Treatment Interventions DME instruction;Gait training;Functional mobility training;Therapeutic activities;Therapeutic exercise;Neuromuscular re-education;Patient/family education;Cognitive remediation    PT Goals (Current goals can be found in the Care Plan section)  Acute Rehab PT Goals Patient Stated Goal: none stated PT Goal Formulation: With patient/family Time For Goal Achievement: 01/31/20 Potential to Achieve Goals: Good    Frequency Min 3X/week   Barriers to discharge        Co-evaluation PT/OT/SLP Co-Evaluation/Treatment: Yes Reason for Co-Treatment: Necessary to address cognition/behavior during functional activity;For patient/therapist safety;To address functional/ADL transfers PT goals addressed during session: Mobility/safety with mobility;Balance;Strengthening/ROM OT goals addressed during session: ADL's and self-care       AM-PAC PT "6 Clicks" Mobility  Outcome Measure Help needed turning from your back to your side while in a flat bed without using bedrails?: Total Help needed moving from lying on your back to sitting on the side of a flat bed without using bedrails?: Total Help needed moving to and from a bed to a chair (including a wheelchair)?: Total Help needed standing up from a chair using your arms (e.g., wheelchair or bedside chair)?: Total Help needed to walk in hospital room?: Total Help needed climbing 3-5 steps with a railing? : Total 6 Click Score: 6    End of Session Equipment Utilized During Treatment: Gait belt Activity Tolerance: Patient limited by fatigue (weakness) Patient left: in chair;with call bell/phone within reach;with chair alarm set;with family/visitor present Nurse Communication: Mobility status;Need for lift equipment PT Visit Diagnosis: Unsteadiness on feet (R26.81);Difficulty in walking, not elsewhere classified (R26.2);Other symptoms  and signs involving  the nervous system (R29.898);Hemiplegia and hemiparesis Hemiplegia - Right/Left: Left Hemiplegia - dominant/non-dominant: Non-dominant Hemiplegia - caused by: Cerebral infarction    Time: 3614-4315 PT Time Calculation (min) (ACUTE ONLY): 40 min   Charges:   PT Evaluation $PT Eval Moderate Complexity: 1 Mod          Conni Slipper, PT, DPT Acute Rehabilitation Services Pager: [phone] Office: [phone]   Marylynn Pearson 01/17/2020, 1:16 PM

## 2020-01-17 NOTE — Progress Notes (Signed)
PROGRESS NOTE    HALINA ASANO   ZOX:[id number]  DOB: July 14, 1931  DOA: 01/16/2020 PCP: Jaclyn Shaggy, MD   Brief Narrative:  Delcie Roch is a 86 y.o. female with medical history significant of DM, HTN, OSA and OA. She has been followed by neurology at [facility] with last OV 02/17/19 for f/u of evaluation for change in consciousness episode. Evaluation was non-focal. MRI at that time revealed cerebral atrophy and 2 old lacuanr infarcts right cerebral hemisphere. Patient has had progressive decline and prior to the precipitating event required assistance with dressing, toileting and mobility. On 01/15/20 she had increased weakness and around 5 PM was noted to hve developed a left facial droop and slurred speech. EMS was called and patient was brought to [facility] for further evaluation.    MRI brain:  1. Acute right PCA territory infarct, predominantly involving the temporal and occipital cortex. 2. Small acute infarct of the left thalamus.  Subjective:  no complaints.     Assessment & Plan:   Principal Problem:   CVA (cerebral vascular accident) - possibly embolic - LDL 131, Cholesterol 209 - A1c 5.6 - neuro recommends a 30 day event monitor - per SLP, she should still remain NPO as she is a moderate risk for aspiratin - holding all oral meds due to this- she will need an MBS  - PT recommends SNF  Active Problems:   DM (diabetes mellitus)  - TID sliding scale    HTN (hypertension) - holding antihypertensive due to acute CVA   Dementia - on Aricept as outpt  CKD3 - stable     Time spent in minutes: 30 DVT prophylaxis: enoxaparin (LOVENOX) injection 30 mg Start: 01/16/20 1000    Code Status: full code Family Communication:  Disposition Plan:  Status is: Inpatient  Remains inpatient appropriate because:NPO, on IVF after CVA   Dispo: The patient is from: Home              Anticipated d/c is to: SNF              Anticipated d/c date is: 2  days              Patient currently is not medically stable to d/c.      Consultants:   neuro Procedures:   2 D ECHO 1. Left ventricular ejection fraction, by estimation, is 60 to 65%. The  left ventricle has normal function. The left ventricle has no regional  wall motion abnormalities. Left ventricular diastolic parameters are  consistent with Grade II diastolic  dysfunction (pseudonormalization). Elevated left ventricular end-diastolic  pressure.  2. Right ventricular systolic function is normal. The right ventricular  size is normal. There is moderately elevated pulmonary artery systolic  pressure. The estimated right ventricular systolic pressure is 60.8 mmHg.  3. Left atrial size was severely dilated.  4. The mitral valve is normal in structure. Mild mitral valve  regurgitation. Moderate mitral stenosis. The mean mitral valve gradient is  8.0 mmHg.  5. Tricuspid valve regurgitation is moderate.  6. The aortic valve is tricuspid. Aortic valve regurgitation is not  visualized. Moderate aortic valve stenosis. Aortic valve mean gradient  measures 31.0 mmHg. Aortic valve Vmax measures 3.56 m/s.  7. The inferior vena cava is normal in size with greater than 50%  respiratory variability, suggesting right atrial pressure of 3 mmHg.  Antimicrobials:  Anti-infectives (From admission, onward)   None       Objective:  Vitals:   01/17/20 0200 01/17/20 0400 01/17/20 0700 01/17/20 1242  BP: 131/60 (!) 137/57 134/62 (!) 141/95  Pulse:  72 79 74  Resp:      Temp: 99.6 F (37.6 C) 99.5 F (37.5 C) 98.9 F (37.2 C) 97.7 F (36.5 C)  TempSrc: Axillary Axillary Axillary Axillary  SpO2:  99% 98% 100%  Weight:      Height:        Intake/Output Summary (Last 24 hours) at 01/17/2020 1428 Last data filed at 01/17/2020 0200 Gross per 24 hour  Intake 150.2 ml  Output --  Net 150.2 ml   Filed Weights   01/16/20 0308  Weight: 53.8 kg    Examination: General exam: Appears  comfortable  HEENT: PERRLA, oral mucosa moist, no sclera icterus or thrush Respiratory system: Clear to auscultation. Respiratory effort normal. Cardiovascular system: S1 & S2 heard, RRR.   Gastrointestinal system: Abdomen soft, non-tender, nondistended. Normal bowel sounds. Central nervous system: Alert and oriented to person No focal neurological deficits. Extremities: No cyanosis, clubbing or edema Skin: No rashes or ulcers Psychiatry:  Mood & affect appropriate.     Data Reviewed: I have personally reviewed following labs and imaging studies  CBC: Recent Labs  Lab 01/16/20 0220 01/16/20 0225  WBC 9.6  --   NEUTROABS 7.8*  --   HGB 13.8 15.6*  HCT 45.4 46.0  MCV 97.2  --   PLT 257  --    Basic Metabolic Panel: Recent Labs  Lab 01/16/20 0220 01/16/20 0225 01/16/20 0507  NA 141 143 143  K 3.5 3.4* 3.7  CL 107 108 107  CO2 17*  --  20*  GLUCOSE 244* 221* 141*  BUN 16 18 16   CREATININE 1.50* 1.30* 1.30*  CALCIUM 10.8*  --  10.8*   GFR: Estimated Creatinine Clearance: 25 mL/min (A) (by C-G formula based on SCr of 1.3 mg/dL (H)). Liver Function Tests: Recent Labs  Lab 01/16/20 0220  AST 33  ALT 19  ALKPHOS 101  BILITOT 0.7  PROT 7.1  ALBUMIN 4.3   No results for input(s): LIPASE, AMYLASE in the last 168 hours. No results for input(s): AMMONIA in the last 168 hours. Coagulation Profile: Recent Labs  Lab 01/16/20 0220  INR 1.1   Cardiac Enzymes: No results for input(s): CKTOTAL, CKMB, CKMBINDEX, TROPONINI in the last 168 hours. BNP (last 3 results) No results for input(s): PROBNP in the last 8760 hours. HbA1C: Recent Labs    01/16/20 0507  HGBA1C 5.6   CBG: Recent Labs  Lab 01/16/20 1200 01/16/20 1702 01/16/20 2257 01/17/20 0607 01/17/20 1249  GLUCAP 93 83 96 96 80   Lipid Profile: Recent Labs    01/16/20 0500  CHOL 209*  HDL 64  LDLCALC 131*  TRIG 68  CHOLHDL 3.3   Thyroid Function Tests: No results for input(s): TSH, T4TOTAL,  FREET4, T3FREE, THYROIDAB in the last 72 hours. Anemia Panel: No results for input(s): VITAMINB12, FOLATE, FERRITIN, TIBC, IRON, RETICCTPCT in the last 72 hours. Urine analysis:    Component Value Date/Time   COLORURINE YELLOW (A) 10/18/2018 1441   APPEARANCEUR CLEAR (A) 10/18/2018 1441   APPEARANCEUR Clear 02/04/2014 2021   LABSPEC 1.012 10/18/2018 1441   LABSPEC 1.005 02/04/2014 2021   PHURINE 5.0 10/18/2018 1441   GLUCOSEU >=500 (A) 10/18/2018 1441   GLUCOSEU Negative 02/04/2014 2021   HGBUR NEGATIVE 10/18/2018 1441   BILIRUBINUR NEGATIVE 10/18/2018 1441   BILIRUBINUR Negative 02/04/2014 2021   KETONESUR NEGATIVE 10/18/2018 1441  PROTEINUR NEGATIVE 10/18/2018 1441   NITRITE NEGATIVE 10/18/2018 1441   LEUKOCYTESUR NEGATIVE 10/18/2018 1441   LEUKOCYTESUR Negative 02/04/2014 2021   Sepsis Labs: (procalcitonin:4,lacticidven:4) ) Recent Results (from the past 240 hour(s))  SARS Coronavirus 2 by RT PCR (hospital order, performed in [facility] hospital lab) Nasopharyngeal Nasopharyngeal Swab     Status: None   Collection Time: 01/16/20  5:25 AM   Specimen: Nasopharyngeal Swab  Result Value Ref Range Status   SARS Coronavirus 2 NEGATIVE NEGATIVE Final    Comment: (NOTE) SARS-CoV-2 target nucleic acids are NOT DETECTED.  The SARS-CoV-2 RNA is generally detectable in upper and lower respiratory specimens during the acute phase of infection. The lowest concentration of SARS-CoV-2 viral copies this assay can detect is 250 copies / mL. A negative result does not preclude SARS-CoV-2 infection and should not be used as the sole basis for treatment or other patient management decisions.  A negative result may occur with improper specimen collection / handling, submission of specimen other than nasopharyngeal swab, presence of viral mutation(s) within the areas targeted by this assay, and inadequate number of viral copies (<250 copies / mL). A negative result must be  combined with clinical observations, patient history, and epidemiological information.  Fact Sheet for Patients:   BoilerBrush.com.cy  Fact Sheet for Healthcare Providers: https://pope.com/  This test is not yet approved or  cleared by the [place] FDA and has been authorized for detection and/or diagnosis of SARS-CoV-2 by FDA under an Emergency Use Authorization (EUA).  This EUA will remain in effect (meaning this test can be used) for the duration of the COVID-19 declaration under Section 564(b)(1) of the Act, 21 U.S.C. section 360bbb-3(b)(1), unless the authorization is terminated or revoked sooner.  Performed at [facility], [address] [place], [place] [address]          Radiology Studies: CT Code Stroke CTA Head W/WO contrast  Addendum Date: 01/16/2020   ADDENDUM REPORT: 01/16/2020 09:13 ADDENDUM: Dr. Roda Shutters called for clarification of an arterial stenosis reference in a subsequent brain MRI report. The patient's right PCA territory infarct is associated with a severe P2 segment stenosis with flow gap. There is also a flow reducing left P1 segment stenosis which is ameliorated by a posterior communicating artery. Electronically Signed   By: Marnee Spring M.D.   On: 01/16/2020 09:13   Result Date: 01/16/2020 CLINICAL DATA:  Facial droop EXAM: CT ANGIOGRAPHY HEAD AND NECK TECHNIQUE: Multidetector CT imaging of the head and neck was performed using the standard protocol during bolus administration of intravenous contrast. Multiplanar CT image reconstructions and MIPs were obtained to evaluate the vascular anatomy. Carotid stenosis measurements (when applicable) are obtained utilizing NASCET criteria, using the distal internal carotid diameter as the denominator. CONTRAST:  75mL OMNIPAQUE IOHEXOL 350 MG/ML SOLN COMPARISON:  None. FINDINGS: CTA NECK FINDINGS SKELETON: There is no bony spinal canal stenosis. No lytic or blastic  lesion. OTHER NECK: Normal pharynx, larynx and major salivary glands. No cervical lymphadenopathy. Unremarkable thyroid gland. UPPER CHEST: No pneumothorax or pleural effusion. No nodules or masses. AORTIC ARCH: There is mild calcific atherosclerosis of the aortic arch. There is no aneurysm, dissection or hemodynamically significant stenosis of the visualized portion of the aorta. Conventional 3 vessel aortic branching pattern. The visualized proximal subclavian arteries are widely patent. RIGHT CAROTID SYSTEM: No dissection, occlusion or aneurysm. Mild atherosclerotic calcification at the carotid bifurcation without hemodynamically significant stenosis. LEFT CAROTID SYSTEM: No dissection, occlusion or aneurysm. There is  mixed density atherosclerosis extending into the proximal ICA, resulting in 50% stenosis. VERTEBRAL ARTERIES: Left dominant configuration. Both origins are clearly patent. There is no dissection, occlusion or flow-limiting stenosis to the skull base (V1-V3 segments). CTA HEAD FINDINGS POSTERIOR CIRCULATION: --Vertebral arteries: Normal V4 segments. --Inferior cerebellar arteries: Normal. --Basilar artery: Normal. --Superior cerebellar arteries: Normal. --Posterior cerebral arteries (PCA): Normal. There are bilateral posterior communicating arteries (p-comm) that partially supply the PCAs. ANTERIOR CIRCULATION: --Intracranial internal carotid arteries: Atherosclerotic calcification of the internal carotid arteries at the skull base without hemodynamically significant stenosis. --Anterior cerebral arteries (ACA): Normal. Both A1 segments are present. Patent anterior communicating artery (a-comm). --Middle cerebral arteries (MCA): Normal. VENOUS SINUSES: As permitted by contrast timing, patent. ANATOMIC VARIANTS: None Review of the MIP images confirms the above findings. IMPRESSION: 1. No intracranial arterial occlusion or high-grade stenosis. 2. Approximately 50% stenosis of the proximal left internal  carotid artery secondary to mixed density atherosclerosis. Aortic Atherosclerosis (ICD10-I70.0). Electronically Signed: By: Deatra Robinson M.D. On: 01/16/2020 03:08   DG Chest 2 View  Result Date: 01/16/2020 CLINICAL DATA:  Code stroke EXAM: CHEST - 2 VIEW COMPARISON:  10/18/2018 FINDINGS: Lungs are clear.  No pleural effusion or pneumothorax. The heart is normal in size. Degenerative changes of the visualized thoracolumbar spine. Right shoulder arthroplasty. Degenerative changes with posttraumatic deformity of the left shoulder. IMPRESSION: No evidence of acute cardiopulmonary disease. Electronically Signed   By: Charline Bills M.D.   On: 01/16/2020 04:19   CT Code Stroke CTA Neck W/WO contrast  Addendum Date: 01/16/2020   ADDENDUM REPORT: 01/16/2020 09:13 ADDENDUM: Dr. Roda Shutters called for clarification of an arterial stenosis reference in a subsequent brain MRI report. The patient's right PCA territory infarct is associated with a severe P2 segment stenosis with flow gap. There is also a flow reducing left P1 segment stenosis which is ameliorated by a posterior communicating artery. Electronically Signed   By: Marnee Spring M.D.   On: 01/16/2020 09:13   Result Date: 01/16/2020 CLINICAL DATA:  Facial droop EXAM: CT ANGIOGRAPHY HEAD AND NECK TECHNIQUE: Multidetector CT imaging of the head and neck was performed using the standard protocol during bolus administration of intravenous contrast. Multiplanar CT image reconstructions and MIPs were obtained to evaluate the vascular anatomy. Carotid stenosis measurements (when applicable) are obtained utilizing NASCET criteria, using the distal internal carotid diameter as the denominator. CONTRAST:  75mL OMNIPAQUE IOHEXOL 350 MG/ML SOLN COMPARISON:  None. FINDINGS: CTA NECK FINDINGS SKELETON: There is no bony spinal canal stenosis. No lytic or blastic lesion. OTHER NECK: Normal pharynx, larynx and major salivary glands. No cervical lymphadenopathy. Unremarkable thyroid  gland. UPPER CHEST: No pneumothorax or pleural effusion. No nodules or masses. AORTIC ARCH: There is mild calcific atherosclerosis of the aortic arch. There is no aneurysm, dissection or hemodynamically significant stenosis of the visualized portion of the aorta. Conventional 3 vessel aortic branching pattern. The visualized proximal subclavian arteries are widely patent. RIGHT CAROTID SYSTEM: No dissection, occlusion or aneurysm. Mild atherosclerotic calcification at the carotid bifurcation without hemodynamically significant stenosis. LEFT CAROTID SYSTEM: No dissection, occlusion or aneurysm. There is mixed density atherosclerosis extending into the proximal ICA, resulting in 50% stenosis. VERTEBRAL ARTERIES: Left dominant configuration. Both origins are clearly patent. There is no dissection, occlusion or flow-limiting stenosis to the skull base (V1-V3 segments). CTA HEAD FINDINGS POSTERIOR CIRCULATION: --Vertebral arteries: Normal V4 segments. --Inferior cerebellar arteries: Normal. --Basilar artery: Normal. --Superior cerebellar arteries: Normal. --Posterior cerebral arteries (PCA): Normal. There are bilateral posterior communicating arteries (  p-comm) that partially supply the PCAs. ANTERIOR CIRCULATION: --Intracranial internal carotid arteries: Atherosclerotic calcification of the internal carotid arteries at the skull base without hemodynamically significant stenosis. --Anterior cerebral arteries (ACA): Normal. Both A1 segments are present. Patent anterior communicating artery (a-comm). --Middle cerebral arteries (MCA): Normal. VENOUS SINUSES: As permitted by contrast timing, patent. ANATOMIC VARIANTS: None Review of the MIP images confirms the above findings. IMPRESSION: 1. No intracranial arterial occlusion or high-grade stenosis. 2. Approximately 50% stenosis of the proximal left internal carotid artery secondary to mixed density atherosclerosis. Aortic Atherosclerosis (ICD10-I70.0). Electronically Signed:  By: Deatra Robinson M.D. On: 01/16/2020 03:08   MR BRAIN WO CONTRAST  Result Date: 01/16/2020 CLINICAL DATA:  Stroke.  Left facial droop and slurred speech EXAM: MRI HEAD WITHOUT CONTRAST TECHNIQUE: Multiplanar, multiecho pulse sequences of the brain and surrounding structures were obtained without intravenous contrast. COMPARISON:  Head CT 01/16/2020 FINDINGS: Brain: There is abnormal diffusion restriction within the right PCA territory, predominantly involving the cortex. There is a small focus of acute ischemia in the left thalamus. There is also an acute infarct of the left-greater-than-right dorsal medulla oblongata. Diffuse confluent hyperintense T2-weighted signal within the periventricular, deep and juxtacortical white matter, most commonly due to chronic ischemic microangiopathy. There is generalized atrophy without lobar predilection. No chronic microhemorrhage. Normal midline structures. Vascular: In retrospect, the earlier CTA shows a short segment occlusion of the right P2 segment with distal reconstitution. Major flow voids are normal. Skull and upper cervical spine: Upper cervical degenerative changes. Sinuses/Orbits: Negative. Other: None. IMPRESSION: 1. Acute right PCA territory infarct, predominantly involving the temporal and occipital cortex. 2. Small acute infarct of the left thalamus. 3. Short segment occlusion of the right P2 segment with distal reconstitution demonstrated on earlier CTA. 4. Severe chronic ischemic microangiopathy. Electronically Signed   By: Deatra Robinson M.D.   On: 01/16/2020 06:49   DG Swallowing Func-Speech Pathology  Result Date: 01/17/2020 Objective Swallowing Evaluation: Type of Study: MBS-Modified Barium Swallow Study  Patient Details Name: CHANITA BODEN MRN: [id number] Date of Birth: [birth year] Today's Date: 01/17/2020 Time: SLP Start Time (ACUTE ONLY): 1150 -SLP Stop Time (ACUTE ONLY): 1214 SLP Time Calculation (min) (ACUTE ONLY): 24 min Past Medical History: Past  Medical History: Diagnosis Date . Arthritis  . Diabetes (HCC)  . Dyspnea   with exertion . GERD (gastroesophageal reflux disease)  . Hypertension  . Incontinence  . Sleep apnea   uses cpap . Swelling of both lower extremities  . Wheezing  Past Surgical History: Past Surgical History: Procedure Laterality Date . ANKLE SURGERY   . CARDIAC CATHETERIZATION    coronary stent . CARPAL TUNNEL RELEASE Right  . CATARACT EXTRACTION W/PHACO Left 07/29/2017  Procedure: CATARACT EXTRACTION PHACO AND INTRAOCULAR LENS PLACEMENT (IOC);  Surgeon: Galen Manila, MD;  Location: [facility] ORS;  Service: Ophthalmology;  Laterality: Left;  [place] 00:38.9 AP% 12.0 CDE 4.68 Fluid Pack Lot # [id number] . CATARACT EXTRACTION W/PHACO Right 08/19/2017  Procedure: CATARACT EXTRACTION PHACO AND INTRAOCULAR LENS PLACEMENT (IOC);  Surgeon: Galen Manila, MD;  Location: [facility] ORS;  Service: Ophthalmology;  Laterality: Right;  [place] 00:35 AP% 16.0 CDE 5.69 Fluid pack lot # [id number] . CESAREAN SECTION    x 2 . CORONARY ANGIOPLASTY    STENT . FOOT SURGERY Left   ankle tendon surgery . JOINT REPLACEMENT Bilateral   TKR/ RIGHT SHOULDER . KNEE SURGERY Bilateral   total knee replacements . SHOULDER SURGERY Right   shoulder replacement . tumor removed    neck HPI:  Pt is an 86 yo female presenting with AMS and L facial droop. MRI showed R lower pontine, R PCA, and L thalamic infarcts. PMH includes: DM, TIAs, GERD, HTN, OSA, wheezing, OA  Subjective: asking for food Assessment / Plan / Recommendation [facility] IP CLINICAL IMPRESSIONS 01/17/2020 Clinical Impression Pt has a moderate oropahryngeal dysphagia with oral intake quite effortful across trials. She has very limited labial seal so that she is unable to suck from a straw, but she also has trouble sealing her lips around a cup or spoon. Anterior loss is significant with thin liquids, at times only getting a trace amount of liquid in her mouth. Oral holding is more prominent with purees, but her attempts at oral transit  once initiated are sluggish and small. Her base of tongue retraction and pharyngeal squeeze are also reduced, limiting full epiglottic inversion and resulting in vallecular residue. Residue also coats her tongue, with most of the residue collecting more posteriorly at the base of her tongue. Thin liquids do help to clear some of this residue, but it takes her multiple bolus attempts until she can keep enough thin liquid in her mouth. Her airway protection is relatively intact though, with trace and transient penetration observed x1. Overall, her swallowing is not efficient to provide a consistent or adequate source of intake at this time. Would offer small, single sips of water with full supervision after oral care to try to facilitate use of swallowing musculature and provide moisture. SLP will f/u for additional therapeutic boluses with potential to initiate PO diet as efficiency improves.  SLP Visit Diagnosis Dysphagia, oropharyngeal phase (R13.12) Attention and concentration deficit following -- Frontal lobe and executive function deficit following -- Impact on safety and function Moderate aspiration risk   [facility] IP TREATMENT RECOMMENDATION 01/17/2020 Treatment Recommendations Therapy as outlined in treatment plan below   Prognosis 01/17/2020 Prognosis for Safe Diet Advancement Good Barriers to Reach Goals Cognitive deficits Barriers/Prognosis Comment -- [facility] IP DIET RECOMMENDATION 01/17/2020 SLP Diet Recommendations NPO;Other (Comment) Liquid Administration via Spoon;Cup Medication Administration Via alternative means Compensations -- Postural Changes --   [facility] IP OTHER RECOMMENDATIONS 01/17/2020 Recommended Consults -- Oral Care Recommendations Oral care QID Other Recommendations Have oral suction available   [facility] IP FOLLOW UP RECOMMENDATIONS 01/17/2020 Follow up Recommendations Skilled Nursing facility   [facility] IP FREQUENCY AND DURATION 01/17/2020 Speech Therapy Frequency (ACUTE ONLY) min 2x/week Treatment Duration 2 weeks       [facility] IP ORAL PHASE 01/17/2020 Oral Phase Impaired Oral - Pudding Teaspoon -- Oral - Pudding Cup -- Oral - Honey Teaspoon -- Oral - Honey Cup -- Oral - Nectar Teaspoon -- Oral - Nectar Cup -- Oral - Nectar Straw -- Oral - Thin Teaspoon Weak lingual manipulation;Lingual pumping;Reduced posterior propulsion;Holding of bolus;Lingual/palatal residue;Delayed oral transit;Decreased bolus cohesion;Premature spillage;Left anterior bolus loss Oral - Thin Cup Weak lingual manipulation;Lingual pumping;Reduced posterior propulsion;Holding of bolus;Lingual/palatal residue;Delayed oral transit;Decreased bolus cohesion;Premature spillage;Left anterior bolus loss Oral - Thin Straw -- Oral - Puree Weak lingual manipulation;Lingual pumping;Reduced posterior propulsion;Holding of bolus;Lingual/palatal residue;Delayed oral transit;Decreased bolus cohesion;Premature spillage Oral - Mech Soft -- Oral - Regular -- Oral - Multi-Consistency -- Oral - Pill -- Oral Phase - Comment --  CH[facility] IP PHARYNGEAL PHASE 01/17/2020 Pharyngeal Phase Impaired Pharyngeal- Pudding Teaspoon -- Pharyngeal -- Pharyngeal- Pudding Cup -- Pharyngeal -- Pharyngeal- Honey Teaspoon -- Pharyngeal -- Pharyngeal- Honey Cup -- Pharyngeal -- Pharyngeal- Nectar Teaspoon -- Pharyngeal -- Pharyngeal- Nectar Cup -- Pharyngeal -- Pharyngeal- Nectar Straw -- Pharyngeal -- Pharyngeal-  Thin Teaspoon Reduced tongue base retraction;Reduced pharyngeal peristalsis;Pharyngeal residue - valleculae;Reduced epiglottic inversion Pharyngeal -- Pharyngeal- Thin Cup Reduced tongue base retraction;Reduced pharyngeal peristalsis;Pharyngeal residue - valleculae;Penetration/Aspiration before swallow;Reduced epiglottic inversion Pharyngeal Material enters airway, remains ABOVE vocal cords then ejected out Pharyngeal- Thin Straw -- Pharyngeal -- Pharyngeal- Puree Reduced tongue base retraction;Reduced pharyngeal peristalsis;Pharyngeal residue - valleculae;Reduced epiglottic inversion;Delayed swallow  initiation-vallecula Pharyngeal -- Pharyngeal- Mechanical Soft -- Pharyngeal -- Pharyngeal- Regular -- Pharyngeal -- Pharyngeal- Multi-consistency -- Pharyngeal -- Pharyngeal- Pill -- Pharyngeal -- Pharyngeal Comment --  CHL IP CERVICAL ESOPHAGEAL PHASE 01/17/2020 Cervical Esophageal Phase WFL Pudding Teaspoon -- Pudding Cup -- Honey Teaspoon -- Honey Cup -- Nectar Teaspoon -- Nectar Cup -- Nectar Straw -- Thin Teaspoon -- Thin Cup -- Thin Straw -- Puree -- Mechanical Soft -- Regular -- Multi-consistency -- Pill -- Cervical Esophageal Comment -- Mahala Menghini., M.A. CCC-SLP Acute Rehabilitation Services Pager [phone] Office [phone] 01/17/2020, 1:46 PM              ECHOCARDIOGRAM COMPLETE  Result Date: 01/17/2020    ECHOCARDIOGRAM REPORT   Patient Name:   KARIME SCHEUERMANN Date of Exam: 01/17/2020 Medical Rec #:  [id number]       Height:       62.0 in Accession #:    [id number]      Weight:       118.6 lb Date of Birth:  [birth year]      BSA:          1.531 m Patient Age:    87 years        BP:           134/62 mmHg Patient Gender: F               HR:           72 bpm. Exam Location:  Inpatient Procedure: 2D Echo Indications:    Stroke I163.9  History:        Patient has prior history of Echocardiogram examinations. Risk                 Factors:Hypertension and Diabetes.  Sonographer:    Thurman Coyer RDCS (AE) Referring Phys: [id number] MICHAEL E NORINS IMPRESSIONS  1. Left ventricular ejection fraction, by estimation, is 60 to 65%. The left ventricle has normal function. The left ventricle has no regional wall motion abnormalities. Left ventricular diastolic parameters are consistent with Grade II diastolic dysfunction (pseudonormalization). Elevated left ventricular end-diastolic pressure.  2. Right ventricular systolic function is normal. The right ventricular size is normal. There is moderately elevated pulmonary artery systolic pressure. The estimated right ventricular systolic pressure is 60.8 mmHg.  3. Left  atrial size was severely dilated.  4. The mitral valve is normal in structure. Mild mitral valve regurgitation. Moderate mitral stenosis. The mean mitral valve gradient is 8.0 mmHg.  5. Tricuspid valve regurgitation is moderate.  6. The aortic valve is tricuspid. Aortic valve regurgitation is not visualized. Moderate aortic valve stenosis. Aortic valve mean gradient measures 31.0 mmHg. Aortic valve Vmax measures 3.56 m/s.  7. The inferior vena cava is normal in size with greater than 50% respiratory variability, suggesting right atrial pressure of 3 mmHg. FINDINGS  Left Ventricle: Left ventricular ejection fraction, by estimation, is 60 to 65%. The left ventricle has normal function. The left ventricle has no regional wall motion abnormalities. The left ventricular internal cavity size was normal in size. There is  no left ventricular hypertrophy. Left ventricular diastolic parameters are  consistent with Grade II diastolic dysfunction (pseudonormalization). Elevated left ventricular end-diastolic pressure. Right Ventricle: The right ventricular size is normal. No increase in right ventricular wall thickness. Right ventricular systolic function is normal. There is moderately elevated pulmonary artery systolic pressure. The tricuspid regurgitant velocity is 3.80 m/s, and with an assumed right atrial pressure of 3 mmHg, the estimated right ventricular systolic pressure is 60.8 mmHg. Left Atrium: Left atrial size was severely dilated. Right Atrium: Right atrial size was normal in size. Pericardium: There is no evidence of pericardial effusion. Mitral Valve: The mitral valve is normal in structure. There is moderate thickening of the mitral valve leaflet(s). Normal mobility of the mitral valve leaflets. Severe mitral annular calcification. Mild mitral valve regurgitation. Moderate mitral valve stenosis. MV peak gradient, 17.1 mmHg. The mean mitral valve gradient is 8.0 mmHg. Tricuspid Valve: The tricuspid valve is normal  in structure. Tricuspid valve regurgitation is moderate . No evidence of tricuspid stenosis. Aortic Valve: The aortic valve is tricuspid. . There is severe thickening of the aortic valve. Aortic valve regurgitation is not visualized. Moderate aortic stenosis is present. Severe aortic valve annular calcification. There is severe thickening of the  aortic valve. Aortic valve mean gradient measures 31.0 mmHg. Aortic valve peak gradient measures 50.7 mmHg. Aortic valve area, by VTI measures 0.69 cm. Pulmonic Valve: The pulmonic valve was normal in structure. Pulmonic valve regurgitation is not visualized. No evidence of pulmonic stenosis. Aorta: The aortic root is normal in size and structure. Venous: The inferior vena cava is normal in size with greater than 50% respiratory variability, suggesting right atrial pressure of 3 mmHg. IAS/Shunts: No atrial level shunt detected by color flow Doppler.  LEFT VENTRICLE PLAX 2D LVIDd:         3.90 cm  Diastology LVIDs:         2.30 cm  LV e' lateral:   4.26 cm/s LV PW:         0.90 cm  LV E/e' lateral: 44.1 LV IVS:        1.00 cm  LV e' medial:    3.30 cm/s LVOT diam:     1.80 cm  LV E/e' medial:  57.0 LV SV:         58 LV SV Index:   38 LVOT Area:     2.54 cm  RIGHT VENTRICLE RV S prime:     12.30 cm/s TAPSE (M-mode): 2.3 cm LEFT ATRIUM             Index       RIGHT ATRIUM           Index LA diam:        3.70 cm 2.42 cm/m  RA Area:     11.80 cm LA Vol (A2C):   82.4 ml 53.82 ml/m RA Volume:   22.40 ml  14.63 ml/m LA Vol (A4C):   75.0 ml 48.99 ml/m LA Biplane Vol: 78.7 ml 51.40 ml/m  AORTIC VALVE AV Area (Vmax):    0.61 cm AV Area (Vmean):   0.54 cm AV Area (VTI):     0.69 cm AV Vmax:           356.00 cm/s AV Vmean:          263.000 cm/s AV VTI:            0.839 m AV Peak Grad:      50.7 mmHg AV Mean Grad:      31.0 mmHg LVOT Vmax:  85.00 cm/s LVOT Vmean:        55.300 cm/s LVOT VTI:          0.229 m LVOT/AV VTI ratio: 0.27  AORTA Ao Root diam: 2.90 cm MITRAL  VALVE                TRICUSPID VALVE MV Area (PHT): 2.39 cm     TR Peak grad:   57.8 mmHg MV Peak grad:  17.1 mmHg    TR Vmax:        380.00 cm/s MV Mean grad:  8.0 mmHg MV Vmax:       2.07 m/s     SHUNTS MV Vmean:      130.0 cm/s   Systemic VTI:  0.23 m MV Decel Time: 317 msec     Systemic Diam: 1.80 cm MV E velocity: 188.00 cm/s MV A velocity: 126.00 cm/s MV E/A ratio:  1.49 Armanda Magic MD Electronically signed by Armanda Magic MD Signature Date/Time: 01/17/2020/12:06:26 PM    Final    CT HEAD CODE STROKE WO CONTRAST  Result Date: 01/16/2020 CLINICAL DATA:  Code stroke.  Facial droop EXAM: CT HEAD WITHOUT CONTRAST TECHNIQUE: Contiguous axial images were obtained from the base of the skull through the vertex without intravenous contrast. COMPARISON:  10/18/2018 FINDINGS: Brain: There is no mass, hemorrhage or extra-axial collection. There is generalized atrophy without lobar predilection. There is hypoattenuation of the periventricular white matter, most commonly indicating chronic ischemic microangiopathy. Vascular: Atherosclerotic calcification of the internal carotid arteries at the skull base. No abnormal hyperdensity of the major intracranial arteries or dural venous sinuses. Skull: The visualized skull base, calvarium and extracranial soft tissues are normal. Sinuses/Orbits: No fluid levels or advanced mucosal thickening of the visualized paranasal sinuses. No mastoid or middle ear effusion. The orbits are normal. ASPECTS [facility] Stroke Program Early CT Score) - Ganglionic level infarction (caudate, lentiform nuclei, internal capsule, insula, M1-M3 cortex): 7 - Supraganglionic infarction (M4-M6 cortex): 3 Total score (0-10 with 10 being normal): 10 IMPRESSION: 1. No acute intracranial abnormality. 2. ASPECTS is 10. These results were communicated to Dr. Ritta Slot at 2:35 am on 01/16/2020 by text page via the [facility] messaging system. Electronically Signed   By: Deatra Robinson M.D.   On: 01/16/2020  02:35   VAS [place] LOWER EXTREMITY VENOUS (DVT)  Result Date: 01/17/2020  Lower Venous DVTStudy Indications: Stroke.  Limitations: Altered mental status/poor cooperation. Comparison Study: Prior negative study done 11/23/16 is available for comparison Performing Technologist: Sherren Kerns RVS  Examination Guidelines: A complete evaluation includes B-mode imaging, spectral Doppler, color Doppler, and power Doppler as needed of all accessible portions of each vessel. Bilateral testing is considered an integral part of a complete examination. Limited examinations for reoccurring indications may be performed as noted. The reflux portion of the exam is performed with the patient in reverse Trendelenburg.  +---------+---------------+---------+-----------+----------+--------------+ RIGHT    CompressibilityPhasicitySpontaneityPropertiesThrombus Aging +---------+---------------+---------+-----------+----------+--------------+ CFV      Full           Yes      Yes                                 +---------+---------------+---------+-----------+----------+--------------+ SFJ      Full                                                        +---------+---------------+---------+-----------+----------+--------------+  FV Prox  Full                                                        +---------+---------------+---------+-----------+----------+--------------+ FV Mid   Full                                                        +---------+---------------+---------+-----------+----------+--------------+ FV DistalFull                                                        +---------+---------------+---------+-----------+----------+--------------+ PFV      Full                                                        +---------+---------------+---------+-----------+----------+--------------+ POP      Full           Yes      Yes                                  +---------+---------------+---------+-----------+----------+--------------+ PTV      Full                                                        +---------+---------------+---------+-----------+----------+--------------+ PERO     Full                                                        +---------+---------------+---------+-----------+----------+--------------+   +---------+---------------+---------+-----------+----------+-------------------+ LEFT     CompressibilityPhasicitySpontaneityPropertiesThrombus Aging      +---------+---------------+---------+-----------+----------+-------------------+ CFV      Full           Yes      Yes                                      +---------+---------------+---------+-----------+----------+-------------------+ SFJ      Full                                                             +---------+---------------+---------+-----------+----------+-------------------+ FV Prox  Full                                                             +---------+---------------+---------+-----------+----------+-------------------+  FV Mid   Full                                                             +---------+---------------+---------+-----------+----------+-------------------+ FV DistalFull                                                             +---------+---------------+---------+-----------+----------+-------------------+ PFV      Full                                                             +---------+---------------+---------+-----------+----------+-------------------+ POP                                                   patent by color and                                                       Doppler             +---------+---------------+---------+-----------+----------+-------------------+ PTV      Full                                                              +---------+---------------+---------+-----------+----------+-------------------+ PERO     Full                                                             +---------+---------------+---------+-----------+----------+-------------------+     Summary: RIGHT: - Findings appear essentially unchanged compared to previous examination. - There is no evidence of deep vein thrombosis in the lower extremity.  LEFT: - Findings appear essentially unchanged compared to previous examination. - There is no evidence of deep vein thrombosis in the lower extremity. However, portions of this examination were limited- see technologist comments above.  *See table(s) above for measurements and observations.    Preliminary       Scheduled Meds: .  stroke: mapping our early stages of recovery book   Does not apply Once  . acetaminophen  650 mg Oral BID  . aspirin  300 mg Rectal Daily  . clopidogrel  75 mg Oral QPM  . colestipol  2 g Oral QPM  . enoxaparin (LOVENOX) injection  30 mg Subcutaneous Q24H  . simvastatin  40 mg Oral q1800   And  . ezetimibe  10 mg Oral q1800  . insulin aspart  0-15 Units Subcutaneous TID WC  . oxybutynin  5 mg Oral BID  . pantoprazole  40 mg Oral Daily   Continuous Infusions: . sodium chloride 50 mL/hr at 01/17/20 0200     LOS: 1 day      Calvert Cantor, MD [facility] Pager: [email or web address] 01/17/2020, 2:28 PM

## 2020-01-18 DIAGNOSIS — R1312 Dysphagia, oropharyngeal phase: Secondary | ICD-10-CM

## 2020-01-18 DIAGNOSIS — E162 Hypoglycemia, unspecified: Secondary | ICD-10-CM

## 2020-01-18 DIAGNOSIS — E860 Dehydration: Secondary | ICD-10-CM

## 2020-01-18 LAB — GLUCOSE, CAPILLARY
Glucose-Capillary: 74 mg/dL (ref 70–99)
Glucose-Capillary: 59 mg/dL — ABNORMAL LOW (ref 70–99)
Glucose-Capillary: 62 mg/dL — ABNORMAL LOW (ref 70–99)
Glucose-Capillary: 64 mg/dL — ABNORMAL LOW (ref 70–99)
Glucose-Capillary: 76 mg/dL (ref 70–99)
Glucose-Capillary: 84 mg/dL (ref 70–99)
Glucose-Capillary: 94 mg/dL (ref 70–99)

## 2020-01-18 MED ORDER — DEXTROSE 50 % IV SOLN
INTRAVENOUS | Status: AC
  Administered 2020-01-18: 25 mL
  Filled 2020-01-18: qty 50

## 2020-01-18 MED ORDER — DEXTROSE-NACL 5-0.9 % IV SOLN: INTRAVENOUS | Status: DC

## 2020-01-18 MED ORDER — HYDRALAZINE HCL 20 MG/ML IJ SOLN: 10.0000 mg | Freq: Four times a day (QID) | INTRAMUSCULAR | Status: DC | PRN

## 2020-01-18 NOTE — TOC Initial Note (Signed)
Transition of Care [facility]) - Initial/Assessment Note    Patient Details  Name: Sydney Schultz MRN: [id number] Date of Birth: 1931-07-22  [facility]) CM/SW Contact:    Geralynn Ochs, LCSW Phone Number: 01/18/2020, 9:14 AM  Clinical Narrative:     CSW met with daughter, Mickel Baas, at bedside to discuss recommendation for SNF. Mickel Baas not overly interested in SNF, especially since patient has not been vaccinated and will need to quarantine, but will talk with her sister and get back to CSW with decision tomorrow. CSW discussed with Mickel Baas about options to take the patient home instead and possible equipment needs. CSW provided CMS list to Mickel Baas, completed referral and faxed out for consideration of SNF. CSW to follow.              Expected Discharge Plan: Skilled Nursing Facility Barriers to Discharge: Insurance Authorization, Continued Medical Work up   Patient Goals and CMS Choice Patient states their goals for this hospitalization and ongoing recovery are:: patient unable to participate in goal setting due to disorientation CMS Medicare.gov Compare Post Acute Care list provided to:: Patient Represenative (must comment) Choice offered to / list presented to : Adult Children  Expected Discharge Plan and Services Expected Discharge Plan: [place] arrangements for the past 2 months: Single Family Home                                      Prior Living Arrangements/Services Living arrangements for the past 2 months: Single Family Home Lives with:: Adult Children, Spouse Patient language and need for interpreter reviewed:: No Do you feel safe going back to the place where you live?: Yes      Need for Family Participation in Patient Care: Yes (Comment) Care giver support system in place?: Yes (comment) Current home services: DME Criminal Activity/Legal Involvement Pertinent to Current Situation/Hospitalization: No - Comment as  needed  Activities of Daily Living      Permission Sought/Granted Permission sought to share information with : Facility Sport and exercise psychologist, Family Supports Permission granted to share information with : Yes, Verbal Permission Granted  Share Information with NAME: Marcial Pacas  Permission granted to share info w AGENCY: SNF  Permission granted to share info w Relationship: Daughters     Emotional Assessment Appearance:: Appears stated age Attitude/Demeanor/Rapport: Unable to Assess Affect (typically observed): Unable to Assess Orientation: : Oriented to Self Alcohol / Substance Use: Not Applicable Psych Involvement: No (comment)  Admission diagnosis:  Dehydration [E86.0] Hyperglycemia [R73.9] CVA (cerebral vascular accident) ([place]) [I63.9] Stroke [facility]) [I63.9] Cerebrovascular accident (CVA), unspecified mechanism ([place]) [I63.9] Patient Active Problem List   Diagnosis Date Noted   CVA (cerebral vascular accident) ([place]) 01/16/2020   DM (diabetes mellitus) ([place]) 01/16/2020   HTN (hypertension) 01/16/2020   GERD (gastroesophageal reflux disease) 01/16/2020   OSA (obstructive sleep apnea) 01/16/2020   PCP:  Albina Billet, MD Pharmacy:   [id number] [address], [place] [address] Phone: [phone] Fax: [phone]     Social Determinants of Health (SDOH) Interventions    Readmission Risk Interventions No flowsheet data found.

## 2020-01-18 NOTE — TOC Transition Note (Deleted)
Transition of Care [facility]) - CM/SW Discharge Note   Patient Details  Name: Sydney Schultz MRN: [id number] Date of Birth: 03-31-1932  Transition of Care [facility]) CM/SW Contact:  Pollie Friar, RN Phone Number: 01/18/2020, 11:59 AM   Clinical Narrative:    CM met with the patient and her daughter, Sydney Schultz. Sydney Schultz is feeling that home is the best option currently. CM will follow for d/c needs closer to pt being ready for d/c.      Barriers to Discharge: Insurance Authorization, Continued Medical Work up   Patient Goals and CMS Choice Patient states their goals for this hospitalization and ongoing recovery are:: patient unable to participate in goal setting due to disorientation CMS Medicare.gov Compare Post Acute Care list provided to:: Patient Represenative (must comment) Choice offered to / list presented to : Adult Children  Discharge Placement                       Discharge Plan and Services                                     Social Determinants of Health (SDOH) Interventions     Readmission Risk Interventions No flowsheet data found.

## 2020-01-18 NOTE — Evaluation (Signed)
Speech Language Pathology Evaluation Patient Details Name: Sydney Schultz MRN: [id number] DOB: 13-Apr-1932 Today's Date: 01/18/2020 Time: 3875-6433 SLP Time Calculation (min) (ACUTE ONLY): 30 min  Problem List:  Patient Active Problem List   Diagnosis Date Noted  . CVA (cerebral vascular accident) (HCC) 01/16/2020  . DM (diabetes mellitus) (HCC) 01/16/2020  . HTN (hypertension) 01/16/2020  . GERD (gastroesophageal reflux disease) 01/16/2020  . OSA (obstructive sleep apnea) 01/16/2020   Past Medical History:  Past Medical History:  Diagnosis Date  . Arthritis   . Diabetes (HCC)   . Dyspnea    with exertion  . GERD (gastroesophageal reflux disease)   . Hypertension   . Incontinence   . Sleep apnea    uses cpap  . Swelling of both lower extremities   . Wheezing    Past Surgical History:  Past Surgical History:  Procedure Laterality Date  . ANKLE SURGERY    . CARDIAC CATHETERIZATION     coronary stent  . CARPAL TUNNEL RELEASE Right   . CATARACT EXTRACTION W/PHACO Left 07/29/2017   Procedure: CATARACT EXTRACTION PHACO AND INTRAOCULAR LENS PLACEMENT (IOC);  Surgeon: Galen Manila, MD;  Location: [facility] ORS;  Service: Ophthalmology;  Laterality: Left;  [place] 00:38.9 AP% 12.0 CDE 4.68 Fluid Pack Lot # [id number]  . CATARACT EXTRACTION W/PHACO Right 08/19/2017   Procedure: CATARACT EXTRACTION PHACO AND INTRAOCULAR LENS PLACEMENT (IOC);  Surgeon: Galen Manila, MD;  Location: [facility] ORS;  Service: Ophthalmology;  Laterality: Right;  [place] 00:35 AP% 16.0 CDE 5.69 Fluid pack lot # [id number]  . CESAREAN SECTION     x 2  . CORONARY ANGIOPLASTY     STENT  . FOOT SURGERY Left    ankle tendon surgery  . JOINT REPLACEMENT Bilateral    TKR/ RIGHT SHOULDER  . KNEE SURGERY Bilateral    total knee replacements  . SHOULDER SURGERY Right    shoulder replacement  . tumor removed     neck   HPI:  Pt is an 86 yo female presenting with AMS and L facial droop. MRI showed R lower pontine, R  PCA, and L thalamic infarcts. PMH includes: DM, TIAs, GERD, HTN, OSA, wheezing, OA   Assessment / Plan / Recommendation Clinical Impression  Pt's daughter, Vernona Rieger, describes progressive cognitive decline at home that is more chronic in nature, mixed with a more acute decline in function during this hospital stay. She is oriented to person only and can increase orientation to year with binary choices. Despite cueing she does not show orientation to location or situation. Deficits are also noted with basic problem solving, simple command following, and intellectual awareness. Word finding errors are noted but not an acute change per Vernona Rieger. Her speech however is slurred, although mostly intelligible at the sentence level, and Vernona Rieger says that this is more acute. Recommend ongoing SLP f/u to maximize safety and family education/training.     SLP Assessment  SLP Recommendation/Assessment: Patient needs continued Speech Lanaguage Pathology Services SLP Visit Diagnosis: Dysarthria and anarthria (R47.1);Cognitive communication deficit (R41.841)    Follow Up Recommendations  Skilled Nursing facility    Frequency and Duration min 2x/week  2 weeks      SLP Evaluation Cognition  Overall Cognitive Status: Impaired/Different from baseline Arousal/Alertness: Awake/alert Orientation Level: Oriented to person;Disoriented to time;Disoriented to place;Disoriented to situation Attention: Sustained Sustained Attention: Impaired Sustained Attention Impairment: Verbal basic Memory: Impaired Memory Impairment: Decreased recall of new information Awareness: Impaired Awareness Impairment: Intellectual impairment Problem Solving: Impaired Problem Solving Impairment: Verbal  basic Safety/Judgment: Impaired       Comprehension  Auditory Comprehension Overall Auditory Comprehension: Impaired Yes/No Questions: Impaired Commands: Impaired One Step Basic Commands: 50-74% accurate Conversation: Simple     Expression Expression Primary Mode of Expression: Verbal Verbal Expression Overall Verbal Expression: Impaired at baseline (owrd finding errors)   Oral / Motor  Motor Speech Overall Motor Speech: Impaired Respiration: Within functional limits Phonation: Normal Resonance: Within functional limits Articulation: Impaired Level of Impairment: Conversation Intelligibility: Intelligibility reduced Conversation: 75-100% accurate   GO                    Mahala Menghini., M.A. CCC-SLP Acute Rehabilitation Services Pager [phone] Office [phone]  01/18/2020, 4:40 PM

## 2020-01-18 NOTE — Progress Notes (Signed)
STROKE TEAM PROGRESS NOTE   INTERVAL HISTORY Daughter at bedside. Pt lying in bed, initially sleeping however, easily arousable. Pleasant and able to follow limited simple commands but not orientated due to underlying dementia. Had MBS yesterday but did not pass swallow. On IVF. However, had hypoglycemic event last night, was given D50. This morning glucose still lowish.   OBJECTIVE Vitals:   01/18/20 0423 01/18/20 0500 01/18/20 0600 01/18/20 0811  BP:    (!) 169/63  Pulse: 71   64  Resp: 15 17 18 20   Temp: 97.6 F (36.4 C)   97.8 F (36.6 C)  TempSrc: Axillary   Oral  SpO2: 100%   98%  Weight:      Height:       CBC:  Recent Labs  Lab 01/16/20 0220 01/16/20 0225  WBC 9.6  --   NEUTROABS 7.8*  --   HGB 13.8 15.6*  HCT 45.4 46.0  MCV 97.2  --   PLT 257  --    Basic Metabolic Panel:  Recent Labs  Lab 01/16/20 0220 01/16/20 0220 01/16/20 0225 01/16/20 0507  NA 141   < > 143 143  K 3.5   < > 3.4* 3.7  CL 107   < > 108 107  CO2 17*  --   --  20*  GLUCOSE 244*   < > 221* 141*  BUN 16   < > 18 16  CREATININE 1.50*   < > 1.30* 1.30*  CALCIUM 10.8*  --   --  10.8*   < > = values in this interval not displayed.   Lipid Panel:     Component Value Date/Time   CHOL 209 (H) 01/16/2020 0500   TRIG 68 01/16/2020 0500   HDL 64 01/16/2020 0500   CHOLHDL 3.3 01/16/2020 0500   VLDL 14 01/16/2020 0500   LDLCALC 131 (H) 01/16/2020 0500   HgbA1c:  Lab Results  Component Value Date   HGBA1C 5.6 01/16/2020   Urine Drug Screen: No results found for: LABOPIA, COCAINSCRNUR, LABBENZ, AMPHETMU, THCU, LABBARB  Alcohol Level No results found for: ETH  IMAGING  CT Code Stroke CTA Head W/WO contrast CT Code Stroke CTA Neck W/WO contrast 01/16/2020   ADDENDUM: Dr. 03/17/2020 called for clarification of an arterial stenosis reference in a subsequent brain MRI report. The patient's right PCA territory infarct is associated with a severe P2 segment stenosis with flow gap. There is also a flow  reducing left P1 segment stenosis which is ameliorated by a posterior communicating artery.  IMPRESSION:  1. No intracranial arterial occlusion or high-grade stenosis.  2. Approximately 50% stenosis of the proximal left internal carotid artery secondary to mixed density atherosclerosis.  Aortic Atherosclerosis (ICD10-I70.0).   MR BRAIN WO CONTRAST 01/16/2020 MPRESSION:  1. Acute right PCA territory infarct, predominantly involving the temporal and occipital cortex.  2. Small acute infarct of the left thalamus.  3. Short segment occlusion of the right P2 segment with distal reconstitution demonstrated on earlier CTA.  4. Severe chronic ischemic microangiopathy.   CT HEAD CODE STROKE WO CONTRAST 01/16/2020 MPRESSION:  1. No acute intracranial abnormality.  2. ASPECTS is 10.   DG Chest 2 View 01/16/2020 IMPRESSION:  No evidence of acute cardiopulmonary disease.   Transthoracic Echocardiogram  1. Left ventricular ejection fraction, by estimation, is 60 to 65%. The left ventricle has normal function. The left ventricle has no regional wall motion abnormalities. Left ventricular diastolic parameters are consistent with Grade II diastolic dysfunction (pseudonormalization).  Elevated left ventricular end-diastolic pressure.  2. Right ventricular systolic function is normal. The right ventricular size is normal. There is moderately elevated pulmonary artery systolic pressure. The estimated right ventricular systolic pressure is 60.8 mmHg.  3. Left atrial size was severely dilated.  4. The mitral valve is normal in structure. Mild mitral valve regurgitation. Moderate mitral stenosis. The mean mitral valve gradient is 8.0 mmHg.  5. Tricuspid valve regurgitation is moderate.  6. The aortic valve is tricuspid. Aortic valve regurgitation is not visualized. Moderate aortic valve stenosis. Aortic valve mean gradient measures 31.0 mmHg. Aortic valve Vmax measures 3.56 m/s.  7. The inferior vena cava is  normal in size with greater than 50% respiratory variability, suggesting right atrial pressure of 3 mmHg.   Bilateral Lower Extremity Venous Dopplers   RIGHT:  - Findings appear essentially unchanged compared to previous examination.  - There is no evidence of deep vein thrombosis in the lower extremity.   LEFT:  - Findings appear essentially unchanged compared to previous examination.  - There is no evidence of deep vein thrombosis in the lower extremity.   ECG - SR rate 83 BPM. (See cardiology reading for complete details)  PHYSICAL EXAM    Temp:  [97.6 F (36.4 C)-98.4 F (36.9 C)] 97.8 F (36.6 C) (09/07 0811) Pulse Rate:  [64-80] 64 (09/07 0811) Resp:  [15-20] 20 (09/07 0811) BP: (141-180)/(61-95) 169/63 (09/07 0811) SpO2:  [97 %-100 %] 98 % (09/07 0811)  General - Well nourished, well developed, in no apparent distress, sleepy.  Ophthalmologic - fundi not visualized due to noncooperation.  Cardiovascular - Regular rhythm and rate.  Neuro - sleepy but easily arousable, eyes open, orientated to self and people but not to time or place.  Mild dysarthria, paucity of speech, limited answers for questions, but able to speak short sentences spontaneously, not cooperative with naming or repetition.  Bilateral gaze nystagmus, bilateral directions. Disconjugate eyes, with left eye upward deviation. PERRL. Not consistently blinking to visual threat to the left.  Left complete facial palsy with decreased left frontal ring toes, left watery eye, decreased left eye blinking, significanct left facial droop, however left palpebral fissure similar to or even smaller than the right.  Tongue midline.  Bilateral upper extremity 4/5, bilateral lower extremity 2/5 proximal and 3/5 distal PF/DF.  Sensation subjectively symmetrical, but coordination not corporative.  Gait not tested.   ASSESSMENT/PLAN Sydney Schultz is a 86 y.o. female with history of  "episodes" which previously been attributed  to diabetes, TIAs, "sleep attacks"DM, HTN, CAD (s/p stent) and OSA, presents with confusion and shaking. She did not receive IV t-PA due to late presentation (>4.5 hours from time of onset).  Stroke: acute right PCA, right lower pontine, left thalamic infarcts - embolic pattern, source unclear.  Code Stroke CT Head -  No acute intracranial abnormality. ASPECTS is 10.     CTA H&N - severe P2 segment stenosis with flow gap. There is also a flow reducing left P1 segment stenosis which is ameliorated by a posterior communicating artery.  MRI head - Acute right PCA territory infarct, predominantly involving the temporal and occipital cortex. Small acute infarct of the left thalamus. Also left lower pontine infarct.    LE Venous Dopplers - neg DVT  2D Echo - EF 60-56%. No source of embolus. LA severely dilated.   Recommend 30-day cardiac event monitoring as outpatient to rule out A. fib  Sars Corona Virus 2 - negative  LDL - 131  HgbA1c - 5.6  VTE prophylaxis - Lovenox  clopidogrel 75 mg daily prior to admission, now on aspirin 300 supp due to NPO status.  Therapy recommendations:  SNF, but family would like to take her home  Disposition:  Pending  Episodes of altered consciousness  Per daughter, she has been having the unresponsive episodes for some time, lasting hours and resolved. She called it "diabetic episodes", but did not check glucose during the episodes.   Follows with Dr. Malvin Johns and NP Sydney Schultz at [facility]  Etiology unclear, DDx including vertebrobasilar insufficiency, Lewy body dementia, seizure, syncope, hypoglycemia episode  Continue to follow up with Dr. Malvin Johns and NP Sydney Schultz  Hypertension  Home BP meds: irbesartan ; HCTZ  Current BP meds: none   Stable  . Long-term BP goal normotensive  Hyperlipidemia  Home Lipid lowering medication: none (Vytorin in the past)  LDL 131, goal < 70  Consider statin once po access  Continue statin at  discharge  Diabetes  Home diabetic meds: Jardiance  Current diabetic meds: SSI   HgbA1c 5.6, goal < 7.0  CBG monitoring  PCP follow up  Dysphagia . Secondary to stroke . Failed swallow evaluation with MBS . NPO  . Speech on board . consider tube feeding once able  Hypoglycemia  Glucose 63->117->74->62  Received D50  NS changed to D5NS  Consider TF once able  Other Stroke Risk Factors  Advanced age  Coronary artery disease  Obstructive sleep apnea, on CPAP at home  Other Active Problems  Code status - DNR  Dementia - resume Aricept once po access CKD - stage  3b - creatinine - 1.50->1.30->1.30-> pending GERD  Hospital day # 2   Marvel Plan, MD PhD Stroke Neurology 01/18/2020 12:20 PM  To contact Stroke Continuity provider, please refer to [email or web address]. After hours, contact General Neurology

## 2020-01-18 NOTE — Progress Notes (Signed)
  Speech Language Pathology Treatment: Dysphagia  Patient Details Name: Sydney Schultz MRN: [id number] DOB: Apr 05, 1932 Today's Date: 01/18/2020 Time: 1829-9371 SLP Time Calculation (min) (ACUTE ONLY): 30 min  Assessment / Plan / Recommendation Clinical Impression  Pt has less anterior bolus loss today, noting a mild amount with thin liquids via spoon. Her oral manipulation and transit is occurring more spontaneously with purees and without overt signs of aspiration. She does have strong, prolonged coughing with thin liquids via straw despite SLP assisting with controlling bolus size. She still does not appear to be ready for a PO diet, but would continue to provide small spoonfuls of water. Could also introduce small bites of puree to provide medications orally. Will f/u for potential to initiate diet, although per discussion with daughter, pt would also have to demonstrate her ability to obtain an adequate amount nutrition and hydration.    HPI HPI: Pt is an 86 yo female presenting with AMS and L facial droop. MRI showed R lower pontine, R PCA, and L thalamic infarcts. PMH includes: DM, TIAs, GERD, HTN, OSA, wheezing, OA      SLP Plan  Continue with current plan of care       Recommendations  Diet recommendations: Other(comment) (small spoonfuls of water) Liquids provided via: Teaspoon Medication Administration: Crushed with puree Supervision: Staff to assist with self feeding;Full supervision/cueing for compensatory strategies                Oral Care Recommendations: Oral care QID;Oral care prior to ice chip/H20 Follow up Recommendations: Skilled Nursing facility SLP Visit Diagnosis: Dysphagia, oropharyngeal phase (R13.12) Plan: Continue with current plan of care       GO                Mahala Menghini., M.A. CCC-SLP Acute Rehabilitation Services Pager [phone] Office [phone]  01/18/2020, 4:30 PM

## 2020-01-18 NOTE — Progress Notes (Signed)
PROGRESS NOTE    Sydney Schultz   WIO:[id number]  DOB: 05/05/1932  DOA: 01/16/2020 PCP: Jaclyn Shaggy, MD   Brief Narrative:  Sydney Schultz is a 86 y.o. female with medical history significant of DM, HTN, OSA and OA. She has been followed by neurology at [facility] with last OV 02/17/19 for f/u of evaluation for change in consciousness episode. Evaluation was non-focal. MRI at that time revealed cerebral atrophy and 2 old lacuanr infarcts right cerebral hemisphere. Patient has had progressive decline and prior to the precipitating event required assistance with dressing, toileting and mobility. On 01/15/20 she had increased weakness and around 5 PM was noted to hve developed a left facial droop and slurred speech. EMS was called and patient was brought to [facility] for further evaluation.    MRI brain:  1. Acute right PCA territory infarct, predominantly involving the temporal and occipital cortex. 2. Small acute infarct of the left thalamus.  Subjective: She has no complaints.     Assessment & Plan:   Principal Problem:   CVA (cerebral vascular accident) - possibly embolic - LDL 131, Cholesterol 209 - A1c 5.6 - neuro recommends a 30 day event monitor - per SLP, she should still remain NPO as she is a moderate risk for aspiratin - holding all oral meds due to this- She has failed the MBS- will wait on SLP to follow up today and tomorrow see if she can regain some swallowing ability otherwise, would recommend feeding tube tomorrow- I have discussed this with her daughter - PT recommended SNF  Active Problems:   DM (diabetes mellitus)  - she is intermittently hypoglycemic- d/c insulin sliding scale- follow    HTN (hypertension) - holding antihypertensive due to acute CVA - PRN Hydralazine ordered today   Dementia - on Aricept as outpt- not able to swallow pills at this time  CKD3 - stable     Time spent in minutes: 30 DVT prophylaxis: enoxaparin  (LOVENOX) injection 30 mg Start: 01/16/20 1000    Code Status: full code Family Communication:  Disposition Plan:  Status is: Inpatient  Remains inpatient appropriate because:NPO, on IVF after CVA- follow up on swallowing and discuss feeding tube if needed   Dispo: The patient is from: Home              Anticipated d/c is to: SNF              Anticipated d/c date is: 2 days              Patient currently is not medically stable to d/c.   Consultants:   neuro Procedures:   2 D ECHO 1. Left ventricular ejection fraction, by estimation, is 60 to 65%. The  left ventricle has normal function. The left ventricle has no regional  wall motion abnormalities. Left ventricular diastolic parameters are  consistent with Grade II diastolic  dysfunction (pseudonormalization). Elevated left ventricular end-diastolic  pressure.  2. Right ventricular systolic function is normal. The right ventricular  size is normal. There is moderately elevated pulmonary artery systolic  pressure. The estimated right ventricular systolic pressure is 60.8 mmHg.  3. Left atrial size was severely dilated.  4. The mitral valve is normal in structure. Mild mitral valve  regurgitation. Moderate mitral stenosis. The mean mitral valve gradient is  8.0 mmHg.  5. Tricuspid valve regurgitation is moderate.  6. The aortic valve is tricuspid. Aortic valve regurgitation is not  visualized. Moderate aortic  valve stenosis. Aortic valve mean gradient  measures 31.0 mmHg. Aortic valve Vmax measures 3.56 m/s.  7. The inferior vena cava is normal in size with greater than 50%  respiratory variability, suggesting right atrial pressure of 3 mmHg.  Antimicrobials:  Anti-infectives (From admission, onward)   None       Objective: Vitals:   01/18/20 0500 01/18/20 0600 01/18/20 0811 01/18/20 1142  BP:   (!) 169/63 (!) 187/72  Pulse:   64 67  Resp: 17 18 20 20   Temp:   97.8 F (36.6 C) 98 F (36.7 C)  TempSrc:    Oral Axillary  SpO2:   98% 100%  Weight:      Height:        Intake/Output Summary (Last 24 hours) at 01/18/2020 1407 Last data filed at 01/18/2020 0645 Gross per 24 hour  Intake 1443.07 ml  Output --  Net 1443.07 ml   Filed Weights   01/16/20 0308  Weight: 53.8 kg    Examination: General exam: Appears comfortable  HEENT: PERRLA, oral mucosa moist, no sclera icterus or thrush Respiratory system: Clear to auscultation. Respiratory effort normal. Cardiovascular system: S1 & S2 heard,  No murmurs  Gastrointestinal system: Abdomen soft, non-tender, nondistended. Normal bowel sounds  Central nervous system: Alert - left facial droop, mild dysarthria  Extremities: No cyanosis, clubbing or edema Skin: No rashes or ulcers Psychiatry:  Mood & affect appropriate.     Data Reviewed: I have personally reviewed following labs and imaging studies  CBC: Recent Labs  Lab 01/16/20 0220 01/16/20 0225  WBC 9.6  --   NEUTROABS 7.8*  --   HGB 13.8 15.6*  HCT 45.4 46.0  MCV 97.2  --   PLT 257  --    Basic Metabolic Panel: Recent Labs  Lab 01/16/20 0220 01/16/20 0225 01/16/20 0507  NA 141 143 143  K 3.5 3.4* 3.7  CL 107 108 107  CO2 17*  --  20*  GLUCOSE 244* 221* 141*  BUN 16 18 16   CREATININE 1.50* 1.30* 1.30*  CALCIUM 10.8*  --  10.8*   GFR: Estimated Creatinine Clearance: 25 mL/min (A) (by C-G formula based on SCr of 1.3 mg/dL (H)). Liver Function Tests: Recent Labs  Lab 01/16/20 0220  AST 33  ALT 19  ALKPHOS 101  BILITOT 0.7  PROT 7.1  ALBUMIN 4.3   No results for input(s): LIPASE, AMYLASE in the last 168 hours. No results for input(s): AMMONIA in the last 168 hours. Coagulation Profile: Recent Labs  Lab 01/16/20 0220  INR 1.1   Cardiac Enzymes: No results for input(s): CKTOTAL, CKMB, CKMBINDEX, TROPONINI in the last 168 hours. BNP (last 3 results) No results for input(s): PROBNP in the last 8760 hours. HbA1C: Recent Labs    01/16/20 0507  HGBA1C 5.6    CBG: Recent Labs  Lab 01/18/20 0634 01/18/20 1155 01/18/20 1229 01/18/20 1309 01/18/20 1344  GLUCAP 74 62* 59* 64* 76   Lipid Profile: Recent Labs    01/16/20 0500  CHOL 209*  HDL 64  LDLCALC 131*  TRIG 68  CHOLHDL 3.3   Thyroid Function Tests: No results for input(s): TSH, T4TOTAL, FREET4, T3FREE, THYROIDAB in the last 72 hours. Anemia Panel: No results for input(s): VITAMINB12, FOLATE, FERRITIN, TIBC, IRON, RETICCTPCT in the last 72 hours. Urine analysis:    Component Value Date/Time   COLORURINE YELLOW (A) 10/18/2018 1441   APPEARANCEUR CLEAR (A) 10/18/2018 1441   APPEARANCEUR Clear 02/04/2014 2021   LABSPEC  1.012 10/18/2018 1441   LABSPEC 1.005 02/04/2014 2021   PHURINE 5.0 10/18/2018 1441   GLUCOSEU >=500 (A) 10/18/2018 1441   GLUCOSEU Negative 02/04/2014 2021   HGBUR NEGATIVE 10/18/2018 1441   BILIRUBINUR NEGATIVE 10/18/2018 1441   BILIRUBINUR Negative 02/04/2014 2021   KETONESUR NEGATIVE 10/18/2018 1441   PROTEINUR NEGATIVE 10/18/2018 1441   NITRITE NEGATIVE 10/18/2018 1441   LEUKOCYTESUR NEGATIVE 10/18/2018 1441   LEUKOCYTESUR Negative 02/04/2014 2021   Sepsis Labs: @LABRCNTIP (procalcitonin:4,lacticidven:4) ) Recent Results (from the past 240 hour(s))  SARS Coronavirus 2 by RT PCR (hospital order, performed in [facility] lab) Nasopharyngeal Nasopharyngeal Swab     Status: None   Collection Time: 01/16/20  5:25 AM   Specimen: Nasopharyngeal Swab  Result Value Ref Range Status   SARS Coronavirus 2 NEGATIVE NEGATIVE Final    Comment: (NOTE) SARS-CoV-2 target nucleic acids are NOT DETECTED.  The SARS-CoV-2 RNA is generally detectable in upper and lower respiratory specimens during the acute phase of infection. The lowest concentration of SARS-CoV-2 viral copies this assay can detect is 250 copies / mL. A negative result does not preclude SARS-CoV-2 infection and should not be used as the sole basis for treatment or other patient  management decisions.  A negative result may occur with improper specimen collection / handling, submission of specimen other than nasopharyngeal swab, presence of viral mutation(s) within the areas targeted by this assay, and inadequate number of viral copies (<250 copies / mL). A negative result must be combined with clinical observations, patient history, and epidemiological information.  Fact Sheet for Patients:   BoilerBrush.com.cyhttps://www.fda.gov/media/136312/download  Fact Sheet for Healthcare Providers: https://pope.com/https://www.fda.gov/media/136313/download  This test is not yet approved or  cleared by the [place] FDA and has been authorized for detection and/or diagnosis of SARS-CoV-2 by FDA under an Emergency Use Authorization (EUA).  This EUA will remain in effect (meaning this test can be used) for the duration of the COVID-19 declaration under Section 564(b)(1) of the Act, 21 U.S.C. section 360bbb-3(b)(1), unless the authorization is terminated or revoked sooner.  Performed at [facility], [address] [place], [place] [address]          Radiology Studies: DG Swallowing Func-Speech Pathology  Result Date: 01/17/2020 Objective Swallowing Evaluation: Type of Study: MBS-Modified Barium Swallow Study  Patient Details Name: Marchelle FolksDoris M Poteete MRN: [id number] Date of Birth: 11-28-1931 Today's Date: 01/17/2020 Time: SLP Start Time (ACUTE ONLY): 1150 -SLP Stop Time (ACUTE ONLY): 1214 SLP Time Calculation (min) (ACUTE ONLY): 24 min Past Medical History: Past Medical History: Diagnosis Date . Arthritis  . Diabetes (HCC)  . Dyspnea   with exertion . GERD (gastroesophageal reflux disease)  . Hypertension  . Incontinence  . Sleep apnea   uses cpap . Swelling of both lower extremities  . Wheezing  Past Surgical History: Past Surgical History: Procedure Laterality Date . ANKLE SURGERY   . CARDIAC CATHETERIZATION    coronary stent . CARPAL TUNNEL RELEASE Right  . CATARACT EXTRACTION W/PHACO Left 07/29/2017   Procedure: CATARACT EXTRACTION PHACO AND INTRAOCULAR LENS PLACEMENT (IOC);  Surgeon: Galen ManilaPorfilio, William, MD;  Location: [facility] ORS;  Service: Ophthalmology;  Laterality: Left;  US 00:38.9 AP% 12.0 CDE 4.68 Fluid Pack Lot # [id number] H . CATARACT EXTRACTION W/PHACO Right 08/19/2017  Procedure: CATARACT EXTRACTION PHACO AND INTRAOCULAR LENS PLACEMENT (IOC);  Surgeon: Galen ManilaPorfilio, William, MD;  Location: [facility] ORS;  Service: Ophthalmology;  Laterality: Right;  US 00:35 AP% 16.0 CDE 5.69 Fluid pack lot # [id number] H . CESAREAN SECTION  x 2 . CORONARY ANGIOPLASTY    STENT . FOOT SURGERY Left   ankle tendon surgery . JOINT REPLACEMENT Bilateral   TKR/ RIGHT SHOULDER . KNEE SURGERY Bilateral   total knee replacements . SHOULDER SURGERY Right   shoulder replacement . tumor removed    neck HPI: Pt is an 86 yo female presenting with AMS and L facial droop. MRI showed R lower pontine, R PCA, and L thalamic infarcts. PMH includes: DM, TIAs, GERD, HTN, OSA, wheezing, OA  Subjective: asking for food Assessment / Plan / Recommendation CHL IP CLINICAL IMPRESSIONS 01/17/2020 Clinical Impression Pt has a moderate oropahryngeal dysphagia with oral intake quite effortful across trials. She has very limited labial seal so that she is unable to suck from a straw, but she also has trouble sealing her lips around a cup or spoon. Anterior loss is significant with thin liquids, at times only getting a trace amount of liquid in her mouth. Oral holding is more prominent with purees, but her attempts at oral transit once initiated are sluggish and small. Her base of tongue retraction and pharyngeal squeeze are also reduced, limiting full epiglottic inversion and resulting in vallecular residue. Residue also coats her tongue, with most of the residue collecting more posteriorly at the base of her tongue. Thin liquids do help to clear some of this residue, but it takes her multiple bolus attempts until she can keep enough thin liquid in her mouth. Her airway  protection is relatively intact though, with trace and transient penetration observed x1. Overall, her swallowing is not efficient to provide a consistent or adequate source of intake at this time. Would offer small, single sips of water with full supervision after oral care to try to facilitate use of swallowing musculature and provide moisture. SLP will f/u for additional therapeutic boluses with potential to initiate PO diet as efficiency improves.  SLP Visit Diagnosis Dysphagia, oropharyngeal phase (R13.12) Attention and concentration deficit following -- Frontal lobe and executive function deficit following -- Impact on safety and function Moderate aspiration risk   CHL IP TREATMENT RECOMMENDATION 01/17/2020 Treatment Recommendations Therapy as outlined in treatment plan below   Prognosis 01/17/2020 Prognosis for Safe Diet Advancement Good Barriers to Reach Goals Cognitive deficits Barriers/Prognosis Comment -- [facility]L IP DIET RECOMMENDATION 01/17/2020 SLP Diet Recommendations NPO;Other (Comment) Liquid Administration via Spoon;Cup Medication Administration Via alternative means Compensations -- Postural Changes --   CHL IP OTHER RECOMMENDATIONS 01/17/2020 Recommended Consults -- Oral Care Recommendations Oral care QID Other Recommendations Have oral suction available   [facility] IP FOLLOW UP RECOMMENDATIONS 01/17/2020 Follow up Recommendations Skilled Nursing facility   [facility] IP FREQUENCY AND DURATION 01/17/2020 Speech Therapy Frequency (ACUTE ONLY) min 2x/week Treatment Duration 2 weeks      CHL IP ORAL PHASE 01/17/2020 Oral Phase Impaired Oral - Pudding Teaspoon -- Oral - Pudding Cup -- Oral - Honey Teaspoon -- Oral - Honey Cup -- Oral - Nectar Teaspoon -- Oral - Nectar Cup -- Oral - Nectar Straw -- Oral - Thin Teaspoon Weak lingual manipulation;Lingual pumping;Reduced posterior propulsion;Holding of bolus;Lingual/palatal residue;Delayed oral transit;Decreased bolus cohesion;Premature spillage;Left anterior bolus loss Oral - Thin Cup  Weak lingual manipulation;Lingual pumping;Reduced posterior propulsion;Holding of bolus;Lingual/palatal residue;Delayed oral transit;Decreased bolus cohesion;Premature spillage;Left anterior bolus loss Oral - Thin Straw -- Oral - Puree Weak lingual manipulation;Lingual pumping;Reduced posterior propulsion;Holding of bolus;Lingual/palatal residue;Delayed oral transit;Decreased bolus cohesion;Premature spillage Oral - Mech Soft -- Oral - Regular -- Oral - Multi-Consistency -- Oral - Pill -- Oral Phase - Comment --  CHL IP PHARYNGEAL PHASE 01/17/2020 Pharyngeal Phase Impaired Pharyngeal- Pudding Teaspoon -- Pharyngeal -- Pharyngeal- Pudding Cup -- Pharyngeal -- Pharyngeal- Honey Teaspoon -- Pharyngeal -- Pharyngeal- Honey Cup -- Pharyngeal -- Pharyngeal- Nectar Teaspoon -- Pharyngeal -- Pharyngeal- Nectar Cup -- Pharyngeal -- Pharyngeal- Nectar Straw -- Pharyngeal -- Pharyngeal- Thin Teaspoon Reduced tongue base retraction;Reduced pharyngeal peristalsis;Pharyngeal residue - valleculae;Reduced epiglottic inversion Pharyngeal -- Pharyngeal- Thin Cup Reduced tongue base retraction;Reduced pharyngeal peristalsis;Pharyngeal residue - valleculae;Penetration/Aspiration before swallow;Reduced epiglottic inversion Pharyngeal Material enters airway, remains ABOVE vocal cords then ejected out Pharyngeal- Thin Straw -- Pharyngeal -- Pharyngeal- Puree Reduced tongue base retraction;Reduced pharyngeal peristalsis;Pharyngeal residue - valleculae;Reduced epiglottic inversion;Delayed swallow initiation-vallecula Pharyngeal -- Pharyngeal- Mechanical Soft -- Pharyngeal -- Pharyngeal- Regular -- Pharyngeal -- Pharyngeal- Multi-consistency -- Pharyngeal -- Pharyngeal- Pill -- Pharyngeal -- Pharyngeal Comment --  CHL IP CERVICAL ESOPHAGEAL PHASE 01/17/2020 Cervical Esophageal Phase WFL Pudding Teaspoon -- Pudding Cup -- Honey Teaspoon -- Honey Cup -- Nectar Teaspoon -- Nectar Cup -- Nectar Straw -- Thin Teaspoon -- Thin Cup -- Thin Straw --  Puree -- Mechanical Soft -- Regular -- Multi-consistency -- Pill -- Cervical Esophageal Comment -- Mahala Menghini., M.A. CCC-SLP Acute Rehabilitation Services Pager [phone] Office [phone] 01/17/2020, 1:46 PM              ECHOCARDIOGRAM COMPLETE  Result Date: 01/17/2020    ECHOCARDIOGRAM REPORT   Patient Name:   DYLAN RUOTOLO Date of Exam: 01/17/2020 Medical Rec #:  [id number]       Height:       62.0 in Accession #:    [id number]      Weight:       118.6 lb Date of Birth:  Oct 06, 1931      BSA:          1.531 m Patient Age:    87 years        BP:           134/62 mmHg Patient Gender: F               HR:           72 bpm. Exam Location:  Inpatient Procedure: 2D Echo Indications:    Stroke I163.9  History:        Patient has prior history of Echocardiogram examinations. Risk                 Factors:Hypertension and Diabetes.  Sonographer:    Thurman Coyer RDCS (AE) Referring Phys: [id number] MICHAEL E NORINS IMPRESSIONS  1. Left ventricular ejection fraction, by estimation, is 60 to 65%. The left ventricle has normal function. The left ventricle has no regional wall motion abnormalities. Left ventricular diastolic parameters are consistent with Grade II diastolic dysfunction (pseudonormalization). Elevated left ventricular end-diastolic pressure.  2. Right ventricular systolic function is normal. The right ventricular size is normal. There is moderately elevated pulmonary artery systolic pressure. The estimated right ventricular systolic pressure is 60.8 mmHg.  3. Left atrial size was severely dilated.  4. The mitral valve is normal in structure. Mild mitral valve regurgitation. Moderate mitral stenosis. The mean mitral valve gradient is 8.0 mmHg.  5. Tricuspid valve regurgitation is moderate.  6. The aortic valve is tricuspid. Aortic valve regurgitation is not visualized. Moderate aortic valve stenosis. Aortic valve mean gradient measures 31.0 mmHg. Aortic valve Vmax measures 3.56 m/s.  7. The inferior vena cava  is normal in size with greater than 50% respiratory variability, suggesting right atrial pressure of 3 mmHg.  FINDINGS  Left Ventricle: Left ventricular ejection fraction, by estimation, is 60 to 65%. The left ventricle has normal function. The left ventricle has no regional wall motion abnormalities. The left ventricular internal cavity size was normal in size. There is  no left ventricular hypertrophy. Left ventricular diastolic parameters are consistent with Grade II diastolic dysfunction (pseudonormalization). Elevated left ventricular end-diastolic pressure. Right Ventricle: The right ventricular size is normal. No increase in right ventricular wall thickness. Right ventricular systolic function is normal. There is moderately elevated pulmonary artery systolic pressure. The tricuspid regurgitant velocity is 3.80 m/s, and with an assumed right atrial pressure of 3 mmHg, the estimated right ventricular systolic pressure is 60.8 mmHg. Left Atrium: Left atrial size was severely dilated. Right Atrium: Right atrial size was normal in size. Pericardium: There is no evidence of pericardial effusion. Mitral Valve: The mitral valve is normal in structure. There is moderate thickening of the mitral valve leaflet(s). Normal mobility of the mitral valve leaflets. Severe mitral annular calcification. Mild mitral valve regurgitation. Moderate mitral valve stenosis. MV peak gradient, 17.1 mmHg. The mean mitral valve gradient is 8.0 mmHg. Tricuspid Valve: The tricuspid valve is normal in structure. Tricuspid valve regurgitation is moderate . No evidence of tricuspid stenosis. Aortic Valve: The aortic valve is tricuspid. . There is severe thickening of the aortic valve. Aortic valve regurgitation is not visualized. Moderate aortic stenosis is present. Severe aortic valve annular calcification. There is severe thickening of the  aortic valve. Aortic valve mean gradient measures 31.0 mmHg. Aortic valve peak gradient measures 50.7  mmHg. Aortic valve area, by VTI measures 0.69 cm. Pulmonic Valve: The pulmonic valve was normal in structure. Pulmonic valve regurgitation is not visualized. No evidence of pulmonic stenosis. Aorta: The aortic root is normal in size and structure. Venous: The inferior vena cava is normal in size with greater than 50% respiratory variability, suggesting right atrial pressure of 3 mmHg. IAS/Shunts: No atrial level shunt detected by color flow Doppler.  LEFT VENTRICLE PLAX 2D LVIDd:         3.90 cm  Diastology LVIDs:         2.30 cm  LV e' lateral:   4.26 cm/s LV PW:         0.90 cm  LV E/e' lateral: 44.1 LV IVS:        1.00 cm  LV e' medial:    3.30 cm/s LVOT diam:     1.80 cm  LV E/e' medial:  57.0 LV SV:         58 LV SV Index:   38 LVOT Area:     2.54 cm  RIGHT VENTRICLE RV S prime:     12.30 cm/s TAPSE (M-mode): 2.3 cm LEFT ATRIUM             Index       RIGHT ATRIUM           Index LA diam:        3.70 cm 2.42 cm/m  RA Area:     11.80 cm LA Vol (A2C):   82.4 ml 53.82 ml/m RA Volume:   22.40 ml  14.63 ml/m LA Vol (A4C):   75.0 ml 48.99 ml/m LA Biplane Vol: 78.7 ml 51.40 ml/m  AORTIC VALVE AV Area (Vmax):    0.61 cm AV Area (Vmean):   0.54 cm AV Area (VTI):     0.69 cm AV Vmax:           356.00 cm/s AV  Vmean:          263.000 cm/s AV VTI:            0.839 m AV Peak Grad:      50.7 mmHg AV Mean Grad:      31.0 mmHg LVOT Vmax:         85.00 cm/s LVOT Vmean:        55.300 cm/s LVOT VTI:          0.229 m LVOT/AV VTI ratio: 0.27  AORTA Ao Root diam: 2.90 cm MITRAL VALVE                TRICUSPID VALVE MV Area (PHT): 2.39 cm     TR Peak grad:   57.8 mmHg MV Peak grad:  17.1 mmHg    TR Vmax:        380.00 cm/s MV Mean grad:  8.0 mmHg MV Vmax:       2.07 m/s     SHUNTS MV Vmean:      130.0 cm/s   Systemic VTI:  0.23 m MV Decel Time: 317 msec     Systemic Diam: 1.80 cm MV E velocity: 188.00 cm/s MV A velocity: 126.00 cm/s MV E/A ratio:  1.49 Armanda Magic MD Electronically signed by Armanda Magic MD Signature  Date/Time: 01/17/2020/12:06:26 PM    Final    VAS [place] LOWER EXTREMITY VENOUS (DVT)  Result Date: 01/17/2020  Lower Venous DVTStudy Indications: Stroke.  Limitations: Altered mental status/poor cooperation. Comparison Study: Prior negative study done 11/23/16 is available for comparison Performing Technologist: Sherren Kerns RVS  Examination Guidelines: A complete evaluation includes B-mode imaging, spectral Doppler, color Doppler, and power Doppler as needed of all accessible portions of each vessel. Bilateral testing is considered an integral part of a complete examination. Limited examinations for reoccurring indications may be performed as noted. The reflux portion of the exam is performed with the patient in reverse Trendelenburg.  +---------+---------------+---------+-----------+----------+--------------+ RIGHT    CompressibilityPhasicitySpontaneityPropertiesThrombus Aging +---------+---------------+---------+-----------+----------+--------------+ CFV      Full           Yes      Yes                                 +---------+---------------+---------+-----------+----------+--------------+ SFJ      Full                                                        +---------+---------------+---------+-----------+----------+--------------+ FV Prox  Full                                                        +---------+---------------+---------+-----------+----------+--------------+ FV Mid   Full                                                        +---------+---------------+---------+-----------+----------+--------------+ FV DistalFull                                                        +---------+---------------+---------+-----------+----------+--------------+  PFV      Full                                                        +---------+---------------+---------+-----------+----------+--------------+ POP      Full           Yes      Yes                                  +---------+---------------+---------+-----------+----------+--------------+ PTV      Full                                                        +---------+---------------+---------+-----------+----------+--------------+ PERO     Full                                                        +---------+---------------+---------+-----------+----------+--------------+   +---------+---------------+---------+-----------+----------+-------------------+ LEFT     CompressibilityPhasicitySpontaneityPropertiesThrombus Aging      +---------+---------------+---------+-----------+----------+-------------------+ CFV      Full           Yes      Yes                                      +---------+---------------+---------+-----------+----------+-------------------+ SFJ      Full                                                             +---------+---------------+---------+-----------+----------+-------------------+ FV Prox  Full                                                             +---------+---------------+---------+-----------+----------+-------------------+ FV Mid   Full                                                             +---------+---------------+---------+-----------+----------+-------------------+ FV DistalFull                                                             +---------+---------------+---------+-----------+----------+-------------------+ PFV      Full                                                             +---------+---------------+---------+-----------+----------+-------------------+  POP                                                   patent by color and                                                       Doppler             +---------+---------------+---------+-----------+----------+-------------------+ PTV      Full                                                              +---------+---------------+---------+-----------+----------+-------------------+ PERO     Full                                                             +---------+---------------+---------+-----------+----------+-------------------+     Summary: RIGHT: - Findings appear essentially unchanged compared to previous examination. - There is no evidence of deep vein thrombosis in the lower extremity.  LEFT: - Findings appear essentially unchanged compared to previous examination. - There is no evidence of deep vein thrombosis in the lower extremity. However, portions of this examination were limited- see technologist comments above.  *See table(s) above for measurements and observations.    Preliminary       Scheduled Meds: .  stroke: mapping our early stages of recovery book   Does not apply Once  . acetaminophen  650 mg Oral BID  . aspirin  300 mg Rectal Daily  . clopidogrel  75 mg Oral QPM  . colestipol  2 g Oral QPM  . enoxaparin (LOVENOX) injection  30 mg Subcutaneous Q24H  . simvastatin  40 mg Oral q1800   And  . ezetimibe  10 mg Oral q1800  . insulin aspart  0-15 Units Subcutaneous TID WC  . oxybutynin  5 mg Oral BID  . pantoprazole  40 mg Oral Daily   Continuous Infusions: . sodium chloride 50 mL/hr at 01/17/20 1630  . dextrose 5 % and 0.9% NaCl 50 mL/hr at 01/18/20 1319     LOS: 2 days      Calvert Cantor, MD [facility] Pager: [email or web address] 01/18/2020, 2:07 PM

## 2020-01-19 DIAGNOSIS — E440 Moderate protein-calorie malnutrition: Secondary | ICD-10-CM | POA: Insufficient documentation

## 2020-01-19 LAB — GLUCOSE, CAPILLARY
Glucose-Capillary: 107 mg/dL — ABNORMAL HIGH (ref 70–99)
Glucose-Capillary: 109 mg/dL — ABNORMAL HIGH (ref 70–99)
Glucose-Capillary: 111 mg/dL — ABNORMAL HIGH (ref 70–99)
Glucose-Capillary: 116 mg/dL — ABNORMAL HIGH (ref 70–99)
Glucose-Capillary: 122 mg/dL — ABNORMAL HIGH (ref 70–99)
Glucose-Capillary: 95 mg/dL (ref 70–99)

## 2020-01-19 LAB — CBC
HCT: 40.4 % (ref 36.0–46.0)
Hemoglobin: 12.7 g/dL (ref 12.0–15.0)
MCH: 29 pg (ref 26.0–34.0)
MCHC: 31.4 g/dL (ref 30.0–36.0)
MCV: 92.2 fL (ref 80.0–100.0)
Platelets: 194 10*3/uL (ref 150–400)
RBC: 4.38 MIL/uL (ref 3.87–5.11)
RDW: 13.4 % (ref 11.5–15.5)
WBC: 3.9 10*3/uL — ABNORMAL LOW (ref 4.0–10.5)
nRBC: 0 % (ref 0.0–0.2)

## 2020-01-19 LAB — BASIC METABOLIC PANEL
Anion gap: 7 (ref 5–15)
BUN: 9 mg/dL (ref 8–23)
CO2: 21 mmol/L — ABNORMAL LOW (ref 22–32)
Calcium: 9.7 mg/dL (ref 8.9–10.3)
Chloride: 112 mmol/L — ABNORMAL HIGH (ref 98–111)
Creatinine, Ser: 0.71 mg/dL (ref 0.44–1.00)
GFR calc Af Amer: >60 mL/min (ref >60)
GFR calc non Af Amer: >60 mL/min (ref >60)
Glucose, Bld: 117 mg/dL — ABNORMAL HIGH (ref 70–99)
Potassium: 3.1 mmol/L — ABNORMAL LOW (ref 3.5–5.1)
Sodium: 140 mmol/L (ref 135–145)

## 2020-01-19 LAB — MAGNESIUM: Magnesium: 1.7 mg/dL (ref 1.7–2.4)

## 2020-01-19 MED ORDER — DONEPEZIL HCL 5 MG PO TABS: 5.0000 mg | ORAL_TABLET | Freq: Every day | ORAL | Status: DC

## 2020-01-19 MED ORDER — IRBESARTAN 150 MG PO TABS
75.0000 mg | ORAL_TABLET | Freq: Every day | ORAL | Status: DC
  Administered 2020-01-19: 75 mg via ORAL
  Filled 2020-01-19: qty 1

## 2020-01-19 MED ORDER — POTASSIUM CHLORIDE 10 MEQ/100ML IV SOLN
10.0000 meq | INTRAVENOUS | Status: AC
  Administered 2020-01-19 (×3): 10 meq via INTRAVENOUS
  Filled 2020-01-19 (×3): qty 100

## 2020-01-19 MED ORDER — POLYVINYL ALCOHOL 1.4 % OP SOLN
1.0000 [drp] | Freq: Four times a day (QID) | OPHTHALMIC | Status: DC
  Administered 2020-01-19 – 2020-02-04 (×61): 1 [drp] via OPHTHALMIC
  Filled 2020-01-19: qty 15

## 2020-01-19 MED ORDER — OSMOLITE 1.2 CAL PO LIQD
1000.0000 mL | ORAL | Status: DC
  Administered 2020-01-19 – 2020-02-04 (×14): 1000 mL
  Filled 2020-01-19 (×19): qty 1000

## 2020-01-19 MED ORDER — ENOXAPARIN SODIUM 40 MG/0.4ML ~~LOC~~ SOLN
40.0000 mg | SUBCUTANEOUS | Status: DC
  Administered 2020-01-20 – 2020-02-04 (×13): 40 mg via SUBCUTANEOUS
  Filled 2020-01-19 (×16): qty 0.4

## 2020-01-19 MED ORDER — ASPIRIN 325 MG PO TABS
325.0000 mg | ORAL_TABLET | Freq: Every day | ORAL | Status: DC
  Administered 2020-01-20 – 2020-01-21 (×2): 325 mg
  Filled 2020-01-19 (×2): qty 1

## 2020-01-19 MED ORDER — POTASSIUM CHLORIDE 10 MEQ/100ML IV SOLN
INTRAVENOUS | Status: AC
  Administered 2020-01-19: 10 meq via INTRAVENOUS
  Filled 2020-01-19: qty 100

## 2020-01-19 MED ORDER — ARTIFICIAL TEARS OPHTHALMIC OINT
TOPICAL_OINTMENT | Freq: Every day | OPHTHALMIC | Status: DC
  Administered 2020-01-23: 1 via OPHTHALMIC
  Filled 2020-01-19: qty 3.5

## 2020-01-19 MED ORDER — FREE WATER
100.0000 mL | Freq: Four times a day (QID) | Status: DC
  Administered 2020-01-19 – 2020-02-02 (×50): 100 mL

## 2020-01-19 MED ORDER — HYPROMELLOSE (GONIOSCOPIC) 2.5 % OP SOLN: 1.0000 [drp] | Freq: Four times a day (QID) | OPHTHALMIC | Status: DC

## 2020-01-19 NOTE — Progress Notes (Addendum)
Patient had a cortrak tube placed today. MD paged to update medication route to per tube.   Per MD "she can take oral meds crushed with puree per speech"

## 2020-01-19 NOTE — Discharge Instructions (Signed)

## 2020-01-19 NOTE — TOC Progression Note (Signed)
Transition of Care [facility]) - Progression Note    Patient Details  Name: Sydney Schultz MRN: [id number] Date of Birth: 09-30-1931  Transition of Care [facility]) CM/SW Contact  Pollie Friar, RN Phone Number: 01/19/2020, 8:58 AM  Clinical Narrative:    CM met with the patient and her daughter, Sydney Schultz. Sydney Schultz is feeling that home is the best option currently. CM will follow for d/c needs closer to pt being ready for d/c.    Expected Discharge Plan: Skilled Nursing Facility Barriers to Discharge: Ship broker, Continued Medical Work up  Expected Discharge Plan and Services Expected Discharge Plan: [place] arrangements for the past 2 months: Single Family Home                                       Social Determinants of Health (SDOH) Interventions    Readmission Risk Interventions No flowsheet data found.

## 2020-01-19 NOTE — Progress Notes (Signed)
STROKE TEAM PROGRESS NOTE   INTERVAL HISTORY Daughter at bedside. Pt lying in bed, sleepy but easily arousable. Pleasant and able to follow limited simple commands but not orientated. On D5NS, glucose stable. However, still not able to pass swallow, discussed with daughter who also talked with her sister, they are Ok with cortrak placement. In terms of PEG, daughter stated that she would not like to rush for the decision and she would like to see how pt progress on swallow for the next several days. I also discussed with Dr. Lowell Guitar about this issue.   OBJECTIVE Vitals:   01/18/20 2316 01/19/20 0416 01/19/20 0840 01/19/20 1121  BP: (!) 173/85 (!) 135/92 (!) 162/77 (!) 179/74  Pulse: 78 71 72 65  Resp: 18 16 18 18   Temp: 98.6 F (37 C) 98.4 F (36.9 C) 99.5 F (37.5 C) 98.1 F (36.7 C)  TempSrc: Oral Oral Oral Oral  SpO2: 100% 100% 100% 99%  Weight:      Height:       CBC:  Recent Labs  Lab 01/16/20 0220 01/16/20 0220 01/16/20 0225 01/19/20 0329  WBC 9.6  --   --  3.9*  NEUTROABS 7.8*  --   --   --   HGB 13.8   < > 15.6* 12.7  HCT 45.4   < > 46.0 40.4  MCV 97.2  --   --  92.2  PLT 257  --   --  194   < > = values in this interval not displayed.   Basic Metabolic Panel:  Recent Labs  Lab 01/16/20 0507 01/19/20 0329  NA 143 140  K 3.7 3.1*  CL 107 112*  CO2 20* 21*  GLUCOSE 141* 117*  BUN 16 9  CREATININE 1.30* 0.71  CALCIUM 10.8* 9.7  MG  --  1.7   Lipid Panel:     Component Value Date/Time   CHOL 209 (H) 01/16/2020 0500   TRIG 68 01/16/2020 0500   HDL 64 01/16/2020 0500   CHOLHDL 3.3 01/16/2020 0500   VLDL 14 01/16/2020 0500   LDLCALC 131 (H) 01/16/2020 0500   HgbA1c:  Lab Results  Component Value Date   HGBA1C 5.6 01/16/2020   Urine Drug Screen: No results found for: LABOPIA, COCAINSCRNUR, LABBENZ, AMPHETMU, THCU, LABBARB  Alcohol Level No results found for: ETH  IMAGING  CT Code Stroke CTA Head W/WO contrast CT Code Stroke CTA Neck W/WO  contrast 01/16/2020   ADDENDUM: Dr. 03/17/2020 called for clarification of an arterial stenosis reference in a subsequent brain MRI report. The patient's right PCA territory infarct is associated with a severe P2 segment stenosis with flow gap. There is also a flow reducing left P1 segment stenosis which is ameliorated by a posterior communicating artery.  IMPRESSION:  1. No intracranial arterial occlusion or high-grade stenosis.  2. Approximately 50% stenosis of the proximal left internal carotid artery secondary to mixed density atherosclerosis.  Aortic Atherosclerosis (ICD10-I70.0).   MR BRAIN WO CONTRAST 01/16/2020 MPRESSION:  1. Acute right PCA territory infarct, predominantly involving the temporal and occipital cortex.  2. Small acute infarct of the left thalamus.  3. Short segment occlusion of the right P2 segment with distal reconstitution demonstrated on earlier CTA.  4. Severe chronic ischemic microangiopathy.   CT HEAD CODE STROKE WO CONTRAST 01/16/2020 MPRESSION:  1. No acute intracranial abnormality.  2. ASPECTS is 10.   DG Chest 2 View 01/16/2020 IMPRESSION:  No evidence of acute cardiopulmonary disease.   Transthoracic Echocardiogram  1. Left ventricular ejection fraction, by estimation, is 60 to 65%. The left ventricle has normal function. The left ventricle has no regional wall motion abnormalities. Left ventricular diastolic parameters are consistent with Grade II diastolic dysfunction (pseudonormalization). Elevated left ventricular end-diastolic pressure.  2. Right ventricular systolic function is normal. The right ventricular size is normal. There is moderately elevated pulmonary artery systolic pressure. The estimated right ventricular systolic pressure is 60.8 mmHg.  3. Left atrial size was severely dilated.  4. The mitral valve is normal in structure. Mild mitral valve regurgitation. Moderate mitral stenosis. The mean mitral valve gradient is 8.0 mmHg.  5. Tricuspid valve  regurgitation is moderate.  6. The aortic valve is tricuspid. Aortic valve regurgitation is not visualized. Moderate aortic valve stenosis. Aortic valve mean gradient measures 31.0 mmHg. Aortic valve Vmax measures 3.56 m/s.  7. The inferior vena cava is normal in size with greater than 50% respiratory variability, suggesting right atrial pressure of 3 mmHg.   Bilateral Lower Extremity Venous Dopplers   RIGHT:  - Findings appear essentially unchanged compared to previous examination.  - There is no evidence of deep vein thrombosis in the lower extremity.   LEFT:  - Findings appear essentially unchanged compared to previous examination.  - There is no evidence of deep vein thrombosis in the lower extremity.   ECG - SR rate 83 BPM. (See cardiology reading for complete details)  PHYSICAL EXAM    Temp:  [97.7 F (36.5 C)-99.5 F (37.5 C)] 98.1 F (36.7 C) (09/08 1121) Pulse Rate:  [65-78] 65 (09/08 1121) Resp:  [16-18] 18 (09/08 1121) BP: (135-179)/(74-124) 179/74 (09/08 1121) SpO2:  [99 %-100 %] 99 % (09/08 1121)  General - Well nourished, well developed, in no apparent distress, sleepy.  Ophthalmologic - fundi not visualized due to noncooperation.  Cardiovascular - Regular rhythm and rate.  Neuro - sleepy but easily arousable, eyes open, orientated to self and people but not to time or place.  Mild dysarthria, paucity of speech, limited answers for questions, but able to speak short sentences spontaneously, not cooperative with naming or repetition.  Bilateral gaze nystagmus, bilateral directions. Disconjugate eyes, with left eye upward deviation. PERRL. Not consistently blinking to visual threat to the left.  Left complete facial palsy with decreased left frontal ring toes, left watery eye, decreased left eye blinking, significanct left facial droop, however left palpebral fissure similar to or even smaller than the right.  Tongue midline.  Bilateral upper extremity 4/5, bilateral  lower extremity 2/5 proximal and 3/5 distal PF/DF.  Sensation subjectively symmetrical, but coordination not corporative.  Gait not tested.   ASSESSMENT/PLAN Ms. ALEXCIS BICKING is a 86 y.o. female with history of  "episodes" which previously been attributed to diabetes, TIAs, "sleep attacks"DM, HTN, CAD (s/p stent) and OSA, presents with confusion and shaking. She did not receive IV t-PA due to late presentation (>4.5 hours from time of onset).  Stroke: acute right PCA, right lower pontine, left thalamic infarcts - embolic pattern, source unclear.  Code Stroke CT Head -  No acute intracranial abnormality. ASPECTS is 10.     CTA H&N - severe R P2 segment stenosis with flow gap. There is also a flow reducing left P1 segment stenosis which is ameliorated by a posterior communicating artery.  MRI head - Acute right PCA territory infarct, predominantly involving the temporal and occipital cortex. Small acute infarct of the left thalamus. Also left lower pontine infarct.    LE Venous Dopplers - neg  DVT  2D Echo - EF 60-56%. No source of embolus. LA severely dilated.   Recommend 30-day cardiac event monitoring as outpatient to rule out A. fib  Sars Corona Virus 2 - negative  LDL - 131  HgbA1c - 5.6  VTE prophylaxis - Lovenox  clopidogrel 75 mg daily prior to admission, now on ASA 325 and plavix DAPT for 3 months and then plavix alone given the R P2 high grade stenosis.  Therapy recommendations:  SNF, but family would like to take her home  Disposition:  Pending  Episodes of altered consciousness  Per daughter, she has been having the unresponsive episodes for some time, lasting hours and resolved. She called it "diabetic episodes", but did not check glucose during the episodes.   Follows with Dr. Malvin Johns and NP Morrie Sheldon at [facility]  Etiology unclear, DDx including vertebrobasilar insufficiency, Lewy body dementia, seizure, syncope, hypoglycemia episode  Continue to follow up  with Dr. Malvin Johns and NP Morrie Sheldon  Hypertension  Home BP meds: irbesartan ; HCTZ  Current BP meds: none   Stable  . Long-term BP goal normotensive  Hyperlipidemia  Home Lipid lowering medication: none (Vytorin in the past)  LDL 131, goal < 70  Now on zocor 40 and zetia 10  Continue statin at discharge  Diabetes  Home diabetic meds: Jardiance  Current diabetic meds: SSI   HgbA1c 5.6, goal < 7.0  Hypoglycemic episode 9/7 - now on D5NS  CBG monitoring  PCP follow up  Dysphagia . Secondary to stroke . Failed swallow evaluation  . NPO  . Speech on board . Will put on cortrak for TF . Daughter does not want to rush for decision on PEG now, but would like to revisit it if pt swallow not getting better for the next several days.  Other Stroke Risk Factors  Advanced age  Coronary artery disease  Obstructive sleep apnea, on CPAP at home  Other Active Problems  Code status - DNR  Dementia - resumed Aricept via cortrak CKD - stage  3b - creatinine - 1.50->1.30->1.30-> 0.71 GERD  Hospital day # 3  Neurology will sign off. Please call with questions. Pt will follow up with Dr. Malvin Johns and NP Morrie Sheldon at [facility] in about 4 weeks. Thanks for the consult.   Marvel Plan, MD PhD Stroke Neurology 01/19/2020 12:35 PM  To contact Stroke Continuity provider, please refer to [email or web address]. After hours, contact General Neurology

## 2020-01-19 NOTE — Progress Notes (Signed)
PT Cancellation Note  Patient Details Name: Sydney Schultz MRN: [id number] DOB: January 12, 1932   Cancelled Treatment:    Reason Eval/Treat Not Completed: Patient at procedure or test/unavailable. PT checked on pt x2 this afternoon and was with other staff each time. Will continue to follow and progress as able per POC.    Marylynn Pearson 01/19/2020, 2:51 PM   Conni Slipper, PT, DPT Acute Rehabilitation Services Pager: [phone] Office: [phone]

## 2020-01-19 NOTE — Progress Notes (Addendum)
Initial Nutrition Assessment  DOCUMENTATION CODES:   Non-severe (moderate) malnutrition in context of chronic illness  INTERVENTION:   Initiate Osmolite 1.2 @ 20 ml/hr via cortrak tube and increase by 10 ml every 4 hours to goal rate of 60 ml/hr.   100 ml free water flush every 6 hours    Tube feeding regimen provides 1728 kcal (100% of needs), 80 grams of protein, and 1181 ml of H2O. Total free water: 1581 ml/day  -RD educated pt on DM diet per her request; attached "Carbohydrate Count for People with Diabetes" handout from [facility] Nutrition Care Manual  NUTRITION DIAGNOSIS:   Moderate Malnutrition related to chronic illness (TIA, CAD) as evidenced by energy intake < 75% for > or equal to 1 month, mild fat depletion, moderate fat depletion, mild muscle depletion, moderate muscle depletion.  GOAL:   Patient will meet greater than or equal to 90% of their needs  MONITOR:   Diet advancement, Labs, Weight trends, TF tolerance, Skin, I & O's  REASON FOR ASSESSMENT:   Consult Enteral/tube feeding initiation and management  ASSESSMENT:   [name] is a 86 y.o. female with history of  "episodes" which previously been attributed to diabetes, TIAs, "sleep attacks"DM, HTN, CAD (s/p stent) and OSA, presents with confusion and shaking. She did not receive IV t-PA due to late presentation (>4.5 hours from time of onset).  Pt admitted with stroke (rt acute PCA, rt lower pontine, lt thalamic infarcts).   9/6- s/p BSE- recommend NPO; s/p MBSS- recommend NPO 9/7- s/p BSE- recommend NPO  Reviewed I/O's: -1.1 L x 24 hours and +493 ml since admission  UOP: 1.1 L x 24 hours  Spoke with pt and daughter at bedside. Pt was able to answer very simple questions, but deferred multiple questions to her daughter. Daughter reports variable intake daily; she tries to be very conscious of pt's blood sugar levels and pt consumes 3 meals per day, 4 hours apart (Breakfast: fruit and oatmeal;  Lunch: sandwich OR frozen dinner; Dinner: meat, starch, and vegetable). Pt consumes mostly water and coffee. Intake tends to be erratic- pt will consume anywhere from 25-75% of meals depending on the day.   Pt daughter endorses wt loss, however, unsure of amount. She is concerned over loss and fat and muscle. Noted distant wt loss in the chart.   Reviewed rationale for NPO order. Pt daughter is willing to pursue cortrak placement. Discussed how pt will receive her nutrition currently and what transition to PO intake might look like if pt becomes able to pass swallow evaluation.   Pt daughter also with many questions related to diabetes and healthy eating. RD provided general guidance on healthy snack and meal choices. Also reviewed reading food labels per her request.   Case discussed with Dr. Roda Shutters and Dr. Lowell Guitar; received permission to initiated TF once cortrak placed. Plan to d/c D5 once TF has started.   Medications reviewed and include dextrose 5%-0.9% sodium chloride infusion @ 75 ml/hr.   Lab Results  Component Value Date   HGBA1C 5.6 01/16/2020   PTA DM medications are 500 mg metformin the the morning and 250 mg metformin at night.   Labs reviewed: K: 3.1, CBGS: 109-122 (inpatient orders for glycemic control are none).   NUTRITION - FOCUSED PHYSICAL EXAM:    Most Recent Value  Orbital Region Mild depletion  Upper Arm Region Mild depletion  Thoracic and Lumbar Region No depletion  Buccal Region No depletion  Temple Region Moderate  depletion  Clavicle Bone Region Moderate depletion  Clavicle and Acromion Bone Region Mild depletion  Scapular Bone Region Mild depletion  Dorsal Hand Mild depletion  Patellar Region Mild depletion  Anterior Thigh Region Mild depletion  Posterior Calf Region Mild depletion  Edema (RD Assessment) Mild  Hair Reviewed  Eyes Reviewed  Mouth Reviewed  Skin Reviewed  Nails Reviewed       Diet Order:   Diet Order            Diet NPO time  specified  Diet effective now                 EDUCATION NEEDS:   Education needs have been addressed  Skin:  Skin Assessment: Reviewed RN Assessment  Last BM:  01/19/20  Height:   Ht Readings from Last 1 Encounters:  01/16/20 5\' 2"  (1.575 m)    Weight:   Wt Readings from Last 1 Encounters:  01/16/20 53.8 kg    Ideal Body Weight:  50 kg  BMI:  Body mass index is 21.69 kg/m.  Estimated Nutritional Needs:   Kcal:  1600-1800  Protein:  80-95 grams  Fluid:  > 1.6 L    03/17/20, RD, LDN, CDCES Registered Dietitian II Certified Diabetes Care and Education Specialist Please refer to [facility] for RD and/or RD on-call/weekend/after hours pager

## 2020-01-19 NOTE — Progress Notes (Signed)
PROGRESS NOTE    Sydney Schultz  IDP:[id number] DOB: April 01, 1932 DOA: 01/16/2020 PCP: Jaclyn Shaggy, MD   Chief Complaint  Patient presents with  . Code Stroke    Brief Narrative:  Sydney Schultz 86 y.o.femalewith medical history significant ofDM, HTN, OSA and OA. She has been followed by neurology at [facility] with last OV 02/17/19 for f/u of evaluation for change in consciousness episode. Evaluation was non-focal. MRI at that time revealed cerebral atrophy and 2 old lacuanr infarcts right cerebral hemisphere. Patient has had progressive decline and prior to the precipitating event required assistance with dressing, toileting and mobility. On 01/15/20 she had increased weakness and around 5 PM was noted to hve developed Sydney Schultz left facial droop and slurred speech. EMS was called and patient was brought to [facility] for further evaluation.  MRI brain:  1. Acute right PCA territory infarct, predominantly involving the temporal and occipital cortex. 2. Small acute infarct of the left thalamus.  Assessment & Plan:   Principal Problem:   CVA (cerebral vascular accident) (HCC) Active Problems:   DM (diabetes mellitus) (HCC)   HTN (hypertension)   GERD (gastroesophageal reflux disease)   OSA (obstructive sleep apnea)   Malnutrition of moderate degree  CVA (cerebral vascular accident) - possibly embolic MRI with acute R PCA territory infarct predominantly involving the temporal and occipital cortex.  Small acute infarct of L thalamus.  Short segment occlusion of the R P2 segment with distal reconstitution demonstrated on earlier CTA.  Severe chronic microangiopathy. - per neurology, embolic pattern, source unclear - recommending 30 day cardiac event monitor to r/o atrial fibrillation (msg sent to card master to help arrange follow up) - LDL 131, Cholesterol 209 - zocor 40 + zetia 10 - A1c 5.6 - Continue DAPT x 3 months and then plavix alone - therapy recommending  SNF, but family wants her to go home - per SLP, not ready for PO diet, recommending spoonfulls of water and meds crushed with puree  - will place cortrak and follow progress with speech through the weekend.  If she's not improving, will need to discuss PEG early next week (discussed with neurology).   - for incomplete eyelid closure on L, QID eyedrops and nightly ointment  Dysphagia: cortrak placed 9/8, follow progress with speech prior to deciding on peg  Acute Metabolic Encephalopathy: ? Unresponsive spells per neurology note.  Following with Dr. Malvin Johns and NP Morrie Sheldon at [facility].  Continue outpatient follow up.  DM (diabetes mellitus)  - a1c 5.6, SSI stopped as intermittently hypoglycemic     HTN (hypertension) - gradually restart home meds (start with irbesartan low dose, continue to hold HCTZ)   Dementia - continue aricept  CKD3 - stable  DVT prophylaxis: lovenox Code Status:DNR Family Communication: daughter at bedside and over phone Disposition:   Status is: Inpatient  Remains inpatient appropriate because:Inpatient level of care appropriate due to severity of illness   Dispo: The patient is from: Home              Anticipated d/c is to: pending, likely home, but SNF recommended              Anticipated d/c date is: > 3 days              Patient currently is not medically stable to d/c.   Consultants:   neurology  Procedures:  IMPRESSIONS    1. Left ventricular ejection fraction, by estimation, is 60  to 65%. The  left ventricle has normal function. The left ventricle has no regional  wall motion abnormalities. Left ventricular diastolic parameters are  consistent with Grade II diastolic  dysfunction (pseudonormalization). Elevated left ventricular end-diastolic  pressure.  2. Right ventricular systolic function is normal. The right ventricular  size is normal. There is moderately elevated pulmonary artery systolic  pressure. The estimated right  ventricular systolic pressure is 60.8 mmHg.  3. Left atrial size was severely dilated.  4. The mitral valve is normal in structure. Mild mitral valve  regurgitation. Moderate mitral stenosis. The mean mitral valve gradient is  8.0 mmHg.  5. Tricuspid valve regurgitation is moderate.  6. The aortic valve is tricuspid. Aortic valve regurgitation is not  visualized. Moderate aortic valve stenosis. Aortic valve mean gradient  measures 31.0 mmHg. Aortic valve Vmax measures 3.56 m/s.  7. The inferior vena cava is normal in size with greater than 50%  respiratory variability, suggesting right atrial pressure of 3 mmHg.   Antimicrobials:  Anti-infectives (From admission, onward)   None     Subjective: No new complaints Sydney Schultz&Ox1  Objective: Vitals:   01/18/20 2316 01/19/20 0416 01/19/20 0840 01/19/20 1121  BP: (!) 173/85 (!) 135/92 (!) 162/77 (!) 179/74  Pulse: 78 71 72 65  Resp: 18 16 18 18   Temp: 98.6 F (37 C) 98.4 F (36.9 C) 99.5 F (37.5 C) 98.1 F (36.7 C)  TempSrc: Oral Oral Oral Oral  SpO2: 100% 100% 100% 99%  Weight:      Height:        Intake/Output Summary (Last 24 hours) at 01/19/2020 1605 Last data filed at 01/19/2020 0416 Gross per 24 hour  Intake --  Output 1100 ml  Net -1100 ml   Filed Weights   01/16/20 0308  Weight: 53.8 kg    Examination:  General exam: Appears calm and comfortable  Respiratory system: Clear to auscultation. Respiratory effort normal. Cardiovascular system: S1 & S2 heard, RRR.  Gastrointestinal system: Abdomen is nondistended, soft and nontender. Central nervous system: Alert and oriented x1. L facial droop.  Extremities: no LEE Skin: No rashes, lesions or ulcers   Data Reviewed: I have personally reviewed following labs and imaging studies  CBC: Recent Labs  Lab 01/16/20 0220 01/16/20 0225 01/19/20 0329  WBC 9.6  --  3.9*  NEUTROABS 7.8*  --   --   HGB 13.8 15.6* 12.7  HCT 45.4 46.0 40.4  MCV 97.2  --  92.2  PLT 257   --  194    Basic Metabolic Panel: Recent Labs  Lab 01/16/20 0220 01/16/20 0225 01/16/20 0507 01/19/20 0329  NA 141 143 143 140  K 3.5 3.4* 3.7 3.1*  CL 107 108 107 112*  CO2 17*  --  20* 21*  GLUCOSE 244* 221* 141* 117*  BUN 16 18 16 9   CREATININE 1.50* 1.30* 1.30* 0.71  CALCIUM 10.8*  --  10.8* 9.7  MG  --   --   --  1.7    GFR: Estimated Creatinine Clearance: 40.7 mL/min (by C-G formula based on SCr of 0.71 mg/dL).  Liver Function Tests: Recent Labs  Lab 01/16/20 0220  AST 33  ALT 19  ALKPHOS 101  BILITOT 0.7  PROT 7.1  ALBUMIN 4.3    CBG: Recent Labs  Lab 01/18/20 2006 01/19/20 0015 01/19/20 0414 01/19/20 0836 01/19/20 1131  GLUCAP 94 116* 109* 122* 111*     Recent Results (from the past 240 hour(s))  SARS Coronavirus  2 by RT PCR (hospital order, performed in [facility] hospital lab) Nasopharyngeal Nasopharyngeal Swab     Status: None   Collection Time: 01/16/20  5:25 AM   Specimen: Nasopharyngeal Swab  Result Value Ref Range Status   SARS Coronavirus 2 NEGATIVE NEGATIVE Final    Comment: (NOTE) SARS-CoV-2 target nucleic acids are NOT DETECTED.  The SARS-CoV-2 RNA is generally detectable in upper and lower respiratory specimens during the acute phase of infection. The lowest concentration of SARS-CoV-2 viral copies this assay can detect is 250 copies / mL. Dewaun Kinzler negative result does not preclude SARS-CoV-2 infection and should not be used as the sole basis for treatment or other patient management decisions.  Johnmatthew Solorio negative result may occur with improper specimen collection / handling, submission of specimen other than nasopharyngeal swab, presence of viral mutation(s) within the areas targeted by this assay, and inadequate number of viral copies (<250 copies / mL). Lannis Lichtenwalner negative result must be combined with clinical observations, patient history, and epidemiological information.  Fact Sheet for Patients:    BoilerBrush.com.cy  Fact Sheet for Healthcare Providers: https://pope.com/  This test is not yet approved or  cleared by the [place] FDA and has been authorized for detection and/or diagnosis of SARS-CoV-2 by FDA under an Emergency Use Authorization (EUA).  This EUA will remain in effect (meaning this test can be used) for the duration of the COVID-19 declaration under Section 564(b)(1) of the Act, 21 U.S.C. section 360bbb-3(b)(1), unless the authorization is terminated or revoked sooner.  Performed at [facility], [address], [place], [place] [address]          Radiology Studies: No results found.      Scheduled Meds: .  stroke: mapping our early stages of recovery book   Does not apply Once  . acetaminophen  650 mg Oral BID  . artificial tears   Left Eye QHS  . [START ON 01/20/2020] aspirin  325 mg Per Tube Daily  . clopidogrel  75 mg Oral QPM  . colestipol  2 g Oral QPM  . [START ON 01/20/2020] enoxaparin (LOVENOX) injection  40 mg Subcutaneous Q24H  . simvastatin  40 mg Oral q1800   And  . ezetimibe  10 mg Oral q1800  . free water  100 mL Per Tube Q6H  . oxybutynin  5 mg Oral BID  . pantoprazole  40 mg Oral Daily  . polyvinyl alcohol  1 drop Left Eye QID   Continuous Infusions: . dextrose 5 % and 0.9% NaCl 75 mL/hr at 01/19/20 0457  . feeding supplement (OSMOLITE 1.2 CAL)       LOS: 3 days    Time spent: over 30 min    Lacretia Nicks, MD [facility]   To contact the attending provider between 7A-7P or the covering provider during after hours 7P-7A, please log into the web site [email or web address] and access using universal [place] password for that web site. If you do not have the password, please call the hospital operator.  01/19/2020, 4:05 PM

## 2020-01-19 NOTE — Procedures (Signed)
Cortrak  Person Inserting Tube:  Kendell Bane C, RD Tube Type:  Cortrak - 43 inches Tube Location:  Right nare Initial Placement:  Stomach Secured by: Bridle Technique Used to Measure Tube Placement:  Documented cm marking at nare/ corner of mouth Cortrak Secured At:  62 cm    Cortrak Tube Team Note:  Consult received to place a Cortrak feeding tube.   No x-ray is required. RN may begin using tube.     If the tube becomes dislodged please keep the tube and contact the Cortrak team at [email or web address] (password [email or web address]) for replacement.  If after hours and replacement cannot be delayed, place a NG tube and confirm placement with an abdominal x-ray.    Cammy Copa., RD, LDN, CNSC See AMiON for contact information

## 2020-01-19 NOTE — Progress Notes (Signed)
SLP Cancellation Note  Patient Details Name: Sydney Schultz MRN: [id number] DOB: 1931-11-19   Cancelled treatment:       Reason Eval/Treat Not Completed: Patient at procedure or test/unavailable. Will f/u as able, Recommended Cortrak placement to MD today given probability of limited oral intake.    Lashanta Elbe, Riley Nearing 01/19/2020, 3:04 PM

## 2020-01-19 NOTE — Care Management Important Message (Signed)
Important Message  Patient Details  Name: LAMARIA HILDEBRANDT MRN: [id number] Date of Birth: 1932-02-08   Medicare Important Message Given:  Yes     Dorena Bodo 01/19/2020, 2:50 PM

## 2020-01-20 ENCOUNTER — Inpatient Hospital Stay (HOSPITAL_COMMUNITY): Payer: Medicare PPO

## 2020-01-20 ENCOUNTER — Other Ambulatory Visit: Payer: Self-pay | Admitting: Cardiology

## 2020-01-20 DIAGNOSIS — I639 Cerebral infarction, unspecified: Secondary | ICD-10-CM

## 2020-01-20 LAB — CBC WITH DIFFERENTIAL/PLATELET
Abs Immature Granulocytes: 0.02 10*3/uL (ref 0.00–0.07)
Basophils Absolute: 0 10*3/uL (ref 0.0–0.1)
Basophils Relative: 0 %
Eosinophils Absolute: 0.1 10*3/uL (ref 0.0–0.5)
Eosinophils Relative: 2 %
HCT: 36.8 % (ref 36.0–46.0)
Hemoglobin: 11.6 g/dL — ABNORMAL LOW (ref 12.0–15.0)
Immature Granulocytes: 0 %
Lymphocytes Relative: 8 %
Lymphs Abs: 0.4 10*3/uL — ABNORMAL LOW (ref 0.7–4.0)
MCH: 29.1 pg (ref 26.0–34.0)
MCHC: 31.5 g/dL (ref 30.0–36.0)
MCV: 92.2 fL (ref 80.0–100.0)
Monocytes Absolute: 0.6 10*3/uL (ref 0.1–1.0)
Monocytes Relative: 13 %
Neutro Abs: 3.5 10*3/uL (ref 1.7–7.7)
Neutrophils Relative %: 77 %
Platelets: 226 10*3/uL (ref 150–400)
RBC: 3.99 MIL/uL (ref 3.87–5.11)
RDW: 13.4 % (ref 11.5–15.5)
WBC: 4.5 10*3/uL (ref 4.0–10.5)
nRBC: 0 % (ref 0.0–0.2)

## 2020-01-20 LAB — COMPREHENSIVE METABOLIC PANEL
ALT: 22 U/L (ref 0–44)
AST: 33 U/L (ref 15–41)
Albumin: 2.9 g/dL — ABNORMAL LOW (ref 3.5–5.0)
Alkaline Phosphatase: 81 U/L (ref 38–126)
Anion gap: 7 (ref 5–15)
BUN: 11 mg/dL (ref 8–23)
CO2: 21 mmol/L — ABNORMAL LOW (ref 22–32)
Calcium: 9.5 mg/dL (ref 8.9–10.3)
Chloride: 110 mmol/L (ref 98–111)
Creatinine, Ser: 1.01 mg/dL — ABNORMAL HIGH (ref 0.44–1.00)
GFR calc Af Amer: 59 mL/min — ABNORMAL LOW (ref >60)
GFR calc non Af Amer: 51 mL/min — ABNORMAL LOW (ref >60)
Glucose, Bld: 217 mg/dL — ABNORMAL HIGH (ref 70–99)
Potassium: 3.8 mmol/L (ref 3.5–5.1)
Sodium: 138 mmol/L (ref 135–145)
Total Bilirubin: 1.3 mg/dL — ABNORMAL HIGH (ref 0.3–1.2)
Total Protein: 5.4 g/dL — ABNORMAL LOW (ref 6.5–8.1)

## 2020-01-20 LAB — URINALYSIS, ROUTINE W REFLEX MICROSCOPIC
Bilirubin Urine: NEGATIVE
Glucose, UA: >=500 mg/dL — AB
Ketones, ur: NEGATIVE mg/dL
Nitrite: NEGATIVE
Protein, ur: NEGATIVE mg/dL
Specific Gravity, Urine: 1.017 (ref 1.005–1.030)
pH: 5 (ref 5.0–8.0)

## 2020-01-20 LAB — PHOSPHORUS: Phosphorus: 2.3 mg/dL — ABNORMAL LOW (ref 2.5–4.6)

## 2020-01-20 LAB — GLUCOSE, CAPILLARY
Glucose-Capillary: 123 mg/dL — ABNORMAL HIGH (ref 70–99)
Glucose-Capillary: 123 mg/dL — ABNORMAL HIGH (ref 70–99)
Glucose-Capillary: 130 mg/dL — ABNORMAL HIGH (ref 70–99)
Glucose-Capillary: 148 mg/dL — ABNORMAL HIGH (ref 70–99)
Glucose-Capillary: 182 mg/dL — ABNORMAL HIGH (ref 70–99)
Glucose-Capillary: 196 mg/dL — ABNORMAL HIGH (ref 70–99)
Glucose-Capillary: 96 mg/dL (ref 70–99)

## 2020-01-20 LAB — MAGNESIUM: Magnesium: 1.5 mg/dL — ABNORMAL LOW (ref 1.7–2.4)

## 2020-01-20 MED ORDER — OXYBUTYNIN CHLORIDE 5 MG PO TABS
5.0000 mg | ORAL_TABLET | Freq: Two times a day (BID) | ORAL | Status: DC
  Administered 2020-01-20 – 2020-01-21 (×3): 5 mg
  Filled 2020-01-20 (×3): qty 1

## 2020-01-20 MED ORDER — ACETAMINOPHEN 650 MG RE SUPP: 650.0000 mg | RECTAL | Status: DC | PRN

## 2020-01-20 MED ORDER — CLOPIDOGREL BISULFATE 75 MG PO TABS
75.0000 mg | ORAL_TABLET | Freq: Every evening | ORAL | Status: DC
  Administered 2020-01-20 – 2020-01-21 (×2): 75 mg
  Filled 2020-01-20 (×2): qty 1

## 2020-01-20 MED ORDER — IRBESARTAN 150 MG PO TABS
75.0000 mg | ORAL_TABLET | Freq: Every day | ORAL | Status: DC
  Administered 2020-01-20: 75 mg
  Filled 2020-01-20: qty 1

## 2020-01-20 MED ORDER — ACETAMINOPHEN 325 MG PO TABS: 650.0000 mg | ORAL_TABLET | ORAL | Status: DC | PRN

## 2020-01-20 MED ORDER — MAGNESIUM SULFATE 2 GM/50ML IV SOLN
2.0000 g | Freq: Once | INTRAVENOUS | Status: AC
  Administered 2020-01-20: 2 g via INTRAVENOUS
  Filled 2020-01-20: qty 50

## 2020-01-20 MED ORDER — ACETAMINOPHEN 160 MG/5ML PO SOLN
650.0000 mg | ORAL | Status: DC | PRN
  Administered 2020-01-28: 650 mg

## 2020-01-20 MED ORDER — EZETIMIBE 10 MG PO TABS
10.0000 mg | ORAL_TABLET | Freq: Every day | ORAL | Status: DC
  Administered 2020-01-20 – 2020-01-21 (×2): 10 mg
  Filled 2020-01-20 (×2): qty 1

## 2020-01-20 MED ORDER — SODIUM CHLORIDE 0.9 % IV SOLN
1.0000 g | INTRAVENOUS | Status: AC
  Administered 2020-01-22 – 2020-01-24 (×4): 1 g via INTRAVENOUS
  Filled 2020-01-20 (×4): qty 10

## 2020-01-20 MED ORDER — ACETAMINOPHEN 325 MG PO TABS
650.0000 mg | ORAL_TABLET | Freq: Two times a day (BID) | ORAL | Status: DC
  Administered 2020-01-20 – 2020-01-21 (×3): 650 mg
  Filled 2020-01-20 (×2): qty 2

## 2020-01-20 MED ORDER — PANTOPRAZOLE SODIUM 40 MG PO PACK
40.0000 mg | PACK | Freq: Every day | ORAL | Status: DC
  Administered 2020-01-20 – 2020-02-04 (×16): 40 mg
  Filled 2020-01-20 (×16): qty 20

## 2020-01-20 MED ORDER — LACTATED RINGERS IV SOLN: INTRAVENOUS | Status: AC

## 2020-01-20 MED ORDER — INSULIN ASPART 100 UNIT/ML ~~LOC~~ SOLN
0.0000 [IU] | SUBCUTANEOUS | Status: DC
  Administered 2020-01-20: 1 [IU] via SUBCUTANEOUS
  Administered 2020-01-20: 2 [IU] via SUBCUTANEOUS
  Administered 2020-01-20 – 2020-01-24 (×11): 1 [IU] via SUBCUTANEOUS
  Administered 2020-01-24: 2 [IU] via SUBCUTANEOUS
  Administered 2020-01-24 – 2020-01-25 (×4): 1 [IU] via SUBCUTANEOUS
  Administered 2020-01-25 (×2): 2 [IU] via SUBCUTANEOUS
  Administered 2020-01-26 – 2020-01-29 (×17): 1 [IU] via SUBCUTANEOUS
  Administered 2020-01-30: 2 [IU] via SUBCUTANEOUS
  Administered 2020-01-30 – 2020-01-31 (×5): 1 [IU] via SUBCUTANEOUS
  Administered 2020-01-31: 2 [IU] via SUBCUTANEOUS
  Administered 2020-01-31 – 2020-02-03 (×4): 1 [IU] via SUBCUTANEOUS
  Administered 2020-02-03 – 2020-02-04 (×2): 2 [IU] via SUBCUTANEOUS
  Administered 2020-02-04: 1 [IU] via SUBCUTANEOUS

## 2020-01-20 MED ORDER — COLESTIPOL HCL 1 G PO TABS
2.0000 g | ORAL_TABLET | Freq: Every evening | ORAL | Status: DC
  Administered 2020-01-20 – 2020-01-21 (×2): 2 g
  Filled 2020-01-20 (×2): qty 2

## 2020-01-20 MED ORDER — SIMVASTATIN 20 MG PO TABS
40.0000 mg | ORAL_TABLET | Freq: Every day | ORAL | Status: DC
  Administered 2020-01-20 – 2020-01-21 (×2): 40 mg
  Filled 2020-01-20 (×3): qty 2

## 2020-01-20 MED ORDER — SENNOSIDES-DOCUSATE SODIUM 8.6-50 MG PO TABS
1.0000 | ORAL_TABLET | Freq: Every evening | ORAL | Status: DC | PRN
  Administered 2020-01-21: 1
  Filled 2020-01-20: qty 1

## 2020-01-20 NOTE — Progress Notes (Signed)
Physical Therapy Treatment Patient Details Name: Sydney Schultz MRN: [id number] DOB: [birth year] Today's Date: 01/20/2020    History of Present Illness Pt is a 86 y/o female with PMH of DM, HTN, OA; presenting with AMS and progressive decline in ADLS, mobility then L facial droop/slurred speech 9/4. MRI reveals acute lower pontine, R PCA territory infarct, L thalamic infarcts- embolic pattern.     PT Comments    Patient received in bed sleeping. Daughter present in room, reports she has been very lethargic today. Patient opens eyes to name. Answers some questions. Patient requires total assist for bed mobility, sitting balance, transfers at this time. Attempted standing with RW. She is unable to get fully standing, therefore patient assisted with squat pivot to recliner with +2 total assist. She is has limited initiation of mobility and is quite lethargic. She will continue to benefit from skilled PT while here to improve functional mobility and strength to reduce caregiver burden.        Follow Up Recommendations  Supervision/Assistance - 24 hour;Home health PT     Equipment Recommendations  Other (comment) (hoyer lift)    Recommendations for Other Services       Precautions / Restrictions Precautions Precautions: Fall Restrictions Weight Bearing Restrictions: No    Mobility  Bed Mobility Overal bed mobility: Needs Assistance Bed Mobility: Supine to Sit     Supine to sit: Total assist;HOB elevated;+2 for physical assistance     General bed mobility comments: total assist with minimal initation of BLEs towards EOB   Transfers Overall transfer level: Needs assistance Equipment used: Standard walker;None Transfers: Sit to/from [employer] Sit to Stand: Total assist;+2 physical assistance;+2 safety/equipment Stand pivot transfers: Total assist;+2 physical assistance;+2 safety/equipment Squat pivot transfers: Total assist;+2 physical assistance;+2  safety/equipment     General transfer comment: total assist +2 to power up from EOB with cueing for hand placement and technqiue using walker. Patient unable to get fully standing. Therefore performed squat pivot transfer to recliner with +2 total assist.  Ambulation/Gait             General Gait Details: Unable to progress to gait training   Stairs             Wheelchair Mobility    Modified Rankin (Stroke Patients Only) Modified Rankin (Stroke Patients Only) Pre-Morbid Rankin Score: Moderately severe disability Modified Rankin: Severe disability     Balance Overall balance assessment: Needs assistance Sitting-balance support: Feet supported Sitting balance-Leahy Scale: Zero Sitting balance - Comments: unable to maintain sitting balance without external support this date. Posterior and lateral leaning to left. Postural control: Posterior lean;Left lateral lean Standing balance support: During functional activity;Bilateral upper extremity supported Standing balance-Leahy Scale: Zero Standing balance comment: unable to stand without total assist +2                            Cognition Arousal/Alertness: Lethargic Behavior During Therapy: Flat affect Overall Cognitive Status: Impaired/Different from baseline Area of Impairment: Following commands;Awareness;Problem solving;Memory;Orientation;Attention;Safety/judgement                 Orientation Level: Disoriented to;Situation;Time Current Attention Level: Focused Memory: Decreased short-term memory Following Commands: Follows one step commands with increased time;Follows multi-step commands inconsistently;Follows one step commands inconsistently Safety/Judgement: Decreased awareness of safety;Decreased awareness of deficits Awareness: Emergent Problem Solving: Slow processing;Decreased initiation;Difficulty sequencing;Requires verbal cues;Requires tactile cues General Comments: Patient is very  lethargic this visit. opens eyes, but  does not really follow commands at all. Slumped posture sitting edge of bed.      Exercises      General Comments General comments (skin integrity, edema, etc.): daughter present and planning to take patient home at discharge      Pertinent Vitals/Pain Pain Assessment: No/denies pain Faces Pain Scale: No hurt    Home Living                      Prior Function            PT Goals (current goals can now be found in the care plan section) Acute Rehab PT Goals Patient Stated Goal: daughter wants to take her home PT Goal Formulation: With family Time For Goal Achievement: 01/31/20 Potential to Achieve Goals: Fair Progress towards PT goals: Not progressing toward goals - comment (due to lethargy this date)    Frequency    Min 3X/week      PT Plan Discharge plan needs to be updated    Co-evaluation PT/OT/SLP Co-Evaluation/Treatment: Yes Reason for Co-Treatment: For patient/therapist safety;To address functional/ADL transfers PT goals addressed during session: Mobility/safety with mobility;Balance        AM-PAC PT "6 Clicks" Mobility   Outcome Measure  Help needed turning from your back to your side while in a flat bed without using bedrails?: Total Help needed moving from lying on your back to sitting on the side of a flat bed without using bedrails?: Total Help needed moving to and from a bed to a chair (including a wheelchair)?: Total Help needed standing up from a chair using your arms (e.g., wheelchair or bedside chair)?: Total Help needed to walk in hospital room?: Total Help needed climbing 3-5 steps with a railing? : Total 6 Click Score: 6    End of Session Equipment Utilized During Treatment: Gait belt Activity Tolerance: Patient limited by fatigue Patient left: in chair;with family/visitor present;with chair alarm set Nurse Communication: Mobility status;Need for lift equipment PT Visit Diagnosis:  Unsteadiness on feet (R26.81);Difficulty in walking, not elsewhere classified (R26.2);Other symptoms and signs involving the nervous system (R29.898);Hemiplegia and hemiparesis;Other abnormalities of gait and mobility (R26.89);Muscle weakness (generalized) (M62.81) Hemiplegia - Right/Left: Left Hemiplegia - dominant/non-dominant: Non-dominant Hemiplegia - caused by: Cerebral infarction     Time: 8182-9937 PT Time Calculation (min) (ACUTE ONLY): 32 min  Charges:  $Therapeutic Activity: 8-22 mins                     Sujata Maines, PT, GCS 01/20/20,12:47 PM

## 2020-01-20 NOTE — Progress Notes (Signed)
Occupational Therapy Treatment Patient Details Name: Sydney Schultz MRN: [id number] DOB: [birth year] Today's Date: 01/20/2020    History of present illness Pt is a 86 y/o female with PMH of DM, HTN, OA; presenting with AMS and progressive decline in ADLS, mobility then L facial droop/slurred speech 9/4. MRI reveals acute lower pontine, R PCA territory infarct, L thalamic infarcts- embolic pattern.    OT comments  Patient supine in bed and agreeable to get OOB, seen in conjunction with PT to optimize pt tolerance and safety.  Patient requires total assist +2 for bed mobility, squat pivot transfers to recliner.  Increased lethargy today, but arousable to engages appropriately but minimally. Nystagmus with scanning (esp towards R side), R gaze preference and increased effort to scan towards L side. She continues to present with L lateral and posterior lean with static sitting and requires anywhere from min-total support.  Total assist for all self care at this time. Will follow acutely.     Follow Up Recommendations  SNF;Supervision/Assistance - 24 hour;Home health OT;Other (comment) (max HH services (OT, aide) family declining SNF )    Equipment Recommendations  Wheelchair (measurements OT);Wheelchair cushion (measurements OT);Other (comment);Hospital bed (hoyer lift )    Recommendations for Other Services Other (comment) (palliative care consult )    Precautions / Restrictions Precautions Precautions: Fall Restrictions Weight Bearing Restrictions: No       Mobility Bed Mobility Overal bed mobility: Needs Assistance Bed Mobility: Supine to Sit     Supine to sit: Total assist;HOB elevated;+2 for physical assistance     General bed mobility comments: total assist with minimal initation of BLEs towards EOB   Transfers Overall transfer level: Needs assistance Equipment used: Rolling walker (2 wheeled);None Transfers: Sit to/from [employer] Sit to Stand: Total  assist;+2 physical assistance;+2 safety/equipment Stand pivot transfers: Total assist;+2 physical assistance;+2 safety/equipment Squat pivot transfers: Total assist;+2 physical assistance;+2 safety/equipment     General transfer comment: total assist +2 to power up from EOB with cueing for hand placement and technqiue using walker. Patient unable to get fully standing. Therefore performed squat pivot transfer to recliner with +2 total assist.    Balance Overall balance assessment: Needs assistance Sitting-balance support: Feet supported Sitting balance-Leahy Scale: Poor Sitting balance - Comments: unable to maintain sitting balance without external support this date. Posterior and lateral leaning to left. Postural control: Posterior lean;Left lateral lean Standing balance support: During functional activity;Bilateral upper extremity supported Standing balance-Leahy Scale: Zero Standing balance comment: unable to stand without total assist +2                           ADL either performed or assessed with clinical judgement   ADL Overall ADL's : Needs assistance/impaired                                     Functional mobility during ADLs: Total assistance;+2 for physical assistance;+2 for safety/equipment General ADL Comments: total assist     Vision   Additional Comments: continues to present with nystagmus when scanning towards R side,  increased cueing to scan towards L side with R gaze preference.    Perception     Praxis      Cognition Arousal/Alertness: Lethargic Behavior During Therapy: Flat affect Overall Cognitive Status: Impaired/Different from baseline Area of Impairment: Following commands;Awareness;Problem solving;Memory;Attention;Safety/judgement  Orientation Level: Disoriented to;Situation;Time Current Attention Level: Focused Memory: Decreased short-term memory Following Commands: Follows one step commands  inconsistently;Follows one step commands with increased time Safety/Judgement: Decreased awareness of safety;Decreased awareness of deficits Awareness: Emergent Problem Solving: Slow processing;Decreased initiation;Difficulty sequencing;Requires verbal cues;Requires tactile cues General Comments: patient with some awareness of deficits, but lethargic and requires max mulitmodal cueing throughout session, following minimal commands.         Exercises     Shoulder Instructions       General Comments daughter present and reports plan change to dc home     Pertinent Vitals/ Pain       Pain Assessment: No/denies pain Faces Pain Scale: No hurt  Home Living                                          Prior Functioning/Environment              Frequency  Min 2X/week        Progress Toward Goals  OT Goals(current goals can now be found in the care plan section)  Progress towards OT goals: Not progressing toward goals - comment (lethargic )  Acute Rehab OT Goals Patient Stated Goal: daughter wants to take her home  Plan Discharge plan needs to be updated;Frequency remains appropriate    Co-evaluation    PT/OT/SLP Co-Evaluation/Treatment: Yes Reason for Co-Treatment: For patient/therapist safety;To address functional/ADL transfers PT goals addressed during session: Mobility/safety with mobility;Balance OT goals addressed during session: ADL's and self-care      AM-PAC OT "6 Clicks" Daily Activity     Outcome Measure   Help from another person eating meals?: Total Help from another person taking care of personal grooming?: Total Help from another person toileting, which includes using toliet, bedpan, or urinal?: Total Help from another person bathing (including washing, rinsing, drying)?: Total Help from another person to put on and taking off regular upper body clothing?: Total Help from another person to put on and taking off regular lower body  clothing?: Total 6 Click Score: 6    End of Session Equipment Utilized During Treatment: Gait belt  OT Visit Diagnosis: Other abnormalities of gait and mobility (R26.89);Muscle weakness (generalized) (M62.81);Other symptoms and signs involving cognitive function;Other symptoms and signs involving the nervous system (R29.898)   Activity Tolerance Patient limited by lethargy   Patient Left in chair;with call bell/phone within reach;with chair alarm set;with family/visitor present;with nursing/sitter in room   Nurse Communication Mobility status        Time: 0160-1093 OT Time Calculation (min): 32 min  Charges: OT General Charges $OT Visit: 1 Visit OT Treatments $Self Care/Home Management : 8-22 mins  Barry Brunner, OT Acute Rehabilitation Services Pager [phone] Office [phone]    Chancy Milroy 01/20/2020, 12:59 PM

## 2020-01-20 NOTE — Progress Notes (Signed)
PROGRESS NOTE    Sydney Schultz  NAT:[id number] DOB: 09/03/1931 DOA: 01/16/2020 PCP: Jaclyn Shaggy, MD   Chief Complaint  Patient presents with  . Code Stroke    Brief Narrative:  Sydney Schultz 86 y.o.femalewith medical history significant ofDM, HTN, OSA and OA. She has been followed by neurology at [facility] with last OV 02/17/19 for f/u of evaluation for change in consciousness episode. Evaluation was non-focal. MRI at that time revealed cerebral atrophy and 2 old lacuanr infarcts right cerebral hemisphere. Patient has had progressive decline and prior to the precipitating event required assistance with dressing, toileting and mobility. On 01/15/20 she had increased weakness and around 5 PM was noted to hve developed Sydney Schultz left facial droop and slurred speech. EMS was called and patient was brought to [facility] for further evaluation.  MRI brain:  1. Acute right PCA territory infarct, predominantly involving the temporal and occipital cortex. 2. Small acute infarct of the left thalamus.  Assessment & Plan:   Principal Problem:   CVA (cerebral vascular accident) (HCC) Active Problems:   DM (diabetes mellitus) (HCC)   HTN (hypertension)   GERD (gastroesophageal reflux disease)   OSA (obstructive sleep apnea)   Malnutrition of moderate degree  CVA (cerebral vascular accident) - possibly embolic MRI with acute R PCA territory infarct predominantly involving the temporal and occipital cortex.  Small acute infarct of L thalamus.  Short segment occlusion of the R P2 segment with distal reconstitution demonstrated on earlier CTA.  Severe chronic microangiopathy. - this morning less interactive, more lethargic, progressive LUE weakness - discussed with neurology, repeat MRI has been ordered - per neurology, embolic pattern, source unclear - recommending 30 day cardiac event monitor to r/o atrial fibrillation (msg sent to card master to help arrange follow up) -  LDL 131, Cholesterol 209 - zocor 40 + zetia 10 - A1c 5.6 - Continue DAPT x 3 months and then plavix alone - therapy recommending SNF, but family wants her to go home - per SLP, not ready for PO diet, recommending spoonfulls of water and meds crushed with puree  - will place cortrak and follow progress with speech through the weekend.  If she's not improving, will need to discuss PEG early next week (discussed with neurology).   - for incomplete eyelid closure on L, QID eyedrops and nightly ointment  Dysphagia: cortrak placed 9/8, follow progress with speech prior to deciding on peg.    Nausea  Vomiting: in setting of tube feeding above, will hold for now, if symptoms improved, can resume tube feeds at previously tolerated rate later this afternoon  Acute Metabolic Encephalopathy: ? Unresponsive spells per neurology note.  Following with Dr. Malvin Johns and NP Morrie Sheldon at [place] [facility].  Continue outpatient follow up. Progressive lethargy today, follow repeat MRI  DM (diabetes mellitus)  - a1c 5.6 - SSI    HTN (hypertension) - gradually restart home meds (start with irbesartan low dose, continue to hold HCTZ)   Dementia - continue aricept  CKD3 - stable  Cough: noted at bedside.  CXR without focal infiltrate.  Will continue to monitor.  Follow with speech.  DVT prophylaxis: lovenox Code Status:DNR Family Communication: daughter at bedside Disposition:   Status is: Inpatient  Remains inpatient appropriate because:Inpatient level of care appropriate due to severity of illness   Dispo: The patient is from: Home              Anticipated d/c is to: pending, likely  home, but SNF recommended              Anticipated d/c date is: > 3 days              Patient currently is not medically stable to d/c.   Consultants:   neurology  Procedures:  IMPRESSIONS    1. Left ventricular ejection fraction, by estimation, is 60 to 65%. The  left ventricle has normal function. The left  ventricle has no regional  wall motion abnormalities. Left ventricular diastolic parameters are  consistent with Grade II diastolic  dysfunction (pseudonormalization). Elevated left ventricular end-diastolic  pressure.  2. Right ventricular systolic function is normal. The right ventricular  size is normal. There is moderately elevated pulmonary artery systolic  pressure. The estimated right ventricular systolic pressure is 60.8 mmHg.  3. Left atrial size was severely dilated.  4. The mitral valve is normal in structure. Mild mitral valve  regurgitation. Moderate mitral stenosis. The mean mitral valve gradient is  8.0 mmHg.  5. Tricuspid valve regurgitation is moderate.  6. The aortic valve is tricuspid. Aortic valve regurgitation is not  visualized. Moderate aortic valve stenosis. Aortic valve mean gradient  measures 31.0 mmHg. Aortic valve Vmax measures 3.56 m/s.  7. The inferior vena cava is normal in size with greater than 50%  respiratory variability, suggesting right atrial pressure of 3 mmHg.   Antimicrobials:  Anti-infectives (From admission, onward)   None     Subjective: Sydney Schultz&Ox1. More lethargic today  Objective: Vitals:   01/16/20 0308 01/20/20 0346 01/20/20 0722 01/20/20 1119  BP:  (!) 121/53 (!) 109/53 (!) 108/55  Pulse:  75 79 77  Resp:  17 18 18   Temp:  99.7 F (37.6 C) 99.7 F (37.6 C) 98.1 F (36.7 C)  TempSrc:  Oral Oral Oral  SpO2:  100% 98% 100%  Weight: 53.8 kg     Height: 5\' 2"  (1.575 m)       Intake/Output Summary (Last 24 hours) at 01/20/2020 1545 Last data filed at 01/19/2020 2318 Gross per 24 hour  Intake --  Output 500 ml  Net -500 ml   Filed Weights   01/16/20 0308  Weight: 53.8 kg    Examination:  General: No acute distress. Cardiovascular: Heart sounds show Sydney Schultz regular rate, and rhythm. Lungs: Clear to auscultation Abdomen: Soft, nontender, nondistended Neurological: Alert and oriented 1. LUE weakness.   Skin: Warm and dry. No  rashes or lesions. Extremities: No clubbing or cyanosis. No edema.   Data Reviewed: I have personally reviewed following labs and imaging studies  CBC: Recent Labs  Lab 01/16/20 0220 01/16/20 0225 01/19/20 0329 01/20/20 0403  WBC 9.6  --  3.9* 4.5  NEUTROABS 7.8*  --   --  3.5  HGB 13.8 15.6* 12.7 11.6*  HCT 45.4 46.0 40.4 36.8  MCV 97.2  --  92.2 92.2  PLT 257  --  194 226    Basic Metabolic Panel: Recent Labs  Lab 01/16/20 0220 01/16/20 0225 01/16/20 0507 01/19/20 0329 01/20/20 0403  NA 141 143 143 140 138  K 3.5 3.4* 3.7 3.1* 3.8  CL 107 108 107 112* 110  CO2 17*  --  20* 21* 21*  GLUCOSE 244* 221* 141* 117* 217*  BUN 16 18 16 9 11   CREATININE 1.50* 1.30* 1.30* 0.71 1.01*  CALCIUM 10.8*  --  10.8* 9.7 9.5  MG  --   --   --  1.7 1.5*  PHOS  --   --   --   --  2.3*    GFR: Estimated Creatinine Clearance: 32.2 mL/min (Myonna Chisom) (by C-G formula based on SCr of 1.01 mg/dL (H)).  Liver Function Tests: Recent Labs  Lab 01/16/20 0220 01/20/20 0403  AST 33 33  ALT 19 22  ALKPHOS 101 81  BILITOT 0.7 1.3*  PROT 7.1 5.4*  ALBUMIN 4.3 2.9*    CBG: Recent Labs  Lab 01/19/20 2317 01/20/20 0345 01/20/20 0723 01/20/20 1120 01/20/20 1543  GLUCAP 123* 196* 182* 148* 130*     Recent Results (from the past 240 hour(s))  SARS Coronavirus 2 by RT PCR (hospital order, performed in [facility] hospital lab) Nasopharyngeal Nasopharyngeal Swab     Status: None   Collection Time: 01/16/20  5:25 AM   Specimen: Nasopharyngeal Swab  Result Value Ref Range Status   SARS Coronavirus 2 NEGATIVE NEGATIVE Final    Comment: (NOTE) SARS-CoV-2 target nucleic acids are NOT DETECTED.  The SARS-CoV-2 RNA is generally detectable in upper and lower respiratory specimens during the acute phase of infection. The lowest concentration of SARS-CoV-2 viral copies this assay can detect is 250 copies / mL. Ellianne Gowen negative result does not preclude SARS-CoV-2 infection and should not be used as the  sole basis for treatment or other patient management decisions.  Briceyda Abdullah negative result may occur with improper specimen collection / handling, submission of specimen other than nasopharyngeal swab, presence of viral mutation(s) within the areas targeted by this assay, and inadequate number of viral copies (<250 copies / mL). Kamilla Hands negative result must be combined with clinical observations, patient history, and epidemiological information.  Fact Sheet for Patients:   BoilerBrush.com.cy  Fact Sheet for Healthcare Providers: https://pope.com/  This test is not yet approved or  cleared by the [place] FDA and has been authorized for detection and/or diagnosis of SARS-CoV-2 by FDA under an Emergency Use Authorization (EUA).  This EUA will remain in effect (meaning this test can be used) for the duration of the COVID-19 declaration under Section 564(b)(1) of the Act, 21 U.S.C. section 360bbb-3(b)(1), unless the authorization is terminated or revoked sooner.  Performed at [facility], [address], [place], [place] [address]          Radiology Studies: DG CHEST PORT 1 VIEW  Result Date: 01/20/2020 CLINICAL DATA:  Cough. EXAM: PORTABLE CHEST 1 VIEW COMPARISON:  01/16/2020. FINDINGS: Feeding tube noted with tip below left hemidiaphragm. Mediastinum and hilar structures normal. Low lung volumes. No focal infiltrate identified. No pleural effusion or pneumothorax. Heart size stable. Mitral annular calcification noted. Coronary stent noted. Carotid calcification noted. Postsurgical changes right shoulder. Stable deformity and severe degenerative changes left shoulder. IMPRESSION: 1.  Feeding tube noted with tip below left hemidiaphragm. 2.  Low lung volumes.  No focal infiltrate identified. 3. Coronary stent noted. Carotid vascular calcification consistent with atherosclerotic vascular disease noted. Electronically Signed   By: Maisie Fus   Register   On: 01/20/2020 10:16        Scheduled Meds: .  stroke: mapping our early stages of recovery book   Does not apply Once  . acetaminophen  650 mg Per Tube BID  . artificial tears   Left Eye QHS  . aspirin  325 mg Per Tube Daily  . clopidogrel  75 mg Per Tube QPM  . colestipol  2 g Per Tube QPM  . enoxaparin (LOVENOX) injection  40 mg Subcutaneous Q24H  . simvastatin  40 mg Per Tube q1800   And  . ezetimibe  10 mg Per  Tube q1800  . free water  100 mL Per Tube Q6H  . insulin aspart  0-9 Units Subcutaneous Q4H  . oxybutynin  5 mg Per Tube BID  . pantoprazole sodium  40 mg Per Tube Daily  . polyvinyl alcohol  1 drop Left Eye QID   Continuous Infusions: . feeding supplement (OSMOLITE 1.2 CAL) Stopped (01/20/20 1445)     LOS: 4 days    Time spent: over 30 min    Lacretia Nicksaldwell Powell, MD [facility]   To contact the attending provider between 7A-7P or the covering provider during after hours 7P-7A, please log into the web site [email or web address] and access using universal [place] password for that web site. If you do not have the password, please call the hospital operator.  01/20/2020, 3:45 PM

## 2020-01-20 NOTE — Progress Notes (Signed)
SLP Cancellation Note  Patient Details Name: INGE WALDROUP MRN: [id number] DOB: 03/14/1932   Cancelled treatment:       Reason Eval/Treat Not Completed: Medical issues which prohibited therapy. Pt with changes to her mentation this morning. MD considering repeat imaging, reconsulting neurology. He requests we hold PO trials this morning. Will f/u as able.   Mahala Menghini., M.A. CCC-SLP Acute Rehabilitation Services Pager [phone] Office [phone]  01/20/2020, 9:02 AM

## 2020-01-20 NOTE — Progress Notes (Signed)
Increased tube feeding rate this morning to 50 mL/hr at 0951. Patient had vomiting episode at 1500, emesis looked like tube feed. Tube feeding was stopped and Dr. Lowell Guitar was notified. He agreed to hold tube feeding for now and asked to check on MRI that's supposed to be scheduled for today.

## 2020-01-20 NOTE — Progress Notes (Signed)
STROKE TEAM PROGRESS NOTE   INTERVAL HISTORY Daughter at bedside. Pt lying in bed, more drowsy and lethargic than yesterday but still arousable but not orientated. Not quite cooperative with exam but seems to have left UE weaker then right UE, will repeat limited MRI. Pt had cortrak yesterday and now on TF and free water. CXR neg, pending UA, no fever, no leukocytosis. She did have relatively low BP and slightly elevated Cre.   OBJECTIVE Vitals:   01/16/20 0308 01/20/20 0346 01/20/20 0722 01/20/20 1119  BP:  (!) 121/53 (!) 109/53 (!) 108/55  Pulse:  75 79 77  Resp:  17 18 18   Temp:  99.7 F (37.6 C) 99.7 F (37.6 C) 98.1 F (36.7 C)  TempSrc:  Oral Oral Oral  SpO2:  100% 98% 100%  Weight: 53.8 kg     Height: 5\' 2"  (1.575 m)      CBC:  Recent Labs  Lab 01/16/20 0220 01/16/20 0225 01/19/20 0329 01/20/20 0403  WBC 9.6   < > 3.9* 4.5  NEUTROABS 7.8*  --   --  3.5  HGB 13.8   < > 12.7 11.6*  HCT 45.4   < > 40.4 36.8  MCV 97.2   < > 92.2 92.2  PLT 257   < > 194 226   < > = values in this interval not displayed.   Basic Metabolic Panel:  Recent Labs  Lab 01/19/20 0329 01/20/20 0403  NA 140 138  K 3.1* 3.8  CL 112* 110  CO2 21* 21*  GLUCOSE 117* 217*  BUN 9 11  CREATININE 0.71 1.01*  CALCIUM 9.7 9.5  MG 1.7 1.5*  PHOS  --  2.3*   Lipid Panel:     Component Value Date/Time   CHOL 209 (H) 01/16/2020 0500   TRIG 68 01/16/2020 0500   HDL 64 01/16/2020 0500   CHOLHDL 3.3 01/16/2020 0500   VLDL 14 01/16/2020 0500   LDLCALC 131 (H) 01/16/2020 0500   HgbA1c:  Lab Results  Component Value Date   HGBA1C 5.6 01/16/2020   Urine Drug Screen: No results found for: LABOPIA, COCAINSCRNUR, LABBENZ, AMPHETMU, THCU, LABBARB  Alcohol Level No results found for: ETH  IMAGING  CT Code Stroke CTA Head W/WO contrast CT Code Stroke CTA Neck W/WO contrast 01/16/2020   ADDENDUM: Dr. 03/17/2020 called for clarification of an arterial stenosis reference in a subsequent brain MRI report.  The patient's right PCA territory infarct is associated with a severe P2 segment stenosis with flow gap. There is also a flow reducing left P1 segment stenosis which is ameliorated by a posterior communicating artery.  IMPRESSION:  1. No intracranial arterial occlusion or high-grade stenosis.  2. Approximately 50% stenosis of the proximal left internal carotid artery secondary to mixed density atherosclerosis.  Aortic Atherosclerosis (ICD10-I70.0).   MR BRAIN WO CONTRAST 01/16/2020 MPRESSION:  1. Acute right PCA territory infarct, predominantly involving the temporal and occipital cortex.  2. Small acute infarct of the left thalamus.  3. Short segment occlusion of the right P2 segment with distal reconstitution demonstrated on earlier CTA.  4. Severe chronic ischemic microangiopathy.   CT HEAD CODE STROKE WO CONTRAST 01/16/2020 MPRESSION:  1. No acute intracranial abnormality.  2. ASPECTS is 10.   DG Chest 2 View 01/16/2020 IMPRESSION:  No evidence of acute cardiopulmonary disease.   Transthoracic Echocardiogram  1. Left ventricular ejection fraction, by estimation, is 60 to 65%. The left ventricle has normal function. The left ventricle has no  regional wall motion abnormalities. Left ventricular diastolic parameters are consistent with Grade II diastolic dysfunction (pseudonormalization). Elevated left ventricular end-diastolic pressure.  2. Right ventricular systolic function is normal. The right ventricular size is normal. There is moderately elevated pulmonary artery systolic pressure. The estimated right ventricular systolic pressure is 60.8 mmHg.  3. Left atrial size was severely dilated.  4. The mitral valve is normal in structure. Mild mitral valve regurgitation. Moderate mitral stenosis. The mean mitral valve gradient is 8.0 mmHg.  5. Tricuspid valve regurgitation is moderate.  6. The aortic valve is tricuspid. Aortic valve regurgitation is not visualized. Moderate aortic valve  stenosis. Aortic valve mean gradient measures 31.0 mmHg. Aortic valve Vmax measures 3.56 m/s.  7. The inferior vena cava is normal in size with greater than 50% respiratory variability, suggesting right atrial pressure of 3 mmHg.   Bilateral Lower Extremity Venous Dopplers   RIGHT:  - Findings appear essentially unchanged compared to previous examination.  - There is no evidence of deep vein thrombosis in the lower extremity.   LEFT:  - Findings appear essentially unchanged compared to previous examination.  - There is no evidence of deep vein thrombosis in the lower extremity.   ECG - SR rate 83 BPM. (See cardiology reading for complete details)  PHYSICAL EXAM    Temp:  [97.3 F (36.3 C)-99.7 F (37.6 C)] 98.1 F (36.7 C) (09/09 1119) Pulse Rate:  [71-79] 77 (09/09 1119) Resp:  [17-20] 18 (09/09 1119) BP: (108-161)/(53-89) 108/55 (09/09 1119) SpO2:  [98 %-100 %] 100 % (09/09 1119)  General - Well nourished, well developed, in no apparent distress, sleepy.  Ophthalmologic - fundi not visualized due to noncooperation.  Cardiovascular - Regular rhythm and rate.  Neuro - sleepy but arousable, needed repetitive stimulation to keep eye opening, not orientated to place, people or age today. Mild dysarthria, paucity of speech, limited answers for question, not cooperative on naming or repetition.  Bilateral gaze nystagmus but improved from previous, bilateral directions. Disconjugate eyes, with left eye upward deviation. PERRL. Not consistently blinking to visual threat to the left.  Left complete facial palsy with decreased left frontal ring toes, left watery eye, decreased left eye blinking, significanct left facial droop, however left palpebral fissure similar to or even smaller than the right.  Tongue midline.  Bilateral upper extremity drift 3-/5 proximal but RUE bicep 3/5 but LUE bicep not against gravity, bilateral lower extremity 2/5 proximal and 3/5 knee flexion with pain  stimulation.  Sensation subjectively symmetrical, but coordination not corporative.  Gait not tested.   ASSESSMENT/PLAN Sydney Schultz is a 86 y.o. female with history of  "episodes" which previously been attributed to diabetes, TIAs, "sleep attacks"DM, HTN, CAD (s/p stent) and OSA, presents with confusion and shaking. She did not receive IV t-PA due to late presentation (>4.5 hours from time of onset).  Stroke: acute right PCA, right lower pontine, left thalamic infarcts - embolic pattern, source unclear.  Code Stroke CT Head -  No acute intracranial abnormality. ASPECTS is 10.     CTA H&N - severe R P2 segment stenosis with flow gap. There is also a flow reducing left P1 segment stenosis which is ameliorated by a posterior communicating artery.  MRI head - Acute right PCA territory infarct, predominantly involving the temporal and occipital cortex. Small acute infarct of the left thalamus. Also left lower pontine infarct.    Limited MRI repeat pending to rule out stroke expansion  LE Venous Dopplers - neg  DVT  2D Echo - EF 60-56%. No source of embolus. LA severely dilated.   Recommend 30-day cardiac event monitoring as outpatient to rule out A. fib  Sars Corona Virus 2 - negative  LDL - 131  HgbA1c - 5.6  VTE prophylaxis - Lovenox  clopidogrel 75 mg daily prior to admission, now on ASA 325 and plavix DAPT for 3 months and then plavix alone given the R P2 high grade stenosis.  Therapy recommendations:  SNF, but family would like to take her home  Disposition:  Pending  Encephalopathy   More lethargic and drowsy 9/9   Disorientation and LUE bicep weaker  Repeat MRI pending  CXR neg, no fever or leukocytosis  BP relatively low and Cre slightly elevated - on TF and FW  UA pending  Close monitoring  Discussed with Dr. Lowell Guitar  Episodes of altered consciousness  Per daughter, she has been having the unresponsive episodes for some time, lasting hours and  resolved. She called it "diabetic episodes", but did not check glucose during the episodes.   Follows with Dr. Malvin Johns and NP Morrie Sheldon at [place] [facility]  Etiology unclear, DDx including vertebrobasilar insufficiency, Lewy body dementia, seizure, syncope, hypoglycemia episode  Continue to follow up with Dr. Malvin Johns and NP Morrie Sheldon  Hypertension  Home BP meds: irbesartan ; HCTZ  Current BP meds: avapro 75   Stable on the low end  Will d/c avapro . Long-term BP goal normotensive  Hyperlipidemia  Home Lipid lowering medication: none (Vytorin in the past)  LDL 131, goal < 70  Now on zocor 40 and zetia 10  Continue statin at discharge  Diabetes  Home diabetic meds: Jardiance  Current diabetic meds: SSI   HgbA1c 5.6, goal < 7.0  Hypoglycemic episode 9/7 - now on TF  CBG monitoring  PCP follow up  Dysphagia . Secondary to stroke . Failed swallow evaluation  . NPO  . Speech on board . on cortrak for TF and FW . Daughter does not want to rush for decision on PEG now, but would like to revisit it if pt swallow not getting better for the next several days.  Other Stroke Risk Factors  Advanced age  Coronary artery disease  Obstructive sleep apnea, on CPAP at home  Other Active Problems  Code status - DNR  Dementia on aricept CKD - stage  3b - creatinine - 1.50->1.30->1.30-> 0.71->1.01 GERD  Hospital day # 4  Patient condition worsened within the last 24 hours, has developed worsening mental status, more lethargic and concerning for LUE weakness, and I have ordered UA and MRI repeat. I discussed with Dr. Lowell Guitar. I spent  35 minutes in total face-to-face time with the patient, more than 50% of which was spent in counseling and coordination of care, reviewing test results, images and medication, and discussing the diagnosis, treatment plan and potential prognosis. This patient's care requiresreview of multiple databases, neurological assessment, discussion with family,  other specialists and medical decision making of high complexity. I had long discussion with Daughter at bedside, updated pt current condition, treatment plan and potential prognosis, and answered all the questions. She expressed understanding and appreciation.    Marvel Plan, MD PhD Stroke Neurology 01/20/2020 12:38 PM  To contact Stroke Continuity provider, please refer to [email or web address]. After hours, contact General Neurology

## 2020-01-21 LAB — COMPREHENSIVE METABOLIC PANEL
ALT: 27 U/L (ref 0–44)
AST: 36 U/L (ref 15–41)
Albumin: 2.6 g/dL — ABNORMAL LOW (ref 3.5–5.0)
Alkaline Phosphatase: 83 U/L (ref 38–126)
Anion gap: 6 (ref 5–15)
BUN: 15 mg/dL (ref 8–23)
CO2: 23 mmol/L (ref 22–32)
Calcium: 9.7 mg/dL (ref 8.9–10.3)
Chloride: 109 mmol/L (ref 98–111)
Creatinine, Ser: 1.06 mg/dL — ABNORMAL HIGH (ref 0.44–1.00)
GFR calc Af Amer: 55 mL/min — ABNORMAL LOW (ref >60)
GFR calc non Af Amer: 48 mL/min — ABNORMAL LOW (ref >60)
Glucose, Bld: 139 mg/dL — ABNORMAL HIGH (ref 70–99)
Potassium: 3.5 mmol/L (ref 3.5–5.1)
Sodium: 138 mmol/L (ref 135–145)
Total Bilirubin: 0.9 mg/dL (ref 0.3–1.2)
Total Protein: 4.9 g/dL — ABNORMAL LOW (ref 6.5–8.1)

## 2020-01-21 LAB — CBC WITH DIFFERENTIAL/PLATELET
Abs Immature Granulocytes: 0.02 10*3/uL (ref 0.00–0.07)
Basophils Absolute: 0 10*3/uL (ref 0.0–0.1)
Basophils Relative: 1 %
Eosinophils Absolute: 0.2 10*3/uL (ref 0.0–0.5)
Eosinophils Relative: 5 %
HCT: 33.2 % — ABNORMAL LOW (ref 36.0–46.0)
Hemoglobin: 10.8 g/dL — ABNORMAL LOW (ref 12.0–15.0)
Immature Granulocytes: 1 %
Lymphocytes Relative: 18 %
Lymphs Abs: 0.7 10*3/uL (ref 0.7–4.0)
MCH: 30.3 pg (ref 26.0–34.0)
MCHC: 32.5 g/dL (ref 30.0–36.0)
MCV: 93.3 fL (ref 80.0–100.0)
Monocytes Absolute: 0.5 10*3/uL (ref 0.1–1.0)
Monocytes Relative: 12 %
Neutro Abs: 2.6 10*3/uL (ref 1.7–7.7)
Neutrophils Relative %: 63 %
Platelets: 181 10*3/uL (ref 150–400)
RBC: 3.56 MIL/uL — ABNORMAL LOW (ref 3.87–5.11)
RDW: 13.6 % (ref 11.5–15.5)
WBC: 4.1 10*3/uL (ref 4.0–10.5)
nRBC: 0 % (ref 0.0–0.2)

## 2020-01-21 LAB — MAGNESIUM: Magnesium: 2 mg/dL (ref 1.7–2.4)

## 2020-01-21 LAB — GLUCOSE, CAPILLARY
Glucose-Capillary: 133 mg/dL — ABNORMAL HIGH (ref 70–99)
Glucose-Capillary: 104 mg/dL — ABNORMAL HIGH (ref 70–99)
Glucose-Capillary: 108 mg/dL — ABNORMAL HIGH (ref 70–99)
Glucose-Capillary: 126 mg/dL — ABNORMAL HIGH (ref 70–99)
Glucose-Capillary: 115 mg/dL — ABNORMAL HIGH (ref 70–99)

## 2020-01-21 LAB — PHOSPHORUS: Phosphorus: 2.5 mg/dL (ref 2.5–4.6)

## 2020-01-21 MED ORDER — OXYBUTYNIN CHLORIDE 5 MG PO TABS: 5.0000 mg | ORAL_TABLET | Freq: Two times a day (BID) | ORAL | Status: DC

## 2020-01-21 MED ORDER — EZETIMIBE 10 MG PO TABS: 10.0000 mg | ORAL_TABLET | Freq: Every day | ORAL | Status: DC

## 2020-01-21 MED ORDER — SIMVASTATIN 20 MG PO TABS: 40.0000 mg | ORAL_TABLET | Freq: Every day | ORAL | Status: DC

## 2020-01-21 MED ORDER — SENNOSIDES-DOCUSATE SODIUM 8.6-50 MG PO TABS: 1.0000 | ORAL_TABLET | Freq: Every evening | ORAL | Status: DC | PRN

## 2020-01-21 MED ORDER — COLESTIPOL HCL 1 G PO TABS: 2.0000 g | ORAL_TABLET | Freq: Every evening | ORAL | Status: DC

## 2020-01-21 MED ORDER — ASPIRIN 325 MG PO TABS: 325.0000 mg | ORAL_TABLET | Freq: Every day | ORAL | Status: DC

## 2020-01-21 MED ORDER — CLOPIDOGREL BISULFATE 75 MG PO TABS: 75.0000 mg | ORAL_TABLET | Freq: Every evening | ORAL | Status: DC

## 2020-01-21 MED ORDER — DEXTROSE IN LACTATED RINGERS 5 % IV SOLN: INTRAVENOUS | Status: AC

## 2020-01-21 MED ORDER — ACETAMINOPHEN 325 MG PO TABS: 650.0000 mg | ORAL_TABLET | Freq: Two times a day (BID) | ORAL | Status: DC

## 2020-01-21 NOTE — Progress Notes (Signed)
Physical Therapy Treatment Patient Details Name: Sydney Schultz MRN: [id number] DOB: 23-Jul-1931 Today's Date: 01/21/2020    History of Present Illness Pt is a 86 y/o female with PMH of DM, HTN, OA; presenting with AMS and progressive decline in ADLS, mobility then L facial droop/slurred speech 9/4. MRI reveals acute lower pontine, R PCA territory infarct, L thalamic infarcts- embolic pattern.    PT Comments    Patient received in bed, daughter,cynthia, present. Patient much more alert, talkative, responsive this day. She is agreeable to PT session. Requires max +2 for supine to sit and min/mod assist for sitting balance due to posterior lean. Requires cues to come forward, but is unable to do so without assistance. With +2 max hand held assist patient is able to stand and take a step or 2 toward recliner. Fatigued easily and requires max +2 assist to safely get seated in recliner. She will continue to benefit from skilled PT while here to improve functional independence and strength.      Follow Up Recommendations  Supervision/Assistance - 24 hour;Home health PT     Equipment Recommendations  Other (comment) (hoyer)    Recommendations for Other Services       Precautions / Restrictions Precautions Precautions: Fall Restrictions Weight Bearing Restrictions: No    Mobility  Bed Mobility Overal bed mobility: Needs Assistance Bed Mobility: Supine to Sit     Supine to sit: Max assist;+2 for physical assistance     General bed mobility comments: improved initiation this date, but continues to require max +2 for supine to sit  Transfers Overall transfer level: Needs assistance Equipment used: 2 person hand held assist Transfers: Sit to/from [employer] Sit to Stand: Max assist;+2 physical assistance Stand pivot transfers: Max assist;+2 physical assistance       General transfer comment: Patient able to bear weight on B LEs and take 1-2 steps toward recliner.  Difficulty advancing L LE. Fatigues quickly and required max +2 assist to safely land on recliner  Ambulation/Gait Ambulation/Gait assistance: Max assist;+2 physical assistance Gait Distance (Feet): 2 Feet Assistive device: 2 person hand held assist Gait Pattern/deviations: Step-to pattern;Decreased stride length;Decreased step length - left;Decreased weight shift to right;Shuffle Gait velocity: decreased   General Gait Details: poor ability to advance L LE   Stairs             Wheelchair Mobility    Modified Rankin (Stroke Patients Only) Modified Rankin (Stroke Patients Only) Pre-Morbid Rankin Score: Moderately severe disability Modified Rankin: Severe disability     Balance Overall balance assessment: Needs assistance Sitting-balance support: Feet supported;No upper extremity supported Sitting balance-Leahy Scale: Poor Sitting balance - Comments: unable to maintain sitting balance without external support this date. Posterior leaning. Postural control: Posterior lean Standing balance support: Bilateral upper extremity supported;During functional activity Standing balance-Leahy Scale: Poor Standing balance comment: required max +2 assist to stand                            Cognition Arousal/Alertness: Awake/alert Behavior During Therapy: WFL for tasks assessed/performed Overall Cognitive Status: Within Functional Limits for tasks assessed                               Problem Solving: Requires verbal cues;Requires tactile cues General Comments: patient with improved cogniton/level of arousal this visit.      Exercises      General  Comments        Pertinent Vitals/Pain Pain Assessment: No/denies pain    Home Living                      Prior Function            PT Goals (current goals can now be found in the care plan section) Acute Rehab PT Goals Patient Stated Goal: daughter wants to take her home PT Goal  Formulation: With patient Time For Goal Achievement: 01/31/20 Potential to Achieve Goals: Fair Progress towards PT goals: Progressing toward goals    Frequency    Min 3X/week      PT Plan Current plan remains appropriate    Co-evaluation              AM-PAC PT "6 Clicks" Mobility   Outcome Measure  Help needed turning from your back to your side while in a flat bed without using bedrails?: A Lot Help needed moving from lying on your back to sitting on the side of a flat bed without using bedrails?: A Lot Help needed moving to and from a bed to a chair (including a wheelchair)?: Total Help needed standing up from a chair using your arms (e.g., wheelchair or bedside chair)?: A Lot Help needed to walk in hospital room?: Total Help needed climbing 3-5 steps with a railing? : Total 6 Click Score: 9    End of Session Equipment Utilized During Treatment: Gait belt Activity Tolerance: Patient tolerated treatment well;Patient limited by fatigue Patient left: in chair;with call bell/phone within reach;with chair alarm set;with family/visitor present Nurse Communication: Mobility status PT Visit Diagnosis: Unsteadiness on feet (R26.81);Difficulty in walking, not elsewhere classified (R26.2);Other symptoms and signs involving the nervous system (R29.898);Hemiplegia and hemiparesis;Other abnormalities of gait and mobility (R26.89);Muscle weakness (generalized) (M62.81) Hemiplegia - Right/Left: Left Hemiplegia - dominant/non-dominant: Non-dominant Hemiplegia - caused by: Cerebral infarction     Time: 1000-1027 PT Time Calculation (min) (ACUTE ONLY): 27 min  Charges:  $Therapeutic Activity: 23-37 mins                     Yury Schaus, PT, GCS 01/21/20,12:00 PM

## 2020-01-21 NOTE — Progress Notes (Signed)
PROGRESS NOTE    [name]  SWN:[id number] DOB: Nov 18, 1931 DOA: 01/16/2020 PCP: Jaclyn Shaggy, MD   Chief Complaint  Patient presents with  . Code Stroke    Brief Narrative:  Sydney Broski Bigelowis Sydney Schultz 86 y.o.femalewith medical history significant ofDM, HTN, OSA and OA. She has been followed by neurology at [facility] with last OV 02/17/19 for f/u of evaluation for change in consciousness episode. Evaluation was non-focal. MRI at that time revealed cerebral atrophy and 2 old lacuanr infarcts right cerebral hemisphere. Patient has had progressive decline and prior to the precipitating event required assistance with dressing, toileting and mobility. On 01/15/20 she had increased weakness and around 5 PM was noted to hve developed [place] Mefferd left facial droop and slurred speech. EMS was called and patient was brought to [facility] for further evaluation.  MRI brain:  1. Acute right PCA territory infarct, predominantly involving the temporal and occipital cortex. 2. Small acute infarct of the left thalamus.  Currently with cortrak in place and discharge pending improvement in dysphagia and ability to take PO safely vs possible need for more long term tube feeding.  Plan for reevaluation this coming Monday.  Therapy has recommended SNF, but pt would have to quarantine and sounds like family leaning towards home due to this.  Assessment & Plan:   Principal Problem:   CVA (cerebral vascular accident) (HCC) Active Problems:   DM (diabetes mellitus) (HCC)   HTN (hypertension)   GERD (gastroesophageal reflux disease)   OSA (obstructive sleep apnea)   Malnutrition of moderate degree  CVA (cerebral vascular accident) - possibly embolic MRI with acute R PCA territory infarct predominantly involving the temporal and occipital cortex.  Small acute infarct of L thalamus.  Short segment occlusion of the R P2 segment with distal reconstitution demonstrated on earlier CTA.  Severe chronic  microangiopathy. - 9/9 morning less interactive, more lethargic, progressive LUE weakness - discussed with neurology, repeat MRI without significant interval change - 9/10 am, she's improved sitting up in chair, more alert, exam improved - per neurology, embolic pattern, source unclear - recommending 30 day cardiac event monitor to r/o atrial fibrillation (msg sent to card master to help arrange follow up) - LDL 131, Cholesterol 209 - zocor 40 + zetia 10 - A1c 5.6 - Continue DAPT x 3 months and then plavix alone  - therapy recommending SNF, but family wants her to go home - per SLP, not ready for PO diet, recommending spoonfulls of water and meds crushed with puree  - will place cortrak and follow progress with speech through the weekend.  If she's not improving, will need to discuss PEG early next week (discussed with neurology).   - for incomplete eyelid closure on L, QID eyedrops and nightly ointment  Acute Metabolic Encephalopathy: ? Unresponsive spells per neurology note.  Following with Dr. Malvin Johns and NP Morrie Sheldon at [facility].  Continue outpatient follow up. Lethargy on 9/9 may have been 2/2 UTI Currently on ceftriaxone  Possible UTI: continue ceftriaxone, follow urine culture  Dysphagia: cortrak placed 9/8, unfortunately currently is clogged.  Please contact cortrak team 9/11 if possible.  Hopefully, with improvement, maybe she'll be able to advance her diet after working with speech.   Nausea  Vomiting: resolved  DM (diabetes mellitus)  - a1c 5.6 - SSI    HTN (hypertension) - gradually restart home meds (start with irbesartan low dose, continue to hold HCTZ)   Dementia - continue aricept  CKD3 -  stable  Cough: noted at bedside.  CXR without focal infiltrate.  Will continue to monitor.  Follow with speech.  DVT prophylaxis: lovenox Code Status:DNR Family Communication: daughter at bedside Disposition:   Status is: Inpatient  Remains inpatient appropriate  because:Inpatient level of care appropriate due to severity of illness   Dispo: The patient is from: Home              Anticipated d/c is to: pending, likely home, but SNF recommended              Anticipated d/c date is: > 3 days              Patient currently is not medically stable to d/c.   Consultants:   neurology  Procedures:  IMPRESSIONS    1. Left ventricular ejection fraction, by estimation, is 60 to 65%. The  left ventricle has normal function. The left ventricle has no regional  wall motion abnormalities. Left ventricular diastolic parameters are  consistent with Grade II diastolic  dysfunction (pseudonormalization). Elevated left ventricular end-diastolic  pressure.  2. Right ventricular systolic function is normal. The right ventricular  size is normal. There is moderately elevated pulmonary artery systolic  pressure. The estimated right ventricular systolic pressure is 60.8 mmHg.  3. Left atrial size was severely dilated.  4. The mitral valve is normal in structure. Mild mitral valve  regurgitation. Moderate mitral stenosis. The mean mitral valve gradient is  8.0 mmHg.  5. Tricuspid valve regurgitation is moderate.  6. The aortic valve is tricuspid. Aortic valve regurgitation is not  visualized. Moderate aortic valve stenosis. Aortic valve mean gradient  measures 31.0 mmHg. Aortic valve Vmax measures 3.56 m/s.  7. The inferior vena cava is normal in size with greater than 50%  respiratory variability, suggesting right atrial pressure of 3 mmHg.   Antimicrobials:  Anti-infectives (From admission, onward)   Start     Dose/Rate Route Frequency Ordered Stop   01/20/20 1930  cefTRIAXone (ROCEPHIN) 1 g in sodium chloride 0.9 % 100 mL IVPB        1 g 200 mL/hr over 30 Minutes Intravenous Every 24 hours 01/20/20 1901 01/25/20 1929     Subjective: More alert today, pleasant No complaints  Objective: Vitals:   01/21/20 0400 01/21/20 0731 01/21/20 1148  01/21/20 1612  BP: 125/66 129/68 122/68 107/75  Pulse: 65 66 81 84  Resp: Temp: 98.1 F (36.7 C) 98.5 F (36.9 C) 98.5 F (36.9 C) 98.4 F (36.9 C)  TempSrc: Oral Oral Oral Oral  SpO2: 100% 100% 97% 100%  Weight:      Height:        Intake/Output Summary (Last 24 hours) at 01/21/2020 1940 Last data filed at 01/21/2020 1732 Gross per 24 hour  Intake 1400 ml  Output --  Net 1400 ml   Filed Weights   01/16/20 0308  Weight: 53.8 kg    Examination:  General: No acute distress. Sitting up in chair. Cardiovascular: Heart sounds show Buryl Bamber regular rate, and rhythm.  Lungs: Clear to auscultation bilaterally Abdomen: Soft, nontender, nondistended  Neurological: Alert. Left sided facial droop.  Improved LUE strength today.  Bilateral lower extremity weakness.   Moves all extremities 4.  Skin: Warm and dry. No rashes or lesions. Extremities: No clubbing or cyanosis. No edema.    Data Reviewed: I have personally reviewed following labs and imaging studies  CBC: Recent Labs  Lab 01/16/20 0220 01/16/20 0225 01/19/20 0329  01/20/20 0403 01/21/20 0154  WBC 9.6  --  3.9* 4.5 4.1  NEUTROABS 7.8*  --   --  3.5 2.6  HGB 13.8 15.6* 12.7 11.6* 10.8*  HCT 45.4 46.0 40.4 36.8 33.2*  MCV 97.2  --  92.2 92.2 93.3  PLT 257  --  194 226 181    Basic Metabolic Panel: Recent Labs  Lab 01/16/20 0220 01/16/20 0220 01/16/20 0225 01/16/20 0507 01/19/20 0329 01/20/20 0403 01/21/20 0154  NA 141   < > 143 143 140 138 138  K 3.5   < > 3.4* 3.7 3.1* 3.8 3.5  CL 107   < > 108 107 112* 110 109  CO2 17*  --   --  20* 21* 21* 23  GLUCOSE 244*   < > 221* 141* 117* 217* 139*  BUN 16   < > 18 16 9 11 15   CREATININE 1.50*   < > 1.30* 1.30* 0.71 1.01* 1.06*  CALCIUM 10.8*  --   --  10.8* 9.7 9.5 9.7  MG  --   --   --   --  1.7 1.5* 2.0  PHOS  --   --   --   --   --  2.3* 2.5   < > = values in this interval not displayed.    GFR: Estimated Creatinine Clearance: 30.7 mL/min (Rahkeem Senft)  (by C-G formula based on SCr of 1.06 mg/dL (H)).  Liver Function Tests: Recent Labs  Lab 01/16/20 0220 01/20/20 0403 01/21/20 0154  AST 33 33 36  ALT 19 22 27   ALKPHOS 101 81 83  BILITOT 0.7 1.3* 0.9  PROT 7.1 5.4* 4.9*  ALBUMIN 4.3 2.9* 2.6*    CBG: Recent Labs  Lab 01/20/20 2353 01/21/20 0400 01/21/20 0734 01/21/20 1149 01/21/20 1612  GLUCAP 123* 133* 104* 108* 126*     Recent Results (from the past 240 hour(s))  SARS Coronavirus 2 by RT PCR (hospital order, performed in [facility] hospital lab) Nasopharyngeal Nasopharyngeal Swab     Status: None   Collection Time: 01/16/20  5:25 AM   Specimen: Nasopharyngeal Swab  Result Value Ref Range Status   SARS Coronavirus 2 NEGATIVE NEGATIVE Final    Comment: (NOTE) SARS-CoV-2 target nucleic acids are NOT DETECTED.  The SARS-CoV-2 RNA is generally detectable in upper and lower respiratory specimens during the acute phase of infection. The lowest concentration of SARS-CoV-2 viral copies this assay can detect is 250 copies / mL. Miguelangel Korn negative result does not preclude SARS-CoV-2 infection and should not be used as the sole basis for treatment or other patient management decisions.  Kamsiyochukwu Buist negative result may occur with improper specimen collection / handling, submission of specimen other than nasopharyngeal swab, presence of viral mutation(s) within the areas targeted by this assay, and inadequate number of viral copies (<250 copies / mL). Laylana Gerwig negative result must be combined with clinical observations, patient history, and epidemiological information.  Fact Sheet for Patients:   [facility]  Fact Sheet for Healthcare Providers: 03/17/20  This test is not yet approved or  cleared by the [employer] FDA and has been authorized for detection and/or diagnosis of SARS-CoV-2 by FDA under an Emergency Use Authorization (EUA).  This EUA will remain in effect (meaning  this test can be used) for the duration of the COVID-19 declaration under Section 564(b)(1) of the Act, 21 U.S.C. section 360bbb-3(b)(1), unless the authorization is terminated or revoked sooner.  Performed at [facility], [address]  [place], [place] [address]          Radiology Studies: MR BRAIN WO CONTRAST  Result Date: 01/20/2020 CLINICAL DATA:  Stroke, follow-up. EXAM: MRI HEAD WITHOUT CONTRAST TECHNIQUE: Multiplanar, multiecho pulse sequences of the brain and surrounding structures were obtained without intravenous contrast. COMPARISON:  Brain MRI 01/16/2020 FINDINGS: Peter Daquila limited protocol brain MRI was performed at the ordering provider's request consisting of axial and coronal diffusion-weighted sequences as well as an axial SWI sequence. Not significantly changed in extent, there is predominantly cortical restricted diffusion consistent with early subacute infarction within the right PCA territory affecting the right temporal lobe (including medial right temporal lobe/hippocampus) and right occipital lobe. As before, there is likely mild involvement of the inferior right parietal lobe. Redemonstrated subcentimeter early subacute infarct within the left thalamus. Early subacute infarcts within the left greater than right posterior pontomedullary junction have also not significantly changed in extent. Unchanged mild petechial hemorrhage within portions of the right occipital lobe infarction territory. No interval acute infarct is identified elsewhere. IMPRESSION: No significant interval change in extent of an early subacute right PCA territory infarct predominantly affecting the right temporal and occipital lobes. Unchanged mild petechial hemorrhage within portions of the right occipital lobe infarction territory. Smaller early subacute infarcts within the left thalamus and left greater than right posterior pontomedullary junction have also not significantly changed in extent.  Electronically Signed   By: Jackey Loge DO   On: 01/20/2020 17:19   DG CHEST PORT 1 VIEW  Result Date: 01/20/2020 CLINICAL DATA:  Cough. EXAM: PORTABLE CHEST 1 VIEW COMPARISON:  01/16/2020. FINDINGS: Feeding tube noted with tip below left hemidiaphragm. Mediastinum and hilar structures normal. Low lung volumes. No focal infiltrate identified. No pleural effusion or pneumothorax. Heart size stable. Mitral annular calcification noted. Coronary stent noted. Carotid calcification noted. Postsurgical changes right shoulder. Stable deformity and severe degenerative changes left shoulder. IMPRESSION: 1.  Feeding tube noted with tip below left hemidiaphragm. 2.  Low lung volumes.  No focal infiltrate identified. 3. Coronary stent noted. Carotid vascular calcification consistent with atherosclerotic vascular disease noted. Electronically Signed   By: Maisie Fus  Register   On: 01/20/2020 10:16        Scheduled Meds: .  stroke: mapping our early stages of recovery book   Does not apply Once  . acetaminophen  650 mg Oral BID  . artificial tears   Left Eye QHS  . [START ON 01/22/2020] aspirin  325 mg Oral Daily  . [START ON 01/22/2020] clopidogrel  75 mg Oral QPM  . [START ON 01/22/2020] colestipol  2 g Oral QPM  . enoxaparin (LOVENOX) injection  40 mg Subcutaneous Q24H  . [START ON 01/22/2020] simvastatin  40 mg Oral q1800   And  . [START ON 01/22/2020] ezetimibe  10 mg Oral q1800  . free water  100 mL Per Tube Q6H  . insulin aspart  0-9 Units Subcutaneous Q4H  . oxybutynin  5 mg Oral BID  . pantoprazole sodium  40 mg Per Tube Daily  . polyvinyl alcohol  1 drop Left Eye QID   Continuous Infusions: . cefTRIAXone (ROCEPHIN)  IV    . dextrose 5% lactated ringers    . feeding supplement (OSMOLITE 1.2 CAL) 60 mL/hr at 01/21/20 1421     LOS: 5 days    Time spent: over 30 min    Lacretia Nicks, MD [facility]   To contact the attending provider between 7A-7P or the covering provider during  after hours 7P-7A,  please log into the web site [email or web address] and access using universal [place] password for that web site. If you do not have the password, please call the hospital operator.  01/21/2020, 7:40 PM

## 2020-01-21 NOTE — Progress Notes (Signed)
Pt corktrak clogged after administering pm meds. RN attempted to flush several times but unsuccessful. MD notified and new order received. Feedings stopped and tubing changed. Reported off to oncoming RN. Arabella Merles Naz Denunzio RN.

## 2020-01-21 NOTE — Progress Notes (Signed)
STROKE TEAM PROGRESS NOTE   INTERVAL HISTORY Daughter at bedside.  Patient sitting in chair, much more awake alert than yesterday.  In good spirit, interactive, orientated to self, people, still not orientated to place and time.  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  However, left UE weakness seems not obvious today.  Overnight MRI showed no significant change from prior.  Had a core track placed, pending swallow evaluation today.  OBJECTIVE Vitals:   01/20/20 2351 01/21/20 0400 01/21/20 0731 01/21/20 1148  BP: (!) 99/54 125/66 129/68 122/68  Pulse: 83 65 66 81  Resp: 15 17 19 18   Temp: 97.9 F (36.6 C) 98.1 F (36.7 C) 98.5 F (36.9 C) 98.5 F (36.9 C)  TempSrc: Oral Oral Oral Oral  SpO2: 99% 100% 100% 97%  Weight:      Height:       CBC:  Recent Labs  Lab 01/20/20 0403 01/21/20 0154  WBC 4.5 4.1  NEUTROABS 3.5 2.6  HGB 11.6* 10.8*  HCT 36.8 33.2*  MCV 92.2 93.3  PLT 226 181   Basic Metabolic Panel:  Recent Labs  Lab 01/20/20 0403 01/21/20 0154  NA 138 138  K 3.8 3.5  CL 110 109  CO2 21* 23  GLUCOSE 217* 139*  BUN 11 15  CREATININE 1.01* 1.06*  CALCIUM 9.5 9.7  MG 1.5* 2.0  PHOS 2.3* 2.5   Lipid Panel:     Component Value Date/Time   CHOL 209 (H) 01/16/2020 0500   TRIG 68 01/16/2020 0500   HDL 64 01/16/2020 0500   CHOLHDL 3.3 01/16/2020 0500   VLDL 14 01/16/2020 0500   LDLCALC 131 (H) 01/16/2020 0500   HgbA1c:  Lab Results  Component Value Date   HGBA1C 5.6 01/16/2020   Urine Drug Screen: No results found for: LABOPIA, COCAINSCRNUR, LABBENZ, AMPHETMU, THCU, LABBARB  Alcohol Level No results found for: [facility]  IMAGING  MRI WO Contrast 01/20/20 IMPRESSION: No significant interval change in extent of an early subacute right PCA territory infarct predominantly affecting the right temporal and occipital lobes. Unchanged mild petechial hemorrhage within portions of the right occipital lobe infarction territory. Smaller early  subacute infarcts within the left thalamus and left greater than right posterior pontomedullary junction have also not significantly changed in extent.  CT Code Stroke CTA Head W/WO contrast CT Code Stroke CTA Neck W/WO contrast 01/16/2020   ADDENDUM: Dr. 03/17/2020 called for clarification of an arterial stenosis reference in a subsequent brain MRI report. The patient's right PCA territory infarct is associated with a severe P2 segment stenosis with flow gap. There is also a flow reducing left P1 segment stenosis which is ameliorated by a posterior communicating artery.  IMPRESSION:  1. No intracranial arterial occlusion or high-grade stenosis.  2. Approximately 50% stenosis of the proximal left internal carotid artery secondary to mixed density atherosclerosis.  Aortic Atherosclerosis (ICD10-I70.0).   MR BRAIN WO CONTRAST 01/16/2020 MPRESSION:  1. Acute right PCA territory infarct, predominantly involving the temporal and occipital cortex.  2. Small acute infarct of the left thalamus.  3. Short segment occlusion of the right P2 segment with distal reconstitution demonstrated on earlier CTA.  4. Severe chronic ischemic microangiopathy.   CT HEAD CODE STROKE WO CONTRAST 01/16/2020 MPRESSION:  1. No acute intracranial abnormality.  2. ASPECTS is 10.   DG Chest 2 View 01/16/2020 IMPRESSION:  No evidence of acute cardiopulmonary disease.   Transthoracic Echocardiogram  1. Left ventricular ejection fraction, by estimation, is  60 to 65%. The left ventricle has normal function. The left ventricle has no regional wall motion abnormalities. Left ventricular diastolic parameters are consistent with Grade II diastolic dysfunction (pseudonormalization). Elevated left ventricular end-diastolic pressure.  2. Right ventricular systolic function is normal. The right ventricular size is normal. There is moderately elevated pulmonary artery systolic pressure. The estimated right ventricular systolic pressure is 60.8  mmHg.  3. Left atrial size was severely dilated.  4. The mitral valve is normal in structure. Mild mitral valve regurgitation. Moderate mitral stenosis. The mean mitral valve gradient is 8.0 mmHg.  5. Tricuspid valve regurgitation is moderate.  6. The aortic valve is tricuspid. Aortic valve regurgitation is not visualized. Moderate aortic valve stenosis. Aortic valve mean gradient measures 31.0 mmHg. Aortic valve Vmax measures 3.56 m/s.  7. The inferior vena cava is normal in size with greater than 50% respiratory variability, suggesting right atrial pressure of 3 mmHg.   Bilateral Lower Extremity Venous Dopplers   RIGHT:  - Findings appear essentially unchanged compared to previous examination.  - There is no evidence of deep vein thrombosis in the lower extremity.   LEFT:  - Findings appear essentially unchanged compared to previous examination.  - There is no evidence of deep vein thrombosis in the lower extremity.   ECG - SR rate 83 BPM. (See cardiology reading for complete details)  PHYSICAL EXAM    Temp:  [97.9 F (36.6 C)-98.5 F (36.9 C)] 98.5 F (36.9 C) (09/10 1148) Pulse Rate:  [65-83] 81 (09/10 1148) Resp:  [15-19] 18 (09/10 1148) BP: (99-152)/(54-70) 122/68 (09/10 1148) SpO2:  [86 %-100 %] 97 % (09/10 1148)  General - Well nourished, well developed, in no apparent distress.  Ophthalmologic - fundi not visualized due to noncooperation.  Cardiovascular - Regular rhythm and rate.  Neuro - awake alert, eyes open, sitting in chair, interactive, orientated to self and people but not to time or place.  Mild dysarthria, paucity of speech, but able to speak short sentences spontaneously, able to name 3/3 and repeat with dysarthric voice.  Bilateral gaze nystagmus, bilateral directions, however, improved with less prominant. Disconjugate eyes, with left eye upward deviation. PERRL. Not consistently blinking to visual threat to the left.  Left complete facial palsy with  decreased left frontal wrinkles, left watery eye, decreased left eye blinking, significanct left facial droop, however left palpebral fissure similar to or even smaller than the right.  Tongue midline.  Bilateral upper extremity 4-/5, bilateral lower extremity 2/5 proximal and 3/5 distal PF/DF.  Sensation subjectively symmetrical, but coordination b/l FTN grossly intact but very slow on action.  Gait not tested.   ASSESSMENT/PLAN Ms. ALAYSIAH BROWDER is a 86 y.o. female with history of  "episodes" which previously been attributed to diabetes, TIAs, "sleep attacks"DM, HTN, CAD (s/p stent) and OSA, presents with confusion and shaking. She did not receive IV t-PA due to late presentation (>4.5 hours from time of onset).  Stroke: acute right PCA, right lower pontine, left thalamic infarcts - embolic pattern, source unclear.  Code Stroke CT Head -  No acute intracranial abnormality. ASPECTS is 10.     CTA H&N - severe R P2 segment stenosis with flow gap. There is also a flow reducing left P1 segment stenosis which is ameliorated by a posterior communicating artery.  MRI head - Acute right PCA territory infarct, predominantly involving the temporal and occipital cortex. Small acute infarct of the left thalamus. Also left lower pontine infarct.    Limited MRI  repeat showed no stroke expansion  LE Venous Dopplers - neg DVT  2D Echo - EF 60-56%. No source of embolus. LA severely dilated.   Recommend 30-day cardiac event monitoring as outpatient to rule out A. fib  Sars Corona Virus 2 - negative  LDL - 131  HgbA1c - 5.6  VTE prophylaxis - Lovenox  clopidogrel 75 mg daily prior to admission, now on ASA 325 and plavix DAPT for 3 months and then plavix alone given the R P2 high grade stenosis.  Therapy recommendations:  SNF, but family would like to take her home  Disposition:  Pending  Encephalopathy, resolved  More lethargic and drowsy 9/9   Disorientation and LUE bicep weaker  Repeat  MRI - 01/20/20 - No significant interval change  CXR neg, no fever or leukocytosis  UA - 11-20 WBCs per HPF - many bacteria - nitrite negative  Much improved today  Episodes of altered consciousness  Per daughter, she has been having the unresponsive episodes for some time, lasting hours and resolved. She called it "diabetic episodes", but did not check glucose during the episodes.   Follows with Dr. Malvin Johns and NP Morrie Sheldon at [facility]  Etiology unclear, DDx including vertebrobasilar insufficiency, Lewy body dementia, seizure, syncope, hypoglycemia episode  Continue to follow up with Dr. Malvin Johns and NP Morrie Sheldon  Hypertension  Home BP meds: irbesartan ; HCTZ  Current BP meds: avapro 75   Stable on the low end  Avapro discontinued . Long-term BP goal normotensive  Hyperlipidemia  Home Lipid lowering medication: none (Vytorin in the past)  LDL 131, goal < 70  Now on zocor 40 and zetia 10  Continue statin at discharge  Diabetes  Home diabetic meds: Jardiance  Current diabetic meds: SSI   HgbA1c 5.6, goal < 7.0  Hypoglycemic episode 9/7 - now on TF  CBG monitoring  PCP follow up  Dysphagia . Secondary to stroke . Failed swallow evaluation  . NPO  . Speech on board . On cortrak for TF now . Daughter does not want to rush for decision on PEG now, but would like to revisit it if pt swallow not getting better for the next several days.  Other Stroke Risk Factors  Advanced age  Coronary artery disease  Obstructive sleep apnea, on CPAP at home  Other Active Problems  Code status - DNR  Dementia on aricept CKD - stage  3b - creatinine - 1.50->1.30->1.30-> 0.71->1.01->1.06 GERD   Hospital day # 5  Neurology will sign off. Please call with questions. Pt will follow up with Ashely NP at [facility] in about 4 weeks. Thanks for the consult.  Marvel Plan, MD PhD Stroke Neurology 01/21/2020 5:45 PM    To contact Stroke Continuity provider, please  refer to [email or web address]. After hours, contact General Neurology

## 2020-01-22 LAB — COMPREHENSIVE METABOLIC PANEL
ALT: 22 U/L (ref 0–44)
AST: 25 U/L (ref 15–41)
Albumin: 2.5 g/dL — ABNORMAL LOW (ref 3.5–5.0)
Alkaline Phosphatase: 71 U/L (ref 38–126)
Anion gap: 10 (ref 5–15)
BUN: 15 mg/dL (ref 8–23)
CO2: 21 mmol/L — ABNORMAL LOW (ref 22–32)
Calcium: 9.5 mg/dL (ref 8.9–10.3)
Chloride: 111 mmol/L (ref 98–111)
Creatinine, Ser: 0.75 mg/dL (ref 0.44–1.00)
GFR calc Af Amer: >60 mL/min (ref >60)
GFR calc non Af Amer: >60 mL/min (ref >60)
Glucose, Bld: 120 mg/dL — ABNORMAL HIGH (ref 70–99)
Potassium: 3.9 mmol/L (ref 3.5–5.1)
Sodium: 142 mmol/L (ref 135–145)
Total Bilirubin: 0.5 mg/dL (ref 0.3–1.2)
Total Protein: 5 g/dL — ABNORMAL LOW (ref 6.5–8.1)

## 2020-01-22 LAB — GLUCOSE, CAPILLARY
Glucose-Capillary: 105 mg/dL — ABNORMAL HIGH (ref 70–99)
Glucose-Capillary: 117 mg/dL — ABNORMAL HIGH (ref 70–99)
Glucose-Capillary: 122 mg/dL — ABNORMAL HIGH (ref 70–99)
Glucose-Capillary: 134 mg/dL — ABNORMAL HIGH (ref 70–99)
Glucose-Capillary: 138 mg/dL — ABNORMAL HIGH (ref 70–99)
Glucose-Capillary: 147 mg/dL — ABNORMAL HIGH (ref 70–99)
Glucose-Capillary: 149 mg/dL — ABNORMAL HIGH (ref 70–99)

## 2020-01-22 LAB — PHOSPHORUS: Phosphorus: 2.4 mg/dL — ABNORMAL LOW (ref 2.5–4.6)

## 2020-01-22 LAB — CBC WITH DIFFERENTIAL/PLATELET
Abs Immature Granulocytes: 0.02 10*3/uL (ref 0.00–0.07)
Basophils Absolute: 0 10*3/uL (ref 0.0–0.1)
Basophils Relative: 1 %
Eosinophils Absolute: 0.3 10*3/uL (ref 0.0–0.5)
Eosinophils Relative: 9 %
HCT: 33.9 % — ABNORMAL LOW (ref 36.0–46.0)
Hemoglobin: 10.4 g/dL — ABNORMAL LOW (ref 12.0–15.0)
Immature Granulocytes: 1 %
Lymphocytes Relative: 21 %
Lymphs Abs: 0.8 10*3/uL (ref 0.7–4.0)
MCH: 29.1 pg (ref 26.0–34.0)
MCHC: 30.7 g/dL (ref 30.0–36.0)
MCV: 95 fL (ref 80.0–100.0)
Monocytes Absolute: 0.6 10*3/uL (ref 0.1–1.0)
Monocytes Relative: 17 %
Neutro Abs: 1.8 10*3/uL (ref 1.7–7.7)
Neutrophils Relative %: 51 %
Platelets: 192 10*3/uL (ref 150–400)
RBC: 3.57 MIL/uL — ABNORMAL LOW (ref 3.87–5.11)
RDW: 13.7 % (ref 11.5–15.5)
WBC: 3.5 10*3/uL — ABNORMAL LOW (ref 4.0–10.5)
nRBC: 0 % (ref 0.0–0.2)

## 2020-01-22 LAB — MAGNESIUM: Magnesium: 1.9 mg/dL (ref 1.7–2.4)

## 2020-01-22 MED ORDER — SENNOSIDES 8.8 MG/5ML PO SYRP
5.0000 mL | ORAL_SOLUTION | Freq: Every evening | ORAL | Status: DC | PRN
  Filled 2020-01-22: qty 5

## 2020-01-22 MED ORDER — EZETIMIBE 10 MG PO TABS
10.0000 mg | ORAL_TABLET | Freq: Every day | ORAL | Status: DC
  Administered 2020-01-22 – 2020-02-03 (×13): 10 mg
  Filled 2020-01-22 (×13): qty 1

## 2020-01-22 MED ORDER — OXYBUTYNIN CHLORIDE 5 MG PO TABS
5.0000 mg | ORAL_TABLET | Freq: Two times a day (BID) | ORAL | Status: DC
  Administered 2020-01-22 – 2020-02-04 (×27): 5 mg
  Filled 2020-01-22 (×27): qty 1

## 2020-01-22 MED ORDER — COLESTIPOL HCL 1 G PO TABS
2.0000 g | ORAL_TABLET | Freq: Every evening | ORAL | Status: DC
  Administered 2020-01-22 – 2020-02-03 (×13): 2 g
  Filled 2020-01-22 (×13): qty 2

## 2020-01-22 MED ORDER — ACETAMINOPHEN 160 MG/5ML PO SOLN
650.0000 mg | Freq: Two times a day (BID) | ORAL | Status: DC
  Administered 2020-01-22 – 2020-02-04 (×26): 650 mg
  Filled 2020-01-22 (×27): qty 20.3

## 2020-01-22 MED ORDER — CLOPIDOGREL BISULFATE 75 MG PO TABS
75.0000 mg | ORAL_TABLET | Freq: Every evening | ORAL | Status: DC
  Administered 2020-01-22 – 2020-01-26 (×5): 75 mg
  Filled 2020-01-22 (×5): qty 1

## 2020-01-22 MED ORDER — DOCUSATE SODIUM 50 MG/5ML PO LIQD: 50.0000 mg | Freq: Every evening | ORAL | Status: DC | PRN

## 2020-01-22 MED ORDER — SIMVASTATIN 20 MG PO TABS
40.0000 mg | ORAL_TABLET | Freq: Every day | ORAL | Status: DC
  Administered 2020-01-22 – 2020-02-03 (×13): 40 mg
  Filled 2020-01-22 (×13): qty 2

## 2020-01-22 MED ORDER — ASPIRIN 325 MG PO TABS
325.0000 mg | ORAL_TABLET | Freq: Every day | ORAL | Status: DC
  Administered 2020-01-22 – 2020-02-04 (×14): 325 mg
  Filled 2020-01-22 (×14): qty 1

## 2020-01-22 NOTE — Progress Notes (Signed)
Coretrack was successfully unclogged and is infusing at this time.

## 2020-01-22 NOTE — Plan of Care (Signed)
  Problem: Education: Goal: Knowledge of patient specific risk factors addressed and post discharge goals established will improve Outcome: Progressing   Problem: Education: Goal: Knowledge of disease or condition will improve Outcome: Progressing Goal: Knowledge of secondary prevention will improve Outcome: Progressing Goal: Knowledge of patient specific risk factors addressed and post discharge goals established will improve Outcome: Progressing   Problem: Coping: Goal: Will verbalize positive feelings about self Outcome: Progressing Goal: Will identify appropriate support needs Outcome: Progressing   Problem: Health Behavior/Discharge Planning: Goal: Ability to manage health-related needs will improve Outcome: Progressing   Problem: Self-Care: Goal: Ability to participate in self-care as condition permits will improve Outcome: Progressing Goal: Verbalization of feelings and concerns over difficulty with self-care will improve Outcome: Progressing   Problem: Nutrition: Goal: Risk of aspiration will decrease Outcome: Progressing Goal: Dietary intake will improve Outcome: Progressing   Problem: Intracerebral Hemorrhage Tissue Perfusion: Goal: Complications of Intracerebral Hemorrhage will be minimized Outcome: Progressing   Problem: Ischemic Stroke/TIA Tissue Perfusion: Goal: Complications of ischemic stroke/TIA will be minimized Outcome: Progressing   Problem: Spontaneous Subarachnoid Hemorrhage Tissue Perfusion: Goal: Complications of Spontaneous Subarachnoid Hemorrhage will be minimized Outcome: Progressing   Problem: Education: Goal: Knowledge of General Education information will improve Description: Including pain rating scale, medication(s)/side effects and non-pharmacologic comfort measures Outcome: Progressing   Problem: Health Behavior/Discharge Planning: Goal: Ability to manage health-related needs will improve Outcome: Progressing   Problem: Clinical  Measurements: Goal: Ability to maintain clinical measurements within normal limits will improve Outcome: Progressing Goal: Will remain free from infection Outcome: Progressing Goal: Diagnostic test results will improve Outcome: Progressing Goal: Respiratory complications will improve Outcome: Progressing   Problem: Activity: Goal: Risk for activity intolerance will decrease Outcome: Progressing   Problem: Nutrition: Goal: Adequate nutrition will be maintained Outcome: Progressing   Problem: Coping: Goal: Level of anxiety will decrease Outcome: Progressing   Problem: Elimination: Goal: Will not experience complications related to bowel motility Outcome: Progressing Goal: Will not experience complications related to urinary retention Outcome: Progressing   Problem: Safety: Goal: Ability to remain free from injury will improve Outcome: Progressing   Problem: Skin Integrity: Goal: Risk for impaired skin integrity will decrease Outcome: Progressing

## 2020-01-22 NOTE — Progress Notes (Signed)
PROGRESS NOTE    Sydney Schultz  ZJQ:[id number] DOB: 1932/02/09 DOA: 01/16/2020 PCP: Jaclyn Shaggy, MD    Brief Narrative:  86 y.o.femalewith medical history significant ofDM, HTN, OSA and OA. She has been followed by neurology at [facility] with last OV 02/17/19 for f/u of evaluation for change in consciousness episode. Evaluation was non-focal. MRI at that time revealed cerebral atrophy and 2 old lacuanr infarcts right cerebral hemisphere. Patient has had progressive decline and prior to the precipitating event required assistance with dressing, toileting and mobility. On 01/15/20 she had increased weakness and around 5 PM was noted to hve developed a left facial droop and slurred speech. EMS was called and patient was brought to [facility] for further evaluation.  Assessment & Plan:   Principal Problem:   CVA (cerebral vascular accident) (HCC) Active Problems:   DM (diabetes mellitus) (HCC)   HTN (hypertension)   GERD (gastroesophageal reflux disease)   OSA (obstructive sleep apnea)   Malnutrition of moderate degree  CVA (cerebral vascular accident) - possibly embolic MRI with acute R PCA territory infarct predominantly involving the temporal and occipital cortex.  Small acute infarct of L thalamus.  Short segment occlusion of the R P2 segment with distal reconstitution demonstrated on earlier CTA.  Severe chronic microangiopathy. - 9/9 morning less interactive, more lethargic, progressive LUE weakness - discussed with neurology, repeat MRI without significant interval change - 9/10 am, she's improved sitting up in chair, more alert, exam improved - per neurology, embolic pattern, source unclear - recommending 30 day cardiac event monitor to r/o atrial fibrillation (msg sent to card master to help arrange follow up) - LDL 131, Cholesterol 209 - zocor 40 + zetia 10 - A1c 5.6 - Continue DAPT x 3 months and then plavix alone  - therapy recommending SNF, but family wants her to go  home - per SLP, not ready for PO diet, recommending spoonfulls of water and meds crushed with puree  - will place cortrak and follow progress with speech through the weekend.  If she's not improving, will need to discuss PEG early next week (discussed with neurology).   - for incomplete eyelid closure on L, QID eyedrops and nightly ointment  Acute Metabolic Encephalopathy: ? Unresponsive spells per neurology note.  Following with Dr. Malvin Johns and NP Morrie Sheldon at [facility].  Continue outpatient follow up. Lethargy on 9/9 may have been 2/2 UTI Presently on ceftriaxone  Possible UTI: continue ceftriaxone, follow urine culture  Dysphagia: cortrak placed 9/8, was clogged 9/10, now unclogged this AM. SLP consulted   Nausea  Vomiting: resolved  DM (diabetes mellitus)  -a1c 5.6 - continue SSI as tolerated  HTN (hypertension) - gradually restart home meds (start with irbesartan low dose, continue to hold HCTZ) -BP stable at this time  Dementia - continue aricept  CKD3 - stable at this time  Cough: CXR earlier without focal infiltrate.  Will continue to monitor.  Follow with speech.  DVT prophylaxis: Lovenox subq Code Status: DNR Family Communication: Pt in room, family at bedside  Status is: Inpatient  Remains inpatient appropriate because:Ongoing diagnostic testing needed not appropriate for outpatient work up and Unsafe d/c plan   Dispo: The patient is from: Home              Anticipated d/c is to: Home with [facility]              Anticipated d/c date is: 3 days  Patient currently is not medically stable to d/c.       Consultants:   Neurology  Procedures:     Antimicrobials: Anti-infectives (From admission, onward)   Start     Dose/Rate Route Frequency Ordered Stop   01/20/20 1930  cefTRIAXone (ROCEPHIN) 1 g in sodium chloride 0.9 % 100 mL IVPB        1 g 200 mL/hr over 30 Minutes Intravenous Every 24 hours 01/20/20 1901 01/25/20 1929        Subjective: Without complaints  Objective: Vitals:   01/22/20 0100 01/22/20 0424 01/22/20 0724 01/22/20 1142  BP:  (!) 159/83 (!) 150/73 136/73  Pulse:  69 67 84  Resp:  16 16 18   Temp:  98.4 F (36.9 C) 98.4 F (36.9 C) 99.4 F (37.4 C)  TempSrc:  Oral Oral Oral  SpO2: 99% 100% 100% 100%  Weight:      Height:        Intake/Output Summary (Last 24 hours) at 01/22/2020 1449 Last data filed at 01/22/2020 0500 Gross per 24 hour  Intake 1400 ml  Output 500 ml  Net 900 ml   Filed Weights   01/16/20 0308  Weight: 53.8 kg    Examination:  General exam: Appears calm and comfortable  Respiratory system: Clear to auscultation. Respiratory effort normal. Cardiovascular system: S1 & S2 heard, Regular Gastrointestinal system: Abdomen is nondistended, soft and nontender. No organomegaly or masses felt. Normal bowel sounds heard. Central nervous system: Alert and oriented. No focal neurological deficits. Extremities: Symmetric 5 x 5 power. Skin: No rashes, lesions Psychiatry: Judgement and insight appear normal. Mood & affect appropriate.   Data Reviewed: I have personally reviewed following labs and imaging studies  CBC: Recent Labs  Lab 01/16/20 0220 01/16/20 0220 01/16/20 0225 01/19/20 0329 01/20/20 0403 01/21/20 0154 01/22/20 0256  WBC 9.6  --   --  3.9* 4.5 4.1 3.5*  NEUTROABS 7.8*  --   --   --  3.5 2.6 1.8  HGB 13.8   < > 15.6* 12.7 11.6* 10.8* 10.4*  HCT 45.4   < > 46.0 40.4 36.8 33.2* 33.9*  MCV 97.2  --   --  92.2 92.2 93.3 95.0  PLT 257  --   --  194 226 181 192   < > = values in this interval not displayed.   Basic Metabolic Panel: Recent Labs  Lab 01/16/20 0507 01/19/20 0329 01/20/20 0403 01/21/20 0154 01/22/20 0256  NA 143 140 138 138 142  K 3.7 3.1* 3.8 3.5 3.9  CL 107 112* 110 109 111  CO2 20* 21* 21* 23 21*  GLUCOSE 141* 117* 217* 139* 120*  BUN 16 9 11 15 15   CREATININE 1.30* 0.71 1.01* 1.06* 0.75  CALCIUM 10.8* 9.7 9.5 9.7 9.5   MG  --  1.7 1.5* 2.0 1.9  PHOS  --   --  2.3* 2.5 2.4*   GFR: Estimated Creatinine Clearance: 40.7 mL/min (by C-G formula based on SCr of 0.75 mg/dL). Liver Function Tests: Recent Labs  Lab 01/16/20 0220 01/20/20 0403 01/21/20 0154 01/22/20 0256  AST 33 33 36 25  ALT 19 22 27 22   ALKPHOS 101 81 83 71  BILITOT 0.7 1.3* 0.9 0.5  PROT 7.1 5.4* 4.9* 5.0*  ALBUMIN 4.3 2.9* 2.6* 2.5*   No results for input(s): LIPASE, AMYLASE in the last 168 hours. No results for input(s): AMMONIA in the last 168 hours. Coagulation Profile: Recent Labs  Lab 01/16/20 0220  INR  1.1   Cardiac Enzymes: No results for input(s): CKTOTAL, CKMB, CKMBINDEX, TROPONINI in the last 168 hours. BNP (last 3 results) No results for input(s): PROBNP in the last 8760 hours. HbA1C: No results for input(s): HGBA1C in the last 72 hours. CBG: Recent Labs  Lab 01/21/20 2014 01/22/20 0023 01/22/20 0436 01/22/20 0731 01/22/20 1145  GLUCAP 115* 117* 105* 122* 147*   Lipid Profile: No results for input(s): CHOL, HDL, LDLCALC, TRIG, CHOLHDL, LDLDIRECT in the last 72 hours. Thyroid Function Tests: No results for input(s): TSH, T4TOTAL, FREET4, T3FREE, THYROIDAB in the last 72 hours. Anemia Panel: No results for input(s): VITAMINB12, FOLATE, FERRITIN, TIBC, IRON, RETICCTPCT in the last 72 hours. Sepsis Labs: No results for input(s): PROCALCITON, LATICACIDVEN in the last 168 hours.  Recent Results (from the past 240 hour(s))  SARS Coronavirus 2 by RT PCR (hospital order, performed in [facility] hospital lab) Nasopharyngeal Nasopharyngeal Swab     Status: None   Collection Time: 01/16/20  5:25 AM   Specimen: Nasopharyngeal Swab  Result Value Ref Range Status   SARS Coronavirus 2 NEGATIVE NEGATIVE Final    Comment: (NOTE) SARS-CoV-2 target nucleic acids are NOT DETECTED.  The SARS-CoV-2 RNA is generally detectable in upper and lower respiratory specimens during the acute phase of infection. The  lowest concentration of SARS-CoV-2 viral copies this assay can detect is 250 copies / mL. A negative result does not preclude SARS-CoV-2 infection and should not be used as the sole basis for treatment or other patient management decisions.  A negative result may occur with improper specimen collection / handling, submission of specimen other than nasopharyngeal swab, presence of viral mutation(s) within the areas targeted by this assay, and inadequate number of viral copies (<250 copies / mL). A negative result must be combined with clinical observations, patient history, and epidemiological information.  Fact Sheet for Patients:   BoilerBrush.com.cy  Fact Sheet for Healthcare Providers: https://pope.com/  This test is not yet approved or  cleared by the [place] FDA and has been authorized for detection and/or diagnosis of SARS-CoV-2 by FDA under an Emergency Use Authorization (EUA).  This EUA will remain in effect (meaning this test can be used) for the duration of the COVID-19 declaration under Section 564(b)(1) of the Act, 21 U.S.C. section 360bbb-3(b)(1), unless the authorization is terminated or revoked sooner.  Performed at [facility], [address] [place], [place] [address]   Culture, Urine     Status: Abnormal (Preliminary result)   Collection Time: 01/20/20  6:00 PM   Specimen: Urine, Random  Result Value Ref Range Status   Specimen Description URINE, RANDOM  Final   Special Requests NONE  Final   Culture (A)  Final    >=100,000 COLONIES/mL GRAM NEGATIVE RODS IDENTIFICATION AND SUSCEPTIBILITIES TO FOLLOW Performed at [facility], [address], [place], [place] [address]    Report Status PENDING  Incomplete     Radiology Studies: MR BRAIN WO CONTRAST  Result Date: 01/20/2020 CLINICAL DATA:  Stroke, follow-up. EXAM: MRI HEAD WITHOUT CONTRAST TECHNIQUE: Multiplanar, multiecho pulse sequences of  the brain and surrounding structures were obtained without intravenous contrast. COMPARISON:  Brain MRI 01/16/2020 FINDINGS: A limited protocol brain MRI was performed at the ordering provider's request consisting of axial and coronal diffusion-weighted sequences as well as an axial SWI sequence. Not significantly changed in extent, there is predominantly cortical restricted diffusion consistent with early subacute infarction within the right PCA territory affecting the right temporal  lobe (including medial right temporal lobe/hippocampus) and right occipital lobe. As before, there is likely mild involvement of the inferior right parietal lobe. Redemonstrated subcentimeter early subacute infarct within the left thalamus. Early subacute infarcts within the left greater than right posterior pontomedullary junction have also not significantly changed in extent. Unchanged mild petechial hemorrhage within portions of the right occipital lobe infarction territory. No interval acute infarct is identified elsewhere. IMPRESSION: No significant interval change in extent of an early subacute right PCA territory infarct predominantly affecting the right temporal and occipital lobes. Unchanged mild petechial hemorrhage within portions of the right occipital lobe infarction territory. Smaller early subacute infarcts within the left thalamus and left greater than right posterior pontomedullary junction have also not significantly changed in extent. Electronically Signed   By: Jackey Loge DO   On: 01/20/2020 17:19    Scheduled Meds: .  stroke: mapping our early stages of recovery book   Does not apply Once  . acetaminophen (TYLENOL) oral liquid 160 mg/5 mL  650 mg Per Tube BID  . artificial tears   Left Eye QHS  . aspirin  325 mg Per Tube Daily  . clopidogrel  75 mg Per Tube QPM  . colestipol  2 g Per Tube QPM  . enoxaparin (LOVENOX) injection  40 mg Subcutaneous Q24H  . simvastatin  40 mg Per Tube q1800   And  .  ezetimibe  10 mg Per Tube q1800  . free water  100 mL Per Tube Q6H  . insulin aspart  0-9 Units Subcutaneous Q4H  . oxybutynin  5 mg Per Tube BID  . pantoprazole sodium  40 mg Per Tube Daily  . polyvinyl alcohol  1 drop Left Eye QID   Continuous Infusions: . cefTRIAXone (ROCEPHIN)  IV 1 g (01/22/20 0049)  . dextrose 5% lactated ringers 75 mL/hr at 01/21/20 2123  . feeding supplement (OSMOLITE 1.2 CAL) 60 mL/hr at 01/21/20 1421     LOS: 6 days   Rickey Barbara, MD [facility] Pager On Amion  If 7PM-7AM, please contact night-coverage 01/22/2020, 2:49 PM

## 2020-01-23 LAB — GLUCOSE, CAPILLARY
Glucose-Capillary: 120 mg/dL — ABNORMAL HIGH (ref 70–99)
Glucose-Capillary: 120 mg/dL — ABNORMAL HIGH (ref 70–99)
Glucose-Capillary: 132 mg/dL — ABNORMAL HIGH (ref 70–99)
Glucose-Capillary: 134 mg/dL — ABNORMAL HIGH (ref 70–99)
Glucose-Capillary: 140 mg/dL — ABNORMAL HIGH (ref 70–99)
Glucose-Capillary: 144 mg/dL — ABNORMAL HIGH (ref 70–99)

## 2020-01-23 LAB — CREATININE, SERUM
Creatinine, Ser: 0.88 mg/dL (ref 0.44–1.00)
GFR calc Af Amer: >60 mL/min (ref >60)
GFR calc non Af Amer: 60 mL/min — ABNORMAL LOW (ref >60)

## 2020-01-23 LAB — URINE CULTURE: Culture: >=100000 — AB

## 2020-01-23 NOTE — Progress Notes (Signed)
PROGRESS NOTE    Sydney Schultz  RXV:[id number] DOB: 1932/03/30 DOA: 01/16/2020 PCP: Jaclyn Shaggy, MD    Brief Narrative:  86 y.o.femalewith medical history significant ofDM, HTN, OSA and OA. She has been followed by neurology at [facility] with last OV 02/17/19 for f/u of evaluation for change in consciousness episode. Evaluation was non-focal. MRI at that time revealed cerebral atrophy and 2 old lacuanr infarcts right cerebral hemisphere. Patient has had progressive decline and prior to the precipitating event required assistance with dressing, toileting and mobility. On 01/15/20 she had increased weakness and around 5 PM was noted to hve developed a left facial droop and slurred speech. EMS was called and patient was brought to [facility] for further evaluation.  Assessment & Plan:   Principal Problem:   CVA (cerebral vascular accident) (HCC) Active Problems:   DM (diabetes mellitus) (HCC)   HTN (hypertension)   GERD (gastroesophageal reflux disease)   OSA (obstructive sleep apnea)   Malnutrition of moderate degree  CVA (cerebral vascular accident) - possibly embolic MRI with acute R PCA territory infarct predominantly involving the temporal and occipital cortex.  Small acute infarct of L thalamus.  Short segment occlusion of the R P2 segment with distal reconstitution demonstrated on earlier CTA.  Severe chronic microangiopathy. - 9/9 morning less interactive, more lethargic, progressive LUE weakness - discussed with neurology, repeat MRI without significant interval change - 9/10 am, she's improved sitting up in chair, more alert, exam improved - per neurology, embolic pattern, source unclear - recommending 30 day cardiac event monitor to r/o atrial fibrillation (msg sent to card master to help arrange follow up) - LDL 131, Cholesterol 209 - zocor 40 + zetia 10 - A1c 5.6 - Continue DAPT x 3 months and then plavix alone  - therapy recommending SNF, but family wants her to go  home - per SLP, not ready for PO diet, recommending spoonfulls of water and meds crushed with puree  - will place cortrak and follow progress with speech through the weekend.  If she's not improving, will need to discuss PEG early next week (discussed with neurology).   - for incomplete eyelid closure on L, QID eyedrops and nightly ointment  Acute Metabolic Encephalopathy: ? Unresponsive spells per neurology note.  Following with Dr. Malvin Johns and NP Morrie Sheldon at [facility].  Continue outpatient follow up. Lethargy on 9/9 may have been 2/2 UTI Continued on ceftriaxone  Ruled in UTI: continue ceftriaxone, pan-sensitive ecoli on UA  Dysphagia: cortrak placed 9/8 -Appreciate input by SLP. Recommendation for PEG for prolonged supplemental nutrition while undergoing ST -Have consulted IR -Discussed with pt and family at bedside, all are in agreement  Nausea  Vomiting: resolved  DM (diabetes mellitus)  -a1c 5.6 - continue SSI as tolerated  HTN (hypertension) - gradually restart home meds (start with irbesartan low dose, continue to hold HCTZ) -BP remains stable at this time  Dementia - continue aricept  CKD3 - stable at this time  Cough: CXR performed earlier without focal infiltrate.  Will continue to monitor.  Follow with speech.  DVT prophylaxis: Lovenox subq Code Status: DNR Family Communication: Pt in room, family at bedside  Status is: Inpatient  Remains inpatient appropriate because:Ongoing diagnostic testing needed not appropriate for outpatient work up and Unsafe d/c plan   Dispo: The patient is from: Home              Anticipated d/c is to: Home with [facility]  Anticipated d/c date is: 3 days              Patient currently is not medically stable to d/c.    Consultants:   Neurology  Procedures:     Antimicrobials: Anti-infectives (From admission, onward)   Start     Dose/Rate Route Frequency Ordered Stop   01/20/20 1930  cefTRIAXone  (ROCEPHIN) 1 g in sodium chloride 0.9 % 100 mL IVPB        1 g 200 mL/hr over 30 Minutes Intravenous Every 24 hours 01/20/20 1901 01/25/20 1959      Subjective: No complaints this AM  Objective: Vitals:   01/23/20 0600 01/23/20 0749 01/23/20 1122 01/23/20 1545  BP:  (!) 131/59 115/67 (!) 131/58  Pulse:  78 92 79  Resp: 16 16 16 18   Temp:  98.2 F (36.8 C) 98.7 F (37.1 C) 98.2 F (36.8 C)  TempSrc:  Oral Oral Oral  SpO2:  100% 100% 100%  Weight:      Height:        Intake/Output Summary (Last 24 hours) at 01/23/2020 1615 Last data filed at 01/23/2020 [id number] Gross per 24 hour  Intake 3306.67 ml  Output --  Net 3306.67 ml   Filed Weights   01/16/20 0308  Weight: 53.8 kg    Examination: General exam: Awake, laying in bed, in nad, coretrak in place Respiratory system: Normal respiratory effort, no wheezing Cardiovascular system: regular rate, s1, s2 Gastrointestinal system: Soft, nondistended, positive BS Central nervous system: CN2-12 grossly intact, strength intact Extremities: Perfused, no clubbing Skin: Normal skin turgor, no notable skin lesions seen Psychiatry: Mood normal // no visual hallucinations   Data Reviewed: I have personally reviewed following labs and imaging studies  CBC: Recent Labs  Lab 01/19/20 0329 01/20/20 0403 01/21/20 0154 01/22/20 0256  WBC 3.9* 4.5 4.1 3.5*  NEUTROABS  --  3.5 2.6 1.8  HGB 12.7 11.6* 10.8* 10.4*  HCT 40.4 36.8 33.2* 33.9*  MCV 92.2 92.2 93.3 95.0  PLT 194 226 181 192   Basic Metabolic Panel: Recent Labs  Lab 01/19/20 0329 01/20/20 0403 01/21/20 0154 01/22/20 0256 01/23/20 0118  NA 140 138 138 142  --   K 3.1* 3.8 3.5 3.9  --   CL 112* 110 109 111  --   CO2 21* 21* 23 21*  --   GLUCOSE 117* 217* 139* 120*  --   BUN 9 11 15 15   --   CREATININE 0.71 1.01* 1.06* 0.75 0.88  CALCIUM 9.7 9.5 9.7 9.5  --   MG 1.7 1.5* 2.0 1.9  --   PHOS  --  2.3* 2.5 2.4*  --    GFR: Estimated Creatinine Clearance: 37  mL/min (by C-G formula based on SCr of 0.88 mg/dL). Liver Function Tests: Recent Labs  Lab 01/20/20 0403 01/21/20 0154 01/22/20 0256  AST 33 36 25  ALT 22 27 22   ALKPHOS 81 83 71  BILITOT 1.3* 0.9 0.5  PROT 5.4* 4.9* 5.0*  ALBUMIN 2.9* 2.6* 2.5*   No results for input(s): LIPASE, AMYLASE in the last 168 hours. No results for input(s): AMMONIA in the last 168 hours. Coagulation Profile: No results for input(s): INR, PROTIME in the last 168 hours. Cardiac Enzymes: No results for input(s): CKTOTAL, CKMB, CKMBINDEX, TROPONINI in the last 168 hours. BNP (last 3 results) No results for input(s): PROBNP in the last 8760 hours. HbA1C: No results for input(s): HGBA1C in the last 72 hours. CBG: Recent Labs  Lab 01/22/20 2303 01/23/20 0400 01/23/20 0749 01/23/20 1122 01/23/20 1548  GLUCAP 138* 144* 120* 134* 120*   Lipid Profile: No results for input(s): CHOL, HDL, LDLCALC, TRIG, CHOLHDL, LDLDIRECT in the last 72 hours. Thyroid Function Tests: No results for input(s): TSH, T4TOTAL, FREET4, T3FREE, THYROIDAB in the last 72 hours. Anemia Panel: No results for input(s): VITAMINB12, FOLATE, FERRITIN, TIBC, IRON, RETICCTPCT in the last 72 hours. Sepsis Labs: No results for input(s): PROCALCITON, LATICACIDVEN in the last 168 hours.  Recent Results (from the past 240 hour(s))  SARS Coronavirus 2 by RT PCR (hospital order, performed in [facility] hospital lab) Nasopharyngeal Nasopharyngeal Swab     Status: None   Collection Time: 01/16/20  5:25 AM   Specimen: Nasopharyngeal Swab  Result Value Ref Range Status   SARS Coronavirus 2 NEGATIVE NEGATIVE Final    Comment: (NOTE) SARS-CoV-2 target nucleic acids are NOT DETECTED.  The SARS-CoV-2 RNA is generally detectable in upper and lower respiratory specimens during the acute phase of infection. The lowest concentration of SARS-CoV-2 viral copies this assay can detect is 250 copies / mL. A negative result does not preclude SARS-CoV-2  infection and should not be used as the sole basis for treatment or other patient management decisions.  A negative result may occur with improper specimen collection / handling, submission of specimen other than nasopharyngeal swab, presence of viral mutation(s) within the areas targeted by this assay, and inadequate number of viral copies (<250 copies / mL). A negative result must be combined with clinical observations, patient history, and epidemiological information.  Fact Sheet for Patients:   BoilerBrush.com.cy  Fact Sheet for Healthcare Providers: https://pope.com/  This test is not yet approved or  cleared by the [place] FDA and has been authorized for detection and/or diagnosis of SARS-CoV-2 by FDA under an Emergency Use Authorization (EUA).  This EUA will remain in effect (meaning this test can be used) for the duration of the COVID-19 declaration under Section 564(b)(1) of the Act, 21 U.S.C. section 360bbb-3(b)(1), unless the authorization is terminated or revoked sooner.  Performed at [facility], [address], [place], [place] [address]   Culture, Urine     Status: Abnormal   Collection Time: 01/20/20  6:00 PM   Specimen: Urine, Random  Result Value Ref Range Status   Specimen Description URINE, RANDOM  Final   Special Requests   Final    NONE Performed at [facility], [address] [place], [place] [address]    Culture >=100,000 COLONIES/mL ESCHERICHIA COLI (A)  Final   Report Status 01/23/2020 FINAL  Final   Organism ID, Bacteria ESCHERICHIA COLI (A)  Final      Susceptibility   Escherichia coli - MIC*    AMPICILLIN <=2 SENSITIVE Sensitive     CEFAZOLIN <=4 SENSITIVE Sensitive     CEFTRIAXONE <=0.25 SENSITIVE Sensitive     CIPROFLOXACIN <=0.25 SENSITIVE Sensitive     GENTAMICIN <=1 SENSITIVE Sensitive     IMIPENEM <=0.25 SENSITIVE Sensitive     NITROFURANTOIN <=16 SENSITIVE Sensitive      TRIMETH/SULFA <=20 SENSITIVE Sensitive     AMPICILLIN/SULBACTAM <=2 SENSITIVE Sensitive     PIP/TAZO <=4 SENSITIVE Sensitive     * >=100,000 COLONIES/mL ESCHERICHIA COLI     Radiology Studies: No results found.  Scheduled Meds: .  stroke: mapping our early stages of recovery book   Does not apply Once  . acetaminophen (TYLENOL) oral liquid 160 mg/5 mL  650 mg  Per Tube BID  . artificial tears   Left Eye QHS  . aspirin  325 mg Per Tube Daily  . clopidogrel  75 mg Per Tube QPM  . colestipol  2 g Per Tube QPM  . enoxaparin (LOVENOX) injection  40 mg Subcutaneous Q24H  . simvastatin  40 mg Per Tube q1800   And  . ezetimibe  10 mg Per Tube q1800  . free water  100 mL Per Tube Q6H  . insulin aspart  0-9 Units Subcutaneous Q4H  . oxybutynin  5 mg Per Tube BID  . pantoprazole sodium  40 mg Per Tube Daily  . polyvinyl alcohol  1 drop Left Eye QID   Continuous Infusions: . cefTRIAXone (ROCEPHIN)  IV Stopped (01/22/20 2117)  . feeding supplement (OSMOLITE 1.2 CAL) 60 mL/hr at 01/21/20 1421     LOS: 7 days   Rickey Barbara, MD [facility] Pager On Amion  If 7PM-7AM, please contact night-coverage 01/23/2020, 4:15 PM

## 2020-01-23 NOTE — Plan of Care (Signed)
  Problem: Education: Goal: Knowledge of patient specific risk factors addressed and post discharge goals established will improve Outcome: Progressing   Problem: Education: Goal: Knowledge of disease or condition will improve Outcome: Progressing Goal: Knowledge of secondary prevention will improve Outcome: Progressing Goal: Knowledge of patient specific risk factors addressed and post discharge goals established will improve Outcome: Progressing   Problem: Coping: Goal: Will verbalize positive feelings about self Outcome: Progressing Goal: Will identify appropriate support needs Outcome: Progressing   Problem: Health Behavior/Discharge Planning: Goal: Ability to manage health-related needs will improve Outcome: Progressing   Problem: Self-Care: Goal: Ability to participate in self-care as condition permits will improve Outcome: Progressing Goal: Verbalization of feelings and concerns over difficulty with self-care will improve Outcome: Progressing   Problem: Nutrition: Goal: Risk of aspiration will decrease Outcome: Progressing Goal: Dietary intake will improve Outcome: Progressing   Problem: Intracerebral Hemorrhage Tissue Perfusion: Goal: Complications of Intracerebral Hemorrhage will be minimized Outcome: Progressing   Problem: Ischemic Stroke/TIA Tissue Perfusion: Goal: Complications of ischemic stroke/TIA will be minimized Outcome: Progressing   Problem: Spontaneous Subarachnoid Hemorrhage Tissue Perfusion: Goal: Complications of Spontaneous Subarachnoid Hemorrhage will be minimized Outcome: Progressing   Problem: Education: Goal: Knowledge of General Education information will improve Description: Including pain rating scale, medication(s)/side effects and non-pharmacologic comfort measures Outcome: Progressing   Problem: Health Behavior/Discharge Planning: Goal: Ability to manage health-related needs will improve Outcome: Progressing   Problem: Clinical  Measurements: Goal: Ability to maintain clinical measurements within normal limits will improve Outcome: Progressing Goal: Will remain free from infection Outcome: Progressing Goal: Diagnostic test results will improve Outcome: Progressing Goal: Respiratory complications will improve Outcome: Progressing   Problem: Activity: Goal: Risk for activity intolerance will decrease Outcome: Progressing   Problem: Nutrition: Goal: Adequate nutrition will be maintained Outcome: Progressing   Problem: Coping: Goal: Level of anxiety will decrease Outcome: Progressing   Problem: Elimination: Goal: Will not experience complications related to bowel motility Outcome: Progressing Goal: Will not experience complications related to urinary retention Outcome: Progressing   Problem: Safety: Goal: Ability to remain free from injury will improve Outcome: Progressing   Problem: Skin Integrity: Goal: Risk for impaired skin integrity will decrease Outcome: Progressing   

## 2020-01-23 NOTE — Progress Notes (Signed)
  Speech Language Pathology Treatment: Dysphagia  Patient Details Name: Sydney Schultz MRN: [id number] DOB: 23-May-1931 Today's Date: 01/23/2020 Time: 0277-4128 SLP Time Calculation (min) (ACUTE ONLY): 35 min  Assessment / Plan / Recommendation Clinical Impression  Intervention focused on dysphagia treatment including compensatory techniques, po trials and education with pt and daughter Sydney Schultz. Recommendations are to proceed with a more longer term feeding alternative. Pt alert pleasant and following all commands target at propulsion of thin and puree boluses to posterior oral cavity. Therapist utilized multiple strategies of varying bolus consistency, texture, temperature, oral placement of puree/thin, tactile input with lingual pressure, head tilt/extension, throat clear prior to initiate swallow. She was able to lateralize tongue into left buccal cavity and and move on command somewhat but mostly held the boluses. She was able to initiate a swallow with applesauce once out of 5 trials. Thin liquids possibly triggered a slight reflexive swallow but difficult to assess. Attempted lateralization of toothette sponge unsuccessfully. Used tongue depressor for lingual resistance with fair-mild success. Educated daughter to have pt use throughout the day to stimulate lingual movement.  Pt's daughter Sydney Schultz has a good understanding of why longer term means of nutrition is recommended. Can continue sips thin water. Discussed ST intervention will continue in acute care and at SNF. Pt seen for speech-cognitive impairments as well.    HPI HPI: Pt is an 86 yo female presenting with AMS and L facial droop. MRI showed R lower pontine, R PCA, and L thalamic infarcts. PMH includes: DM, TIAs, GERD, HTN, OSA, wheezing, OA      SLP Plan  Continue with current plan of care       Recommendations  Diet recommendations:  (spoon water) Liquids provided via: Teaspoon Medication Administration: Via alternative  means                Oral Care Recommendations: Oral care QID Follow up Recommendations: Skilled Nursing facility SLP Visit Diagnosis: Dysphagia, oropharyngeal phase (R13.12) Plan: Continue with current plan of care       GO                Sydney Schultz 01/23/2020, 9:43 AM  Sydney Schultz Sydney Schultz.Ed Nurse, children's [phone] weekend pager Office [phone]

## 2020-01-24 LAB — COMPREHENSIVE METABOLIC PANEL
ALT: 19 U/L (ref 0–44)
AST: 24 U/L (ref 15–41)
Albumin: 2.4 g/dL — ABNORMAL LOW (ref 3.5–5.0)
Alkaline Phosphatase: 71 U/L (ref 38–126)
Anion gap: 8 (ref 5–15)
BUN: 15 mg/dL (ref 8–23)
CO2: 25 mmol/L (ref 22–32)
Calcium: 9.4 mg/dL (ref 8.9–10.3)
Chloride: 109 mmol/L (ref 98–111)
Creatinine, Ser: 0.78 mg/dL (ref 0.44–1.00)
GFR calc Af Amer: >60 mL/min (ref >60)
GFR calc non Af Amer: >60 mL/min (ref >60)
Glucose, Bld: 146 mg/dL — ABNORMAL HIGH (ref 70–99)
Potassium: 4.4 mmol/L (ref 3.5–5.1)
Sodium: 142 mmol/L (ref 135–145)
Total Bilirubin: 0.3 mg/dL (ref 0.3–1.2)
Total Protein: 4.8 g/dL — ABNORMAL LOW (ref 6.5–8.1)

## 2020-01-24 LAB — GLUCOSE, CAPILLARY
Glucose-Capillary: 118 mg/dL — ABNORMAL HIGH (ref 70–99)
Glucose-Capillary: 124 mg/dL — ABNORMAL HIGH (ref 70–99)
Glucose-Capillary: 124 mg/dL — ABNORMAL HIGH (ref 70–99)
Glucose-Capillary: 129 mg/dL — ABNORMAL HIGH (ref 70–99)
Glucose-Capillary: 161 mg/dL — ABNORMAL HIGH (ref 70–99)
Glucose-Capillary: 99 mg/dL (ref 70–99)

## 2020-01-24 LAB — CBC
HCT: 31.5 % — ABNORMAL LOW (ref 36.0–46.0)
Hemoglobin: 9.9 g/dL — ABNORMAL LOW (ref 12.0–15.0)
MCH: 30 pg (ref 26.0–34.0)
MCHC: 31.4 g/dL (ref 30.0–36.0)
MCV: 95.5 fL (ref 80.0–100.0)
Platelets: 240 10*3/uL (ref 150–400)
RBC: 3.3 MIL/uL — ABNORMAL LOW (ref 3.87–5.11)
RDW: 13.6 % (ref 11.5–15.5)
WBC: 2.8 10*3/uL — ABNORMAL LOW (ref 4.0–10.5)
nRBC: 0 % (ref 0.0–0.2)

## 2020-01-24 NOTE — Progress Notes (Signed)
PROGRESS NOTE    Sydney FolksDoris M Settlemire  ZOX:[id number] DOB: October 17, 1931 DOA: 01/16/2020 PCP: Jaclyn Shaggyate, Denny C, MD    Brief Narrative:  86 y.o.femalewith medical history significant ofDM, HTN, OSA and OA. She has [facility] with last OV 02/17/19 for f/u of evaluation for change in consciousness episode. Evaluation was non-focal. MRI at that time revealed cerebral atrophy and 2 old lacuanr infarcts right cerebral hemisphere. Patient has had progressive decline and prior to the precipitating event required assistance with dressing, toileting and mobility. On 01/15/20 she had increased weakness and around 5 PM was noted to hve developed a left facial droop and slurred speech. EMS was called and patient was brought to [facility] for further evaluation.  Assessment & Plan:   Principal Problem:   CVA (cerebral vascular accident) (HCC) Active Problems:   DM (diabetes mellitus) (HCC)   HTN (hypertension)   GERD (gastroesophageal reflux disease)   OSA (obstructive sleep apnea)   Malnutrition of moderate degree  CVA (cerebral vascular accident) - possibly embolic MRI with acute R PCA territory infarct predominantly involving the temporal and occipital cortex.  Small acute infarct of L thalamus.  Short segment occlusion of the R P2 segment with distal reconstitution demonstrated on earlier CTA.  Severe chronic microangiopathy. - 9/9 morning less interactive, more lethargic, progressive LUE weakness - discussed with neurology, repeat MRI without significant interval change - 9/10 am, she's improved sitting up in chair, more alert, exam improved - per neurology, embolic pattern, source unclear - recommending 30 day cardiac event monitor to r/o atrial fibrillation (msg sent to card master to help arrange follow up) - LDL 131, Cholesterol 209 - zocor 40 + zetia 10 - A1c 5.6 - Continue DAPT x 3 months and then plavix alone  - therapy recommending SNF, but family wants her to go  home - per SLP, not ready for PO diet, recommending spoonfulls of water and meds crushed with puree  - will place cortrak and follow progress with speech through the weekend.  If she's not improving, will need to discuss PEG early next week (discussed with neurology).   - for incomplete eyelid closure on L, QID eyedrops and nightly ointment  Acute Metabolic Encephalopathy:  -Unresponsive spells per neurology note.  Following with Dr. Malvin JohnsPotter and NP [facility]ley [facility].  Continue outpatient follow up. -Lethargy on 9/9 may have been 2/2 UTI -Continued on ceftriaxone for now  Ruled in UTI:  -continue ceftriaxone, pan-sensitive ecoli on UA  Dysphagia:  -cortrak placed 9/8 -Appreciate input by SLP. Recommendation for PEG for prolonged supplemental nutrition while undergoing ST -Consulted IR -Discussed with pt and family at bedside, all are in agreement  Nausea  Vomiting: resolved  DM (diabetes mellitus)  -a1c 5.6 - continue SSI as tolerated as needed  HTN (hypertension) - gradually restart home meds (start with irbesartan low dose, continue to hold HCTZ) -BP remains stable currently  Dementia - continue aricept  CKD3 - stable at this time  Cough: CXR performed earlier without focal infiltrate.  Will continue to monitor.  SLP following  DVT prophylaxis: Lovenox subq Code Status: DNR Family Communication: Pt in room, family at bedside  Status is: Inpatient  Remains inpatient appropriate because:Ongoing diagnostic testing needed not appropriate for outpatient work up and Unsafe d/c plan   Dispo: The patient is from: Home              [facility]  Anticipated d/c date is: 3 days              Patient currently is not medically stable to d/c.    Consultants:   Neurology  Procedures:     Antimicrobials: Anti-infectives (From admission, onward)   Start     Dose/Rate Route Frequency Ordered Stop   01/20/20 1930   cefTRIAXone (ROCEPHIN) 1 g in sodium chloride 0.9 % 100 mL IVPB        1 g 200 mL/hr over 30 Minutes Intravenous Every 24 hours 01/20/20 1901 01/25/20 1959      Subjective: Without complaints at this time  Objective: Vitals:   01/24/20 0407 01/24/20 0735 01/24/20 1143 01/24/20 1622  BP:  (!) 112/52 (!) 143/72 (!) 113/97  Pulse:  81 84 87  Resp:  18 18 18   Temp:  99 F (37.2 C) 98.3 F (36.8 C) 98.6 F (37 C)  TempSrc:  Oral Axillary Axillary  SpO2:    98%  Weight: 57.6 kg     Height:        Intake/Output Summary (Last 24 hours) at 01/24/2020 1633 Last data filed at 01/23/2020 2200 Gross per 24 hour  Intake 90 ml  Output --  Net 90 ml   Filed Weights   01/16/20 0308 01/24/20 0407  Weight: 53.8 kg 57.6 kg    Examination: General exam: Awake, laying in bed, in nad Respiratory system: Normal respiratory effort, no wheezing Cardiovascular system: regular rate, s1, s2 Gastrointestinal system: Soft, nondistended, positive BS Central nervous system: CN2-12 grossly intact, strength intact Extremities: Perfused, no clubbing Skin: Normal skin turgor, no notable skin lesions seen Psychiatry: Mood normal // no visual hallucinations   Data Reviewed: I have personally reviewed following labs and imaging studies  CBC: Recent Labs  Lab 01/19/20 0329 01/20/20 0403 01/21/20 0154 01/22/20 0256 01/24/20 0322  WBC 3.9* 4.5 4.1 3.5* 2.8*  NEUTROABS  --  3.5 2.6 1.8  --   HGB 12.7 11.6* 10.8* 10.4* 9.9*  HCT 40.4 36.8 33.2* 33.9* 31.5*  MCV 92.2 92.2 93.3 95.0 95.5  PLT 194 226 181 192 240   Basic Metabolic Panel: Recent Labs  Lab 01/19/20 0329 01/19/20 0329 01/20/20 0403 01/21/20 0154 01/22/20 0256 01/23/20 0118 01/24/20 0322  NA 140  --  138 138 142  --  142  K 3.1*  --  3.8 3.5 3.9  --  4.4  CL 112*  --  110 109 111  --  109  CO2 21*  --  21* 23 21*  --  25  GLUCOSE 117*  --  217* 139* 120*  --  146*  BUN 9  --  11 15 15   --  15  CREATININE 0.71   < > 1.01*  1.06* 0.75 0.88 0.78  CALCIUM 9.7  --  9.5 9.7 9.5  --  9.4  MG 1.7  --  1.5* 2.0 1.9  --   --   PHOS  --   --  2.3* 2.5 2.4*  --   --    < > = values in this interval not displayed.   GFR: Estimated Creatinine Clearance: 40.7 mL/min (by C-G formula based on SCr of 0.78 mg/dL). Liver Function Tests: Recent Labs  Lab 01/20/20 0403 01/21/20 0154 01/22/20 0256 01/24/20 0322  AST 33 36 25 24  ALT 22 27 22 19   ALKPHOS 81 83 71 71  BILITOT 1.3* 0.9 0.5 0.3  PROT 5.4* 4.9* 5.0* 4.8*  ALBUMIN 2.9* 2.6*  2.5* 2.4*   No results for input(s): LIPASE, AMYLASE in the last 168 hours. No results for input(s): AMMONIA in the last 168 hours. Coagulation Profile: No results for input(s): INR, PROTIME in the last 168 hours. Cardiac Enzymes: No results for input(s): CKTOTAL, CKMB, CKMBINDEX, TROPONINI in the last 168 hours. BNP (last 3 results) No results for input(s): PROBNP in the last 8760 hours. HbA1C: No results for input(s): HGBA1C in the last 72 hours. CBG: Recent Labs  Lab 01/23/20 1953 01/23/20 2354 01/24/20 0357 01/24/20 0733 01/24/20 1138  GLUCAP 132* 140* 161* 118* 124*   Lipid Profile: No results for input(s): CHOL, HDL, LDLCALC, TRIG, CHOLHDL, LDLDIRECT in the last 72 hours. Thyroid Function Tests: No results for input(s): TSH, T4TOTAL, FREET4, T3FREE, THYROIDAB in the last 72 hours. Anemia Panel: No results for input(s): VITAMINB12, FOLATE, FERRITIN, TIBC, IRON, RETICCTPCT in the last 72 hours. Sepsis Labs: No results for input(s): PROCALCITON, LATICACIDVEN in the last 168 hours.  Recent Results (from the past 240 hour(s))  SARS Coronavirus 2 by RT PCR (hospital order, performed in [facility] hospital lab) Nasopharyngeal Nasopharyngeal Swab     Status: None   Collection Time: 01/16/20  5:25 AM   Specimen: Nasopharyngeal Swab  Result Value Ref Range Status   SARS Coronavirus 2 NEGATIVE NEGATIVE Final    Comment: (NOTE) SARS-CoV-2 target nucleic acids are NOT  DETECTED.  The SARS-CoV-2 RNA is generally detectable in upper and lower respiratory specimens during the acute phase of infection. The lowest concentration of SARS-CoV-2 viral copies this assay can detect is 250 copies / mL. A negative result does not preclude SARS-CoV-2 infection and should not be used as the sole basis for treatment or other patient management decisions.  A negative result may occur with improper specimen collection / handling, submission of specimen other than nasopharyngeal swab, presence of viral mutation(s) within the areas targeted by this assay, and inadequate number of viral copies (<250 copies / mL). A negative result must be combined with clinical observations, patient history, and epidemiological information.  Fact Sheet for Patients:   BoilerBrush.com.cy  Fact Sheet for Healthcare Providers: https://pope.com/  This test is not yet approved or  cleared by the [place] FDA and has been authorized for detection and/or diagnosis of SARS-CoV-2 by FDA under an Emergency Use Authorization (EUA).  This EUA will remain in effect (meaning this test can be used) for the duration of the COVID-19 declaration under Section 564(b)(1) of the Act, 21 U.S.C. section 360bbb-3(b)(1), unless the authorization is terminated or revoked sooner.  Performed at [facility], [address], [place], [place] [address]   Culture, Urine     Status: Abnormal   Collection Time: 01/20/20  6:00 PM   Specimen: Urine, Random  Result Value Ref Range Status   Specimen Description URINE, RANDOM  Final   Special Requests   Final    NONE Performed at [facility], [address], [place], [place] [address]    Culture >=100,000 COLONIES/mL ESCHERICHIA COLI (A)  Final   Report Status 01/23/2020 FINAL  Final   Organism ID, Bacteria ESCHERICHIA COLI (A)  Final      Susceptibility   Escherichia coli - MIC*    AMPICILLIN <=2  SENSITIVE Sensitive     CEFAZOLIN <=4 SENSITIVE Sensitive     CEFTRIAXONE <=0.25 SENSITIVE Sensitive     CIPROFLOXACIN <=0.25 SENSITIVE Sensitive     GENTAMICIN <=1 SENSITIVE Sensitive     IMIPENEM <=0.25 SENSITIVE  Sensitive     NITROFURANTOIN <=16 SENSITIVE Sensitive     TRIMETH/SULFA <=20 SENSITIVE Sensitive     AMPICILLIN/SULBACTAM <=2 SENSITIVE Sensitive     PIP/TAZO <=4 SENSITIVE Sensitive     * >=100,000 COLONIES/mL ESCHERICHIA COLI     Radiology Studies: No results found.  Scheduled Meds: .  stroke: mapping our early stages of recovery book   Does not apply Once  . acetaminophen (TYLENOL) oral liquid 160 mg/5 mL  650 mg Per Tube BID  . artificial tears   Left Eye QHS  . aspirin  325 mg Per Tube Daily  . clopidogrel  75 mg Per Tube QPM  . colestipol  2 g Per Tube QPM  . enoxaparin (LOVENOX) injection  40 mg Subcutaneous Q24H  . simvastatin  40 mg Per Tube q1800   And  . ezetimibe  10 mg Per Tube q1800  . free water  100 mL Per Tube Q6H  . insulin aspart  0-9 Units Subcutaneous Q4H  . oxybutynin  5 mg Per Tube BID  . pantoprazole sodium  40 mg Per Tube Daily  . polyvinyl alcohol  1 drop Left Eye QID   Continuous Infusions: . cefTRIAXone (ROCEPHIN)  IV 1 g (01/23/20 2059)  . feeding supplement (OSMOLITE 1.2 CAL) 1,000 mL (01/24/20 1248)     LOS: 8 days   Rickey Barbara, MD [facility] Pager On Amion  If 7PM-7AM, please contact night-coverage 01/24/2020, 4:33 PM

## 2020-01-24 NOTE — Progress Notes (Signed)
  IR PA note  Went to discuss and consent for Gastric tube placement Daughter at bedside  "Thought G tube would be for later in week or next" Wants to not rush into procedure  I told her we can come back later in week for discussion and consent. She is agreeable  RN aware

## 2020-01-24 NOTE — Progress Notes (Addendum)
Physical Therapy Treatment Patient Details Name: Sydney Schultz MRN: [id number] DOB: 04/23/1932 Today's Date: 01/24/2020    History of Present Illness Pt is a 86 y/o female with PMH of DM, HTN, OA; presenting with AMS and progressive decline in ADLS, mobility then L facial droop/slurred speech 9/4. MRI reveals acute lower pontine, R PCA territory infarct, L thalamic infarcts- embolic pattern.     PT Comments    Patient progressing well towards PT goals. Awake and alert today. Requires Max A for bed mobility but able to initiate movement this session. Requires assist of 2 to stand and for gait training with use of RW for support. Fatigues with activity today but motivated to mobilize. Noted to have impaired dynamic sitting balance when washing face with left lateral lean; however able to self correct with cues. Daughter torn about disposition, pending SNF vs maximizing HH services. Will follow.   Follow Up Recommendations  Supervision/Assistance - 24 hour;Home health PT;SNF     Equipment Recommendations  None recommended by PT    Recommendations for Other Services       Precautions / Restrictions Precautions Precautions: Fall Precaution Comments: NG tube Restrictions Weight Bearing Restrictions: No    Mobility  Bed Mobility Overal bed mobility: Needs Assistance Bed Mobility: Supine to Sit     Supine to sit: Max assist;HOB elevated     General bed mobility comments: improved initiation and movement today, VCs for sequencing, use of bed pad.  Transfers Overall transfer level: Needs assistance Equipment used: Rolling walker (2 wheeled) Transfers: Sit to/from Stand Sit to Stand: Mod assist;+2 physical assistance         General transfer comment: Pt needs increased time to be able to stand and shift hands to proper place on walker. Stood from EOB x1, posterior lean initially, ransferred to chair post ambulation.  Ambulation/Gait Ambulation/Gait assistance: Mod  assist;+2 safety/equipment Gait Distance (Feet): 12 Feet Assistive device: Rolling walker (2 wheeled) Gait Pattern/deviations: Step-through pattern;Trunk flexed;Decreased step length - right;Decreased step length - left;Shuffle;Narrow base of support Gait velocity: decreased   General Gait Details: Slow, unsteady gait with assist for RW management, balance and forward momentum; fatigues quickly.   Stairs             Wheelchair Mobility    Modified Rankin (Stroke Patients Only) Modified Rankin (Stroke Patients Only) Pre-Morbid Rankin Score: Moderately severe disability Modified Rankin: Moderately severe disability     Balance Overall balance assessment: Needs assistance Sitting-balance support: Feet supported;No upper extremity supported Sitting balance-Leahy Scale: Poor Sitting balance - Comments: Able to maintain static sitting balance at a fair level; however with washing her face she tended to lean to the left and needed VCs to correct on multiple occasions Postural control: Left lateral lean Standing balance support: During functional activity;Bilateral upper extremity supported Standing balance-Leahy Scale: Poor Standing balance comment: required mod +2 assist to stand and UE support for walking                            Cognition Arousal/Alertness: Awake/alert Behavior During Therapy: WFL for tasks assessed/performed Overall Cognitive Status: Impaired/Different from baseline Area of Impairment: Safety/judgement                         Safety/Judgement: Decreased awareness of safety;Decreased awareness of deficits            Exercises General Exercises - Lower Extremity Ankle Circles/Pumps: AROM;Both;10 reps;Supine  Long Arc Quad: AROM;Both;10 reps;Supine    General Comments General comments (skin integrity, edema, etc.): Daughter present and asking about disposition.      Pertinent Vitals/Pain Pain Assessment: Faces Faces Pain  Scale: No hurt    Home Living                      Prior Function            PT Goals (current goals can now be found in the care plan section) Acute Rehab PT Goals Patient Stated Goal: daughter wants to take her home but torn because she knows she will get more therapy at SNF but may not be able to visit her there Progress towards PT goals: Progressing toward goals    Frequency    Min 3X/week      PT Plan Current plan remains appropriate    Co-evaluation PT/OT/SLP Co-Evaluation/Treatment: Yes Reason for Co-Treatment: To address functional/ADL transfers;For patient/therapist safety PT goals addressed during session: Mobility/safety with mobility;Balance;Proper use of DME;Strengthening/ROM OT goals addressed during session: Strengthening/ROM;ADL's and self-care      AM-PAC PT "6 Clicks" Mobility   Outcome Measure  Help needed turning from your back to your side while in a flat bed without using bedrails?: A Lot Help needed moving from lying on your back to sitting on the side of a flat bed without using bedrails?: Total Help needed moving to and from a bed to a chair (including a wheelchair)?: A Lot Help needed standing up from a chair using your arms (e.g., wheelchair or bedside chair)?: A Lot Help needed to walk in hospital room?: A Lot Help needed climbing 3-5 steps with a railing? : Total 6 Click Score: 10    End of Session Equipment Utilized During Treatment: Gait belt Activity Tolerance: Patient tolerated treatment well Patient left: in chair;with call bell/phone within reach;with chair alarm set;with family/visitor present Nurse Communication: Mobility status PT Visit Diagnosis: Unsteadiness on feet (R26.81);Difficulty in walking, not elsewhere classified (R26.2);Other symptoms and signs involving the nervous system (R29.898);Hemiplegia and hemiparesis;Other abnormalities of gait and mobility (R26.89);Muscle weakness (generalized) (M62.81) Hemiplegia -  Right/Left: Left Hemiplegia - dominant/non-dominant: Non-dominant Hemiplegia - caused by: Cerebral infarction     Time: 4854-6270 PT Time Calculation (min) (ACUTE ONLY): 30 min  Charges:  $Gait Training: 8-22 mins                     Sydney Schultz, PT, DPT Acute Rehabilitation Services Pager [phone] Office [phone]       Sydney Schultz 01/24/2020, 12:38 PM

## 2020-01-24 NOTE — Progress Notes (Signed)
Occupational Therapy Treatment Patient Details Name: Sydney Schultz MRN: [id number] DOB: 03/05/1932 Today's Date: 01/24/2020    History of present illness Pt is a 86 y/o female with PMH of DM, HTN, OA; presenting with AMS and progressive decline in ADLS, mobility then L facial droop/slurred speech 9/4. MRI reveals acute lower pontine, R PCA territory infarct, L thalamic infarcts- embolic pattern.    OT comments  This 86 yo female admitted with above presents to acute OT making progress today with bed mobility, sit<>stand, ambulating, sitting balance, grooming, and toileting. She will continue to benefit from acute OT with follow up at SNF or HHOT depending on family decision.  Follow Up Recommendations  Other (comment) (SNF or max out [facility] services)    Equipment Recommendations  Wheelchair (measurements OT);Wheelchair cushion (measurements OT);Other (comment);Hospital bed (hoyer lift)       Precautions / Restrictions Precautions Precautions: Fall Restrictions Weight Bearing Restrictions: No       Mobility Bed Mobility Overal bed mobility: Needs Assistance Bed Mobility: Supine to Sit     Supine to sit: Max assist     General bed mobility comments: improved initiation and movement today, VCs for sequencing, use of bed pad  Transfers Overall transfer level: Needs assistance Equipment used: Rolling walker (2 wheeled) Transfers: Sit to/from Stand Sit to Stand: Mod assist;+2 physical assistance         General transfer comment: Pt needs increased time to be able to stand and shift hands to proper place on walker.    Balance Overall balance assessment: Needs assistance Sitting-balance support: No upper extremity supported;Feet supported Sitting balance-Leahy Scale: Poor Sitting balance - Comments: Able to maintain static sitting balance at a fair level; however with washing her face she tended to lean to the left and needed VCs to correct   Standing balance support:  Bilateral upper extremity supported Standing balance-Leahy Scale: Poor Standing balance comment: required mod +2 assist to stand                           ADL either performed or assessed with clinical judgement   ADL Overall ADL's : Needs assistance/impaired     Grooming: Set up;Supervision/safety;Wash/dry face;Sitting Grooming Details (indicate cue type and reason): EOB, VCs to keep upright balance at EOB (kept leaning left)                 Toilet Transfer: Moderate assistance;+2 for physical assistance;RW Toilet Transfer Details (indicate cue type and reason): Bed>10 feet to door>sit in recliner behind her Toileting- Architect and Hygiene: Total assistance Toileting - Clothing Manipulation Details (indicate cue type and reason): Mod A +2 sit<>stand             Vision Baseline Vision/History: Cataracts Vision Assessment?: Vision impaired- to be further tested in functional context          Cognition Arousal/Alertness: Awake/alert Behavior During Therapy: WFL for tasks assessed/performed Overall Cognitive Status: Impaired/Different from baseline Area of Impairment: Safety/judgement                         Safety/Judgement: Decreased awareness of safety;Decreased awareness of deficits                         Pertinent Vitals/ Pain       Pain Assessment: No/denies pain     Prior Functioning/Environment  Frequency  Min 2X/week        Progress Toward Goals  OT Goals(current goals can now be found in the care plan section)  Progress towards OT goals: Progressing toward goals  Acute Rehab OT Goals Patient Stated Goal: daughter wants to take her home but torn because she knows she will get more therapy at SNF but may not be able to visit her there Time For Goal Achievement: 01/31/20 Potential to Achieve Goals: Good  Plan Discharge plan remains appropriate    Co-evaluation    PT/OT/SLP  Co-Evaluation/Treatment: Yes Reason for Co-Treatment: For patient/therapist safety;To address functional/ADL transfers PT goals addressed during session: Mobility/safety with mobility;Balance;Proper use of DME;Strengthening/ROM OT goals addressed during session: Strengthening/ROM;ADL's and self-care      AM-PAC OT "6 Clicks" Daily Activity     Outcome Measure   Help from another person eating meals?: Total (cotrak) Help from another person taking care of personal grooming?: A Lot Help from another person toileting, which includes using toliet, bedpan, or urinal?: A Lot Help from another person bathing (including washing, rinsing, drying)?: A Lot Help from another person to put on and taking off regular upper body clothing?: A Lot Help from another person to put on and taking off regular lower body clothing?: Total 6 Click Score: 10    End of Session Equipment Utilized During Treatment: Gait belt;Rolling walker  OT Visit Diagnosis: Other abnormalities of gait and mobility (R26.89);Muscle weakness (generalized) (M62.81);Other symptoms and signs involving the nervous system (R29.898)   Activity Tolerance Patient tolerated treatment well   Patient Left in chair;with call bell/phone within reach;with chair alarm set;with family/visitor present;with restraints reapplied (mitts)   Nurse Communication Mobility status (written on board)        Time: 1950-9326 OT Time Calculation (min): 32 min  Charges: OT General Charges $OT Visit: 1 Visit OT Treatments $Self Care/Home Management : 8-22 mins  Ignacia Palma, OTR/L Acute [facility] Pager [phone] Office [phone]     Evette Georges 01/24/2020, 9:34 AM

## 2020-01-25 LAB — GLUCOSE, CAPILLARY
Glucose-Capillary: 126 mg/dL — ABNORMAL HIGH (ref 70–99)
Glucose-Capillary: 105 mg/dL — ABNORMAL HIGH (ref 70–99)
Glucose-Capillary: 157 mg/dL — ABNORMAL HIGH (ref 70–99)
Glucose-Capillary: 156 mg/dL — ABNORMAL HIGH (ref 70–99)
Glucose-Capillary: 91 mg/dL (ref 70–99)

## 2020-01-25 LAB — CBC
HCT: 32.2 % — ABNORMAL LOW (ref 36.0–46.0)
Hemoglobin: 10.1 g/dL — ABNORMAL LOW (ref 12.0–15.0)
MCH: 30.1 pg (ref 26.0–34.0)
MCHC: 31.4 g/dL (ref 30.0–36.0)
MCV: 96.1 fL (ref 80.0–100.0)
Platelets: 245 10*3/uL (ref 150–400)
RBC: 3.35 MIL/uL — ABNORMAL LOW (ref 3.87–5.11)
RDW: 13.8 % (ref 11.5–15.5)
WBC: 2.7 10*3/uL — ABNORMAL LOW (ref 4.0–10.5)
nRBC: 0 % (ref 0.0–0.2)

## 2020-01-25 LAB — COMPREHENSIVE METABOLIC PANEL
ALT: 20 U/L (ref 0–44)
AST: 28 U/L (ref 15–41)
Albumin: 2.4 g/dL — ABNORMAL LOW (ref 3.5–5.0)
Alkaline Phosphatase: 70 U/L (ref 38–126)
Anion gap: 7 (ref 5–15)
BUN: 17 mg/dL (ref 8–23)
CO2: 26 mmol/L (ref 22–32)
Calcium: 9.6 mg/dL (ref 8.9–10.3)
Chloride: 108 mmol/L (ref 98–111)
Creatinine, Ser: 0.76 mg/dL (ref 0.44–1.00)
GFR calc Af Amer: >60 mL/min (ref >60)
GFR calc non Af Amer: >60 mL/min (ref >60)
Glucose, Bld: 131 mg/dL — ABNORMAL HIGH (ref 70–99)
Potassium: 4.2 mmol/L (ref 3.5–5.1)
Sodium: 141 mmol/L (ref 135–145)
Total Bilirubin: 0.4 mg/dL (ref 0.3–1.2)
Total Protein: 4.9 g/dL — ABNORMAL LOW (ref 6.5–8.1)

## 2020-01-25 NOTE — TOC Progression Note (Signed)
Transition of Care [facility]) - Progression Note    Patient Details  Name: SHAUNEE MULKERN MRN: [id number] Date of Birth: 25-Dec-1931  Transition of Care [facility]) CM/SW Contact  Baldemar Lenis, [place] Phone Number: 01/25/2020, 4:14 PM  Clinical Narrative:   CSW spoke with patient's daughter, [name], via phone about recommendation for SNF. [name] indicated that she wants the patient at SNF for rehab, and will talk with her sister about it. CSW answered Cynthia's questions and discussed insurance coverage, transportation, and length of stay. [name] to discuss with the rest of the family and get back to CSW on final choice tomorrow. [name] would prefer [facility], and CSW asked [facility] to review referral. CSW to follow.    Expected Discharge Plan: Skilled Nursing Facility Barriers to Discharge: English as a second language teacher, Continued Medical Work up  Expected Discharge Plan and Services Expected Discharge Plan: Skilled Nursing Facility       Living arrangements for the past 2 months: Single Family Home                                       Social Determinants of Health (SDOH) Interventions    Readmission Risk Interventions No flowsheet data found.

## 2020-01-25 NOTE — Progress Notes (Signed)
°  Speech Language Pathology Treatment: Dysphagia  Patient Details Name: Sydney Schultz MRN: [id number] DOB: 10/19/1931 Today's Date: 01/25/2020 Time: 4696-2952 SLP Time Calculation (min) (ACUTE ONLY): 30 min  Assessment / Plan / Recommendation Clinical Impression  Patient seen to address dysphagia goals with daughter [name]) present for education. Daughter expressed her own fears about giving patient water as she does not want her to aspirate and so she wishes to leave this to therapy and nursing at this time. SLP educated daughter regarding aspiration risks and that patient is expected to have some aspiration events but with oral care and keeping amounts to minimal, aspiration risk is reduced. Patient accepted boluses of single ice chips, puree solids (applesauce) and spoon sips of water. She exhibited delayed oral transit and swallow initiation, but not overt s/s of aspiration or penetration. Patient was observed to move tongue in attempts to manage puree and ice chip boluses and oral cavity was clear post swallows. Continue with current plan of care with trials of upgraded solids with SLP.   HPI HPI: Pt is an 86 yo female presenting with AMS and L facial droop. MRI showed R lower pontine, R PCA, and L thalamic infarcts. PMH includes: DM, TIAs, GERD, HTN, OSA, wheezing, OA      SLP Plan  Continue with current plan of care       Recommendations  Diet recommendations: Other(comment) (spoon sips water after oral care) Liquids provided via: Teaspoon Medication Administration: Via alternative means Supervision: Staff to assist with self feeding;Full supervision/cueing for compensatory strategies                Oral Care Recommendations: Oral care QID Follow up Recommendations: Skilled Nursing facility SLP Visit Diagnosis: Dysphagia, oropharyngeal phase (R13.12) Plan: Continue with current plan of care       GO               Angela Nevin, MA, CCC-SLP Speech Therapy [facility]  Acute Rehab

## 2020-01-25 NOTE — Progress Notes (Signed)
PROGRESS NOTE    Sydney FolksDoris M Schultz  EAV:[id number] DOB: [birth year] DOA: 01/16/2020 PCP: Jaclyn Shaggyate, Denny C, MD    Brief Narrative:  86 y.o.femalewith medical history significant ofDM, HTN, OSA and OA. She has been followed by neurology at [facility] with last OV 02/17/19 for f/u of evaluation for change in consciousness episode. Evaluation was non-focal. MRI at that time revealed cerebral atrophy and 2 old lacuanr infarcts right cerebral hemisphere. Patient has had progressive decline and prior to the precipitating event required assistance with dressing, toileting and mobility. On 01/15/20 she had increased weakness and around 5 PM was noted to hve developed a left facial droop and slurred speech. EMS was called and patient was brought to [facility] for further evaluation.  Assessment & Plan:   Principal Problem:   CVA (cerebral vascular accident) (HCC) Active Problems:   DM (diabetes mellitus) (HCC)   HTN (hypertension)   GERD (gastroesophageal reflux disease)   OSA (obstructive sleep apnea)   Malnutrition of moderate degree  CVA (cerebral vascular accident) - possibly embolic MRI with acute R PCA territory infarct predominantly involving the temporal and occipital cortex.  Small acute infarct of L thalamus.  Short segment occlusion of the R P2 segment with distal reconstitution demonstrated on earlier CTA.  Severe chronic microangiopathy. - 9/9 morning less interactive, more lethargic, progressive LUE weakness - discussed with neurology, repeat MRI without significant interval change - 9/10 am, she's improved sitting up in chair, more alert, exam improved - per neurology, embolic pattern, source unclear - recommending 30 day cardiac event monitor to r/o atrial fibrillation (msg sent to card master to help arrange follow up) - LDL 131, Cholesterol 209 - zocor 40 + zetia 10 - A1c 5.6 - Continue DAPT x 3 months and then plavix alone  - therapy recommending SNF, but family wants her to go  home - per SLP, not ready for PO diet, recommending spoonfulls of water and meds crushed with puree  - will place cortrak and follow progress with speech through the weekend.  If she's not improving, will need to discuss PEG early next week (discussed with neurology).   - for incomplete eyelid closure on L, continue with QID eyedrops and nightly ointment  Acute Metabolic Encephalopathy:  -Unresponsive spells per neurology note.  Following with Dr. Malvin JohnsPotter and NP Morrie [facility]ley at [facility].  Continue outpatient follow up. -Lethargy on 9/9 may have been 2/2 UTI -Completed course of rocephin  Ruled in UTI:  -completed course of ceftriaxone, pan-sensitive ecoli on UA  Dysphagia:  -cortrak placed 9/8 -Appreciate input by SLP. Recommendation for PEG for prolonged supplemental nutrition while undergoing ST -family at bedside wishing to wait to allow improvement in swallow, continued with coretrak for now  Nausea  Vomiting: resolved  DM (diabetes mellitus)  -a1c 5.6 - continue SSI as tolerated as needed  HTN (hypertension) - gradually restart home meds (start with irbesartan low dose, continue to hold HCTZ) -BP remains stable at this time  Dementia - continue aricept  CKD3 - stable at this time  Cough: CXR performed earlier without focal infiltrate.  Will continue to monitor.  SLP following  DVT prophylaxis: Lovenox subq Code Status: DNR Family Communication: Pt in room, family at bedside  Status is: Inpatient  Remains inpatient appropriate because:Ongoing diagnostic testing needed not appropriate for outpatient work up and Unsafe d/c plan   Dispo: The patient is from: Home              Anticipated d/c is  to: Home with [facility]              Anticipated d/c date is: 3 days              Patient currently is not medically stable to d/c.    Consultants:   Neurology  Procedures:     Antimicrobials: Anti-infectives (From admission, onward)   Start     Dose/Rate  Route Frequency Ordered Stop   01/20/20 1930  cefTRIAXone (ROCEPHIN) 1 g in sodium chloride 0.9 % 100 mL IVPB        1 g 200 mL/hr over 30 Minutes Intravenous Every 24 hours 01/20/20 1901 01/25/20 1959      Subjective: No complaints at this time  Objective: Vitals:   01/24/20 2349 01/25/20 0348 01/25/20 0731 01/25/20 1132  BP: 119/68 (!) 115/48 (!) 129/52 (!) (P) 105/55  Pulse: 82 83 81 (P) 77  Resp: 18 18 18  (P) 18  Temp: (!) 97.4 F (36.3 C) 98.3 F (36.8 C) 98.3 F (36.8 C) (P) 97.9 F (36.6 C)  TempSrc: Axillary Axillary Oral (P) Oral  SpO2: 98% 99% 100% (P) 100%  Weight:      Height:       No intake or output data in the 24 hours ending 01/25/20 1424 Filed Weights   01/16/20 0308 01/24/20 0407  Weight: 53.8 kg 57.6 kg    Examination: General exam: Awake, laying in bed, in nad Respiratory system: Normal respiratory effort, no wheezing Cardiovascular system: regular rate, s1, s2 Gastrointestinal system: Soft, nondistended, positive BS, coretrak in place Central nervous system: CN2-12 grossly intact, strength intact Extremities: Perfused, no clubbing Skin: Normal skin turgor, no notable skin lesions seen Psychiatry: Mood normal // no visual hallucinations   Data Reviewed: I have personally reviewed following labs and imaging studies  CBC: Recent Labs  Lab 01/20/20 0403 01/21/20 0154 01/22/20 0256 01/24/20 0322 01/25/20 0358  WBC 4.5 4.1 3.5* 2.8* 2.7*  NEUTROABS 3.5 2.6 1.8  --   --   HGB 11.6* 10.8* 10.4* 9.9* 10.1*  HCT 36.8 33.2* 33.9* 31.5* 32.2*  MCV 92.2 93.3 95.0 95.5 96.1  PLT 226 181 192 240 245   Basic Metabolic Panel: Recent Labs  Lab 01/19/20 0329 01/19/20 0329 01/20/20 0403 01/20/20 0403 01/21/20 0154 01/22/20 0256 01/23/20 0118 01/24/20 0322 01/25/20 0358  NA 140   < > 138  --  138 142  --  142 141  K 3.1*   < > 3.8  --  3.5 3.9  --  4.4 4.2  CL 112*   < > 110  --  109 111  --  109 108  CO2 21*   < > 21*  --  23 21*  --  25 26   GLUCOSE 117*   < > 217*  --  139* 120*  --  146* 131*  BUN 9   < > 11  --  15 15  --  15 17  CREATININE 0.71   < > 1.01*   < > 1.06* 0.75 0.88 0.78 0.76  CALCIUM 9.7   < > 9.5  --  9.7 9.5  --  9.4 9.6  MG 1.7  --  1.5*  --  2.0 1.9  --   --   --   PHOS  --   --  2.3*  --  2.5 2.4*  --   --   --    < > = values in this interval not displayed.  GFR: Estimated Creatinine Clearance: 40.7 mL/min (by C-G formula based on SCr of 0.76 mg/dL). Liver Function Tests: Recent Labs  Lab 01/20/20 0403 01/21/20 0154 01/22/20 0256 01/24/20 0322 01/25/20 0358  AST 33 36 25 24 28   ALT 22 27 22 19 20   ALKPHOS 81 83 71 71 70  BILITOT 1.3* 0.9 0.5 0.3 0.4  PROT 5.4* 4.9* 5.0* 4.8* 4.9*  ALBUMIN 2.9* 2.6* 2.5* 2.4* 2.4*   No results for input(s): LIPASE, AMYLASE in the last 168 hours. No results for input(s): AMMONIA in the last 168 hours. Coagulation Profile: No results for input(s): INR, PROTIME in the last 168 hours. Cardiac Enzymes: No results for input(s): CKTOTAL, CKMB, CKMBINDEX, TROPONINI in the last 168 hours. BNP (last 3 results) No results for input(s): PROBNP in the last 8760 hours. HbA1C: No results for input(s): HGBA1C in the last 72 hours. CBG: Recent Labs  Lab 01/24/20 1950 01/24/20 2357 01/25/20 0344 01/25/20 0734 01/25/20 1137  GLUCAP 99 129* 126* 105* 157*   Lipid Profile: No results for input(s): CHOL, HDL, LDLCALC, TRIG, CHOLHDL, LDLDIRECT in the last 72 hours. Thyroid Function Tests: No results for input(s): TSH, T4TOTAL, FREET4, T3FREE, THYROIDAB in the last 72 hours. Anemia Panel: No results for input(s): VITAMINB12, FOLATE, FERRITIN, TIBC, IRON, RETICCTPCT in the last 72 hours. Sepsis Labs: No results for input(s): PROCALCITON, LATICACIDVEN in the last 168 hours.  Recent Results (from the past 240 hour(s))  SARS Coronavirus 2 by RT PCR (hospital order, performed in [facility] hospital lab) Nasopharyngeal Nasopharyngeal Swab     Status: None   Collection  Time: 01/16/20  5:25 AM   Specimen: Nasopharyngeal Swab  Result Value Ref Range Status   SARS Coronavirus 2 NEGATIVE NEGATIVE Final    Comment: (NOTE) SARS-CoV-2 target nucleic acids are NOT DETECTED.  The SARS-CoV-2 RNA is generally detectable in upper and lower respiratory specimens during the acute phase of infection. The lowest concentration of SARS-CoV-2 viral copies this assay can detect is 250 copies / mL. A negative result does not preclude SARS-CoV-2 infection and should not be used as the sole basis for treatment or other patient management decisions.  A negative result may occur with improper specimen collection / handling, submission of specimen other than nasopharyngeal swab, presence of viral mutation(s) within the areas targeted by this assay, and inadequate number of viral copies (<250 copies / mL). A negative result must be combined with clinical observations, patient history, and epidemiological information.  Fact Sheet for Patients:   [facility]  Fact Sheet for Healthcare Providers: 03/17/20  This test is not yet approved or  cleared by the [employer] FDA and has been authorized for detection and/or diagnosis of SARS-CoV-2 by FDA under an Emergency Use Authorization (EUA).  This EUA will remain in effect (meaning this test can be used) for the duration of the COVID-19 declaration under Section 564(b)(1) of the Act, 21 U.S.C. section 360bbb-3(b)(1), unless the authorization is terminated or revoked sooner.  Performed at [facility], [address], [place], [address] [place]   Culture, Urine     Status: Abnormal   Collection Time: 01/20/20  6:00 PM   Specimen: Urine, Random  Result Value Ref Range Status   Specimen Description URINE, RANDOM  Final   Special Requests   Final    NONE Performed at [facility], [address], [place], [address] [place]    Culture >=100,000  COLONIES/mL ESCHERICHIA COLI (A)  Final   Report Status  01/23/2020 FINAL  Final   Organism ID, Bacteria ESCHERICHIA COLI (A)  Final      Susceptibility   Escherichia coli - MIC*    AMPICILLIN <=2 SENSITIVE Sensitive     CEFAZOLIN <=4 SENSITIVE Sensitive     CEFTRIAXONE <=0.25 SENSITIVE Sensitive     CIPROFLOXACIN <=0.25 SENSITIVE Sensitive     GENTAMICIN <=1 SENSITIVE Sensitive     IMIPENEM <=0.25 SENSITIVE Sensitive     NITROFURANTOIN <=16 SENSITIVE Sensitive     TRIMETH/SULFA <=20 SENSITIVE Sensitive     AMPICILLIN/SULBACTAM <=2 SENSITIVE Sensitive     PIP/TAZO <=4 SENSITIVE Sensitive     * >=100,000 COLONIES/mL ESCHERICHIA COLI     Radiology Studies: No results found.  Scheduled Meds: .  stroke: mapping our early stages of recovery book   Does not apply Once  . acetaminophen (TYLENOL) oral liquid 160 mg/5 mL  650 mg Per Tube BID  . artificial tears   Left Eye QHS  . aspirin  325 mg Per Tube Daily  . clopidogrel  75 mg Per Tube QPM  . colestipol  2 g Per Tube QPM  . enoxaparin (LOVENOX) injection  40 mg Subcutaneous Q24H  . simvastatin  40 mg Per Tube q1800   And  . ezetimibe  10 mg Per Tube q1800  . free water  100 mL Per Tube Q6H  . insulin aspart  0-9 Units Subcutaneous Q4H  . oxybutynin  5 mg Per Tube BID  . pantoprazole sodium  40 mg Per Tube Daily  . polyvinyl alcohol  1 drop Left Eye QID   Continuous Infusions: . cefTRIAXone (ROCEPHIN)  IV 1 g (01/24/20 2041)  . feeding supplement (OSMOLITE 1.2 CAL) 1,000 mL (01/24/20 1248)     LOS: 9 days   Rickey Barbara, MD [facility] Pager On Amion  If 7PM-7AM, please contact night-coverage 01/25/2020, 2:24 PM

## 2020-01-25 NOTE — Progress Notes (Signed)
Nutrition Follow-up  DOCUMENTATION CODES:   Non-severe (moderate) malnutrition in context of chronic illness  INTERVENTION:  Continue via Cortrak: -Osmolite 1.2 cal @ 51ml/hr - free water flush Q6H  Tube feeding regimen provides 1728 kcal (100% of needs), 80 grams of protein, and 1181 ml of H2O. Total free water: 1581 ml/day  NUTRITION DIAGNOSIS:   Moderate Malnutrition related to chronic illness (TIA, CAD) as evidenced by energy intake < 75% for > or equal to 1 month, mild fat depletion, moderate fat depletion, mild muscle depletion, moderate muscle depletion.  Ongoing  GOAL:   Patient will meet greater than or equal to 90% of their needs  Progressing  MONITOR:   Diet advancement, Labs, Weight trends, TF tolerance, Skin, I & O's  REASON FOR ASSESSMENT:   Consult Enteral/tube feeding initiation and management  ASSESSMENT:   [name] is a 86 y.o. female with history of  "episodes" which previously been attributed to diabetes, TIAs, "sleep attacks"DM, HTN, CAD (s/p stent) and OSA, presents with confusion and shaking. She did not receive IV t-PA due to late presentation (>4.5 hours from time of onset).  Pt admitted with stroke (rt acute PCA, rt lower pontine, lt thalamic infarcts).   9/6- s/p BSE- recommend NPO; s/p MBSS- recommend NPO 9/7- s/p BSE- recommend NPO 9/8 - Cortrak placed (gastric)   Pt continues to receive TF via Cortrak. Currently receiving Osmolite 1.2 cal @ 82ml/hr with free water Q6H. This provides 1728 kcal, 80 grams of protein, and 1181 ml of free water. IR was consulted for PEG placement. Pt's family wishes to defer placement until later in the week.  Once PEG is placed and ready for use, pt may continue on current TF regimen.   Labs: CBGs 99-129 Medications: Novolog, Protonix  Diet Order:   Diet Order            Diet NPO time specified  Diet effective now                 EDUCATION NEEDS:   Education needs have been  addressed  Skin:  Skin Assessment: Reviewed RN Assessment  Last BM:  9/9  Height:   Ht Readings from Last 1 Encounters:  01/16/20 5\' 2"  (1.575 m)    Weight:   Wt Readings from Last 1 Encounters:  01/24/20 57.6 kg    Ideal Body Weight:  50 kg  BMI:  Body mass index is 23.23 kg/m.  Estimated Nutritional Needs:   Kcal:  1600-1800  Protein:  80-95 grams  Fluid:  > 1.6 L    01/26/20, MS, RD, LDN RD pager number and weekend/on-call pager number located in [place].

## 2020-01-26 DIAGNOSIS — D649 Anemia, unspecified: Secondary | ICD-10-CM

## 2020-01-26 LAB — CBC
HCT: 34.8 % — ABNORMAL LOW (ref 36.0–46.0)
Hemoglobin: 10.9 g/dL — ABNORMAL LOW (ref 12.0–15.0)
MCH: 30 pg (ref 26.0–34.0)
MCHC: 31.3 g/dL (ref 30.0–36.0)
MCV: 95.9 fL (ref 80.0–100.0)
Platelets: 277 10*3/uL (ref 150–400)
RBC: 3.63 MIL/uL — ABNORMAL LOW (ref 3.87–5.11)
RDW: 13.7 % (ref 11.5–15.5)
WBC: 3.4 10*3/uL — ABNORMAL LOW (ref 4.0–10.5)
nRBC: 0 % (ref 0.0–0.2)

## 2020-01-26 LAB — COMPREHENSIVE METABOLIC PANEL
ALT: 24 U/L (ref 0–44)
AST: 37 U/L (ref 15–41)
Albumin: 2.5 g/dL — ABNORMAL LOW (ref 3.5–5.0)
Alkaline Phosphatase: 78 U/L (ref 38–126)
Anion gap: 6 (ref 5–15)
BUN: 18 mg/dL (ref 8–23)
CO2: 26 mmol/L (ref 22–32)
Calcium: 9.7 mg/dL (ref 8.9–10.3)
Chloride: 108 mmol/L (ref 98–111)
Creatinine, Ser: 0.8 mg/dL (ref 0.44–1.00)
GFR calc Af Amer: >60 mL/min (ref >60)
GFR calc non Af Amer: >60 mL/min (ref >60)
Glucose, Bld: 121 mg/dL — ABNORMAL HIGH (ref 70–99)
Potassium: 4.4 mmol/L (ref 3.5–5.1)
Sodium: 140 mmol/L (ref 135–145)
Total Bilirubin: 0.4 mg/dL (ref 0.3–1.2)
Total Protein: 4.9 g/dL — ABNORMAL LOW (ref 6.5–8.1)

## 2020-01-26 LAB — GLUCOSE, CAPILLARY
Glucose-Capillary: 112 mg/dL — ABNORMAL HIGH (ref 70–99)
Glucose-Capillary: 129 mg/dL — ABNORMAL HIGH (ref 70–99)
Glucose-Capillary: 132 mg/dL — ABNORMAL HIGH (ref 70–99)
Glucose-Capillary: 138 mg/dL — ABNORMAL HIGH (ref 70–99)
Glucose-Capillary: 139 mg/dL — ABNORMAL HIGH (ref 70–99)
Glucose-Capillary: 140 mg/dL — ABNORMAL HIGH (ref 70–99)
Glucose-Capillary: 145 mg/dL — ABNORMAL HIGH (ref 70–99)

## 2020-01-26 NOTE — Progress Notes (Signed)
Physical Therapy Treatment Patient Details Name: Sydney Schultz MRN: [id number] DOB: 1931-11-21 Today's Date: 01/26/2020    History of Present Illness Pt is a 86 y/o female with PMH of DM, HTN, OA; presenting with AMS and progressive decline in ADLS, mobility then L facial droop/slurred speech 9/4. MRI reveals acute lower pontine, R PCA territory infarct, L thalamic infarcts- embolic pattern.     PT Comments    Pt resistant to participating initially.   Dtr was adamant that she participate some and pt was reluctant through out.  Emphasized transition to EOB, scooting, sit to stand and ambulation with use of the RW over 2 trials.    Follow Up Recommendations  SNF;Supervision/Assistance - 24 hour;Other (comment) (Working towar HHPT with dtr if possible)     Equipment Recommendations  None recommended by PT    Recommendations for Other Services       Precautions / Restrictions Precautions Precautions: Fall Precaution Comments: NG tube    Mobility  Bed Mobility Overal bed mobility: Needs Assistance Bed Mobility: Supine to Sit;Sit to Supine     Supine to sit: Max assist;+2 for physical assistance Sit to supine: Max assist;+2 for physical assistance   General bed mobility comments: pt was resistant at times.  She did need truncal assist for coming up and cotrol descending and Bil LE assist  Transfers Overall transfer level: Needs assistance Equipment used: Rolling walker (2 wheeled) Transfers: Sit to/from Stand Sit to Stand: Mod assist;+2 physical assistance         General transfer comment: cues for hand placement and both assist for coming forward and boost.  Ambulation/Gait Ambulation/Gait assistance: Mod assist;+2 safety/equipment Gait Distance (Feet): 10 Feet (then an additional 12 feet after sitting rest) Assistive device: Rolling walker (2 wheeled)   Gait velocity: decreased Gait velocity interpretation: <1.31 ft/sec, indicative of household  ambulator General Gait Details: slow unsteady steps, generally adducted in nature with a narrow BOS.  Pt needed control assist of the RW and stability assist    Stairs             Wheelchair Mobility    Modified Rankin (Stroke Patients Only) Modified Rankin (Stroke Patients Only) Modified Rankin: Moderately severe disability     Balance Overall balance assessment: Needs assistance   Sitting balance-Leahy Scale: Poor       Standing balance-Leahy Scale: Poor Standing balance comment: required both AD and external stability assist                            Cognition Arousal/Alertness: Awake/alert Behavior During Therapy: WFL for tasks assessed/performed Overall Cognitive Status: Impaired/Different from baseline                   Orientation Level: Disoriented to;Situation;Time Current Attention Level: Focused Memory: Decreased short-term memory Following Commands: Follows one step commands inconsistently;Follows one step commands with increased time Safety/Judgement: Decreased awareness of safety;Decreased awareness of deficits Awareness: Emergent Problem Solving: Requires verbal cues;Requires tactile cues        Exercises Other Exercises Other Exercises: warm up bil LE ROM AArom    General Comments General comments (skin integrity, edema, etc.): daughter present and pushing pt to participate even when she refused.      Pertinent Vitals/Pain Pain Assessment: Faces Faces Pain Scale: No hurt Pain Intervention(s): Monitored during session    Home Living  Prior Function            PT Goals (current goals can now be found in the care plan section) Acute Rehab PT Goals Patient Stated Goal: daughter wants to take her home but torn because she knows she will get more therapy at SNF but may not be able to visit her there PT Goal Formulation: With patient Time For Goal Achievement: 01/31/20 Potential to Achieve  Goals: Fair Progress towards PT goals: Progressing toward goals    Frequency    Min 3X/week      PT Plan Current plan remains appropriate    Co-evaluation              AM-PAC PT "6 Clicks" Mobility   Outcome Measure  Help needed turning from your back to your side while in a flat bed without using bedrails?: A Lot Help needed moving from lying on your back to sitting on the side of a flat bed without using bedrails?: Total Help needed moving to and from a bed to a chair (including a wheelchair)?: A Lot Help needed standing up from a chair using your arms (e.g., wheelchair or bedside chair)?: A Lot Help needed to walk in hospital room?: A Lot Help needed climbing 3-5 steps with a railing? : Total 6 Click Score: 10    End of Session   Activity Tolerance: Patient tolerated treatment well Patient left: in bed;with call bell/phone within reach;with bed alarm set;with family/visitor present Nurse Communication: Mobility status PT Visit Diagnosis: Unsteadiness on feet (R26.81);Difficulty in walking, not elsewhere classified (R26.2);Other symptoms and signs involving the nervous system (R29.898);Hemiplegia and hemiparesis;Other abnormalities of gait and mobility (R26.89);Muscle weakness (generalized) (M62.81) Hemiplegia - Right/Left: Left Hemiplegia - dominant/non-dominant: Non-dominant Hemiplegia - caused by: Cerebral infarction     Time: 1530-1556 PT Time Calculation (min) (ACUTE ONLY): 26 min  Charges:  $Gait Training: 8-22 mins $Therapeutic Activity: 8-22 mins                     01/26/2020  Sydney Schultz., PT Acute Rehabilitation Services [phone]  (pager) [phone]  (office)   Sydney Schultz 01/26/2020, 5:28 PM

## 2020-01-26 NOTE — Progress Notes (Signed)
PROGRESS NOTE    Sydney Schultz  HYQ:[id number] DOB: 02/26/1932 DOA: 01/16/2020 PCP: Jaclyn Shaggy, MD    Brief Narrative:  86 y.o.femalewith medical history significant ofDM, HTN, OSA and OA. She has been followed by neurology at [facility] with last OV 02/17/19 for f/u of evaluation for change in consciousness episode. Evaluation was non-focal. MRI at that time revealed cerebral atrophy and 2 old lacuanr infarcts right cerebral hemisphere. Patient has had progressive decline and prior to the precipitating event required assistance with dressing, toileting and mobility. On 01/15/20 she had increased weakness and around 5 PM was noted to hve developed a left facial droop and slurred speech. EMS was called and patient was brought to [facility] for further evaluation.  Assessment & Plan:   Principal Problem:   CVA (cerebral vascular accident) (HCC) Active Problems:   DM (diabetes mellitus) (HCC)   HTN (hypertension)   GERD (gastroesophageal reflux disease)   OSA (obstructive sleep apnea)   Malnutrition of moderate degree   Acute CVA (cerebral vascular accident) - possibly embolic MRI with acute R PCA territory infarct predominantly involving the temporal and occipital cortex.  Small acute infarct of L thalamus.  Short segment occlusion of the R P2 segment with distal reconstitution demonstrated on earlier CTA.  Severe chronic microangiopathy. - 9/9 morning less interactive, more lethargic, progressive LUE weakness - discussed with neurology, repeat MRI without significant interval change - 9/10 am, she's improved sitting up in chair, more alert, exam improved - per neurology, embolic pattern, source unclear - recommending 30 day cardiac event monitor to r/o atrial fibrillation (msg sent to card master to help arrange follow up) - LDL 131, Cholesterol 209 - zocor 40 + zetia 10 - A1c 5.6 - Continue DAPT x 3 months and then plavix alone  - per SLP, not ready for PO diet, recommending  spoonfulls of water and meds crushed with puree  -A core track was placed in patient receiving feeding through the tube.  No significant progress noted in terms of her dysphagia.  Discussed with her daughter who was at the bedside. We will need to proceed with PEG tube as it appears less likely that the patient will make any significant improvement in the short-term.  Will consult interventional radiology later today or tomorrow.   - for incomplete eyelid closure on L, continue with QID eyedrops and nightly ointment  Acute Metabolic Encephalopathy:  -Unresponsive spells per neurology note.  Following with Dr. Malvin Johns and NP Morrie Sheldon at [place] [facility]. -Lethargy on 9/9 may have been 2/2 UTI  UTI with E. coli -completed course of ceftriaxone, pan-sensitive ecoli on UA  Dysphagia:  -cortrak placed 9/8 -Appreciate input by SLP. Recommendation for PEG for prolonged supplemental nutrition while undergoing ST See discussion above.  Will reconsult interventional radiology as patient's family is now amenable to inserting a PEG tube.    Nausea  Vomiting: resolved  DM (diabetes mellitus)  -a1c 5.6 - continue SSI as tolerated as needed CBGs are reasonably well controlled.  Essential hypertension  Patient blood pressure noted stable.  At home she was noted to be on irbesartan and HCTZ.  Blood pressure is well controlled.  Will hold off on resuming any of her home medications.   Dementia - continue aricept  CKD3 - stable at this time  Cough: CXR performed earlier without focal infiltrate.  Will continue to monitor.  SLP following  Normocytic anemia Likely anemia of chronic disease.  No overt bleeding.  Hemoglobin is stable.  DVT prophylaxis: Lovenox subq Code Status: DNR Family Communication: Pt in room, family at bedside Disposition: Plan is for patient to go to skilled nursing facility when medically stable.  Would like for PEG tube to be inserted prior to discharge  Status  is: Inpatient  Remains inpatient appropriate because:Ongoing diagnostic testing needed not appropriate for outpatient work up and Unsafe d/c plan   Dispo: The patient is from: Home              Anticipated d/c is to: SNF              Anticipated d/c date is: 3 days              Patient currently is not medically stable to d/c.    Consultants:   Neurology  Interventional radiology  Procedures:     Antimicrobials: Anti-infectives (From admission, onward)   Start     Dose/Rate Route Frequency Ordered Stop   01/20/20 1930  cefTRIAXone (ROCEPHIN) 1 g in sodium chloride 0.9 % 100 mL IVPB        1 g 200 mL/hr over 30 Minutes Intravenous Every 24 hours 01/20/20 1901 01/25/20 1959      Subjective: Patient pleasantly confused.  Daughter at bedside.  Patient denies any pain or discomfort.  Objective: Vitals:   01/26/20 0028 01/26/20 0324 01/26/20 0500 01/26/20 0726  BP: (!) 129/58 (!) 114/59  (!) 106/43  Pulse: 83 82  80  Resp: 18 (!) 21  20  Temp: 98.1 F (36.7 C) 98.7 F (37.1 C)  98.6 F (37 C)  TempSrc: Oral Oral  Axillary  SpO2: 100% 98%  99%  Weight:   54.4 kg   Height:        Intake/Output Summary (Last 24 hours) at 01/26/2020 1121 Last data filed at 01/26/2020 0612 Gross per 24 hour  Intake --  Output 925 ml  Net -925 ml   Filed Weights   01/16/20 0308 01/24/20 0407 01/26/20 0500  Weight: 53.8 kg 57.6 kg 54.4 kg    Examination:  General appearance: Awake alert.  In no distress.  Pleasantly confused Resp: Clear to auscultation bilaterally.  Normal effort Cardio: S1-S2 is normal regular.  No S3-S4.  No rubs murmurs or bruit GI: Abdomen is soft.  Nontender nondistended.  Bowel sounds are present normal.  No masses organomegaly Extremities: No edema.  Full range of motion of lower extremities. Neurologic: Left-sided facial droop noted.  Left-sided weakness noted.  Pleasantly confused.  Disoriented.   Data Reviewed: I have personally reviewed following  labs and imaging studies  CBC: Recent Labs  Lab 01/20/20 0403 01/20/20 0403 01/21/20 0154 01/22/20 0256 01/24/20 0322 01/25/20 0358 01/26/20 0207  WBC 4.5   < > 4.1 3.5* 2.8* 2.7* 3.4*  NEUTROABS 3.5  --  2.6 1.8  --   --   --   HGB 11.6*   < > 10.8* 10.4* 9.9* 10.1* 10.9*  HCT 36.8   < > 33.2* 33.9* 31.5* 32.2* 34.8*  MCV 92.2   < > 93.3 95.0 95.5 96.1 95.9  PLT 226   < > 181 192 240 245 277   < > = values in this interval not displayed.   Basic Metabolic Panel: Recent Labs  Lab 01/20/20 0403 01/20/20 0403 01/21/20 0154 01/21/20 0154 01/22/20 0256 01/23/20 0118 01/24/20 0322 01/25/20 0358 01/26/20 0207  NA 138   < > 138  --  142  --  142 141 140  K 3.8   < >  3.5  --  3.9  --  4.4 4.2 4.4  CL 110   < > 109  --  111  --  109 108 108  CO2 21*   < > 23  --  21*  --  25 26 26   GLUCOSE 217*   < > 139*  --  120*  --  146* 131* 121*  BUN 11   < > 15  --  15  --  15 17 18   CREATININE 1.01*   < > 1.06*   < > 0.75 0.88 0.78 0.76 0.80  CALCIUM 9.5   < > 9.7  --  9.5  --  9.4 9.6 9.7  MG 1.5*  --  2.0  --  1.9  --   --   --   --   PHOS 2.3*  --  2.5  --  2.4*  --   --   --   --    < > = values in this interval not displayed.   GFR: Estimated Creatinine Clearance: 40.7 mL/min (by C-G formula based on SCr of 0.8 mg/dL). Liver Function Tests: Recent Labs  Lab 01/21/20 0154 01/22/20 0256 01/24/20 0322 01/25/20 0358 01/26/20 0207  AST 36 25 24 28  37  ALT 27 22 19 20 24   ALKPHOS 83 71 71 70 78  BILITOT 0.9 0.5 0.3 0.4 0.4  PROT 4.9* 5.0* 4.8* 4.9* 4.9*  ALBUMIN 2.6* 2.5* 2.4* 2.4* 2.5*   CBG: Recent Labs  Lab 01/25/20 1536 01/25/20 2014 01/26/20 0032 01/26/20 0330 01/26/20 0800  GLUCAP 156* 91 132* 138* 140*     Recent Results (from the past 240 hour(s))  Culture, Urine     Status: Abnormal   Collection Time: 01/20/20  6:00 PM   Specimen: Urine, Random  Result Value Ref Range Status   Specimen Description URINE, RANDOM  Final   Special Requests   Final     NONE Performed at [facility], [address] [place], 03/21/20 [facility]    Culture >=100,000 COLONIES/mL ESCHERICHIA COLI (A)  Final   Report Status 01/23/2020 FINAL  Final   Organism ID, Bacteria ESCHERICHIA COLI (A)  Final      Susceptibility   Escherichia coli - MIC*    AMPICILLIN <=2 SENSITIVE Sensitive     CEFAZOLIN <=4 SENSITIVE Sensitive     CEFTRIAXONE <=0.25 SENSITIVE Sensitive     CIPROFLOXACIN <=0.25 SENSITIVE Sensitive     GENTAMICIN <=1 SENSITIVE Sensitive     IMIPENEM <=0.25 SENSITIVE Sensitive     NITROFURANTOIN <=16 SENSITIVE Sensitive     TRIMETH/SULFA <=20 SENSITIVE Sensitive     AMPICILLIN/SULBACTAM <=2 SENSITIVE Sensitive     PIP/TAZO <=4 SENSITIVE Sensitive     * >=100,000 COLONIES/mL ESCHERICHIA COLI     Radiology Studies: No results found.  Scheduled Meds: .  stroke: mapping our early stages of recovery book   Does not apply Once  . acetaminophen (TYLENOL) oral liquid 160 mg/5 mL  650 mg Per Tube BID  . artificial tears   Left Eye QHS  . aspirin  325 mg Per Tube Daily  . clopidogrel  75 mg Per Tube QPM  . colestipol  2 g Per Tube QPM  . enoxaparin (LOVENOX) injection  40 mg Subcutaneous Q24H  . simvastatin  40 mg Per Tube q1800   And  . ezetimibe  10 mg Per Tube q1800  . free water  100 mL Per Tube Q6H  . insulin aspart  0-9 Units Subcutaneous Q4H  . oxybutynin  5 mg Per Tube BID  . pantoprazole sodium  40 mg Per Tube Daily  . polyvinyl alcohol  1 drop Left Eye QID   Continuous Infusions: . feeding supplement (OSMOLITE 1.2 CAL) 1,000 mL (01/26/20 0424)     LOS: 10 days   Osvaldo Shipper, MD [facility] Pager On Amion  If 7PM-7AM, please contact night-coverage 01/26/2020, 11:21 AM

## 2020-01-27 LAB — GLUCOSE, CAPILLARY
Glucose-Capillary: 114 mg/dL — ABNORMAL HIGH (ref 70–99)
Glucose-Capillary: 143 mg/dL — ABNORMAL HIGH (ref 70–99)
Glucose-Capillary: 127 mg/dL — ABNORMAL HIGH (ref 70–99)
Glucose-Capillary: 111 mg/dL — ABNORMAL HIGH (ref 70–99)

## 2020-01-27 MED ORDER — CLOPIDOGREL BISULFATE 75 MG PO TABS: 75.0000 mg | ORAL_TABLET | Freq: Every evening | ORAL | Status: DC

## 2020-01-27 MED ORDER — CEFAZOLIN SODIUM-DEXTROSE 2-4 GM/100ML-% IV SOLN
2.0000 g | Freq: Once | INTRAVENOUS | Status: AC
  Administered 2020-02-01: 2 g via INTRAVENOUS
  Filled 2020-01-27: qty 100

## 2020-01-27 NOTE — Progress Notes (Signed)
PROGRESS NOTE    Sydney Schultz  IPJ:[id number] DOB: 1932/04/23 DOA: 01/16/2020 PCP: Jaclyn Shaggy, MD    Brief Narrative:  86 y.o.femalewith medical history significant ofDM, HTN, OSA and OA. She has been followed by neurology at [facility] with last OV 02/17/19 for f/u of evaluation for change in consciousness episode. Evaluation was non-focal. MRI at that time revealed cerebral atrophy and 2 old lacuanr infarcts right cerebral hemisphere. Patient has had progressive decline and prior to the precipitating event required assistance with dressing, toileting and mobility. On 01/15/20 she had increased weakness and around 5 PM was noted to hve developed a left facial droop and slurred speech. EMS was called and patient was brought to [facility] for further evaluation.  Assessment & Plan:   Principal Problem:   CVA (cerebral vascular accident) (HCC) Active Problems:   DM (diabetes mellitus) (HCC)   HTN (hypertension)   GERD (gastroesophageal reflux disease)   OSA (obstructive sleep apnea)   Malnutrition of moderate degree   Acute CVA (cerebral vascular accident) - possibly embolic MRI with acute R PCA territory infarct predominantly involving the temporal and occipital cortex.  Small acute infarct of L thalamus.  Short segment occlusion of the R P2 segment with distal reconstitution demonstrated on earlier CTA.  Severe chronic microangiopathy. Per neurology the stroke is embolic pattern.  Source is unclear. Recommending 30 day cardiac event monitor to r/o atrial fibrillation.  Message was sent to cardiology to arrange.  Will need to remind them prior to discharge. LDL 131. Zocor 40 + zetia 10 - A1c 5.6 - Continue DAPT x 3 months and then plavix alone  Patient continues to have significant dysphagia.  Dysphagia:  Cortrak placed 9/8. SLP continues to follow. Recommendation for PEG for prolonged supplemental nutrition while undergoing ST. discussed this with patient's daughter.  She has  discussed with her sister as well.  They are agreeable to proceed with PEG tube placement.  We will reconsult interventional radiology.   Acute Metabolic Encephalopathy:  -Unresponsive spells per neurology note.  Following with Dr. Malvin Johns and NP Morrie Sheldon at [place] [facility]. -Lethargy on 9/9 may have been 2/2 UTI  UTI with E. 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  Patient was on Jardiance at home.  Currently just on SSI. CBGs are reasonably well controlled.   Essential hypertension  Noted to be stable.  At home she was noted to be on irbesartan and HCTZ.  Will hold off on resuming any of her home medications.   Dementia - continue aricept  CKD3a - stable at this time  Cough: CXR performed earlier without focal infiltrate.  Will continue to monitor.  SLP following  Normocytic anemia Likely anemia of chronic disease.  No overt bleeding.  Hemoglobin is stable.  DVT prophylaxis: Lovenox subq Code Status: DNR Family Communication: Pt in room, family at bedside Disposition: Plan is for patient to go to skilled nursing facility when medically stable.  Would like for PEG tube to be inserted prior to discharge  Status is: Inpatient  Remains inpatient appropriate because:Ongoing diagnostic testing needed not appropriate for outpatient work up and Unsafe d/c plan   Dispo: The patient is from: Home              Anticipated d/c is to: SNF  Anticipated d/c date is: 3 days              Patient currently is not medically stable to d/c.    Consultants:   Neurology  Interventional radiology  Procedures:     Antimicrobials: Anti-infectives (From admission, onward)   Start     Dose/Rate Route Frequency Ordered Stop   01/20/20 1930  cefTRIAXone (ROCEPHIN) 1 g in sodium chloride 0.9 % 100 mL IVPB        1  g 200 mL/hr over 30 Minutes Intravenous Every 24 hours 01/20/20 1901 01/25/20 1959      Subjective: Patient remains pleasantly confused.  Denies any complaints.  Does not appear to be in any discomfort.  Patient's daughter is at the bedside.  Objective: Vitals:   01/26/20 2325 01/27/20 0348 01/27/20 0751 01/27/20 0800  BP: (!) 126/55 108/81 128/60 128/60  Pulse: 82 88 87 87  Resp: 17 17 (!) 22 (!) 22  Temp: 98.1 F (36.7 C) 98.9 F (37.2 C) 98.3 F (36.8 C) 98.3 F (36.8 C)  TempSrc: Oral Oral Oral Oral  SpO2: 99% 98% 99% 99%  Weight:      Height:        Intake/Output Summary (Last 24 hours) at 01/27/2020 1050 Last data filed at 01/26/2020 2325 Gross per 24 hour  Intake --  Output 600 ml  Net -600 ml   Filed Weights   01/16/20 0308 01/24/20 0407 01/26/20 0500  Weight: 53.8 kg 57.6 kg 54.4 kg    Examination:  General appearance: Awake alert.  In no distress.  Distracted. Resp: Clear to auscultation bilaterally.  Normal effort Cardio: S1-S2 is normal regular.  No S3-S4.  No rubs murmurs or bruit GI: Abdomen is soft.  Nontender nondistended.  Bowel sounds are present normal.  No masses organomegaly    Data Reviewed: I have personally reviewed following labs and imaging studies  CBC: Recent Labs  Lab 01/21/20 0154 01/22/20 0256 01/24/20 0322 01/25/20 0358 01/26/20 0207  WBC 4.1 3.5* 2.8* 2.7* 3.4*  NEUTROABS 2.6 1.8  --   --   --   HGB 10.8* 10.4* 9.9* 10.1* 10.9*  HCT 33.2* 33.9* 31.5* 32.2* 34.8*  MCV 93.3 95.0 95.5 96.1 95.9  PLT 181 192 240 245 277   Basic Metabolic Panel: Recent Labs  Lab 01/21/20 0154 01/21/20 0154 01/22/20 0256 01/23/20 0118 01/24/20 0322 01/25/20 0358 01/26/20 0207  NA 138  --  142  --  142 141 140  K 3.5  --  3.9  --  4.4 4.2 4.4  CL 109  --  111  --  109 108 108  CO2 23  --  21*  --  25 26 26   GLUCOSE 139*  --  120*  --  146* 131* 121*  BUN 15  --  15  --  15 17 18   CREATININE 1.06*   < > 0.75 0.88 0.78 0.76 0.80   CALCIUM 9.7  --  9.5  --  9.4 9.6 9.7  MG 2.0  --  1.9  --   --   --   --   PHOS 2.5  --  2.4*  --   --   --   --    < > = values in this interval not displayed.   GFR: Estimated Creatinine Clearance: 40.7 mL/min (by C-G formula based on SCr of 0.8 mg/dL). Liver Function Tests: Recent Labs  Lab 01/21/20 0154 01/22/20 0256 01/24/20 03/23/20 01/25/20 0358 01/26/20 01/27/20  AST 36 25 24 28  37  ALT 27 22 19 20 24   ALKPHOS 83 71 71 70 78  BILITOT 0.9 0.5 0.3 0.4 0.4  PROT 4.9* 5.0* 4.8* 4.9* 4.9*  ALBUMIN 2.6* 2.5* 2.4* 2.4* 2.5*   CBG: Recent Labs  Lab 01/26/20 1555 01/26/20 2001 01/26/20 2324 01/27/20 0350 01/27/20 0748  GLUCAP 112* 145* 129* 114* 143*     Recent Results (from the past 240 hour(s))  Culture, Urine     Status: Abnormal   Collection Time: 01/20/20  6:00 PM   Specimen: Urine, Random  Result Value Ref Range Status   Specimen Description URINE, RANDOM  Final   Special Requests   Final    NONE Performed at [facility], [address] [place], [address] [place]    Culture >=100,000 COLONIES/mL ESCHERICHIA COLI (A)  Final   Report Status 01/23/2020 FINAL  Final   Organism ID, Bacteria ESCHERICHIA COLI (A)  Final      Susceptibility   Escherichia coli - MIC*    AMPICILLIN <=2 SENSITIVE Sensitive     CEFAZOLIN <=4 SENSITIVE Sensitive     CEFTRIAXONE <=0.25 SENSITIVE Sensitive     CIPROFLOXACIN <=0.25 SENSITIVE Sensitive     GENTAMICIN <=1 SENSITIVE Sensitive     IMIPENEM <=0.25 SENSITIVE Sensitive     NITROFURANTOIN <=16 SENSITIVE Sensitive     TRIMETH/SULFA <=20 SENSITIVE Sensitive     AMPICILLIN/SULBACTAM <=2 SENSITIVE Sensitive     PIP/TAZO <=4 SENSITIVE Sensitive     * >=100,000 COLONIES/mL ESCHERICHIA COLI     Radiology Studies: No results found.  Scheduled Meds: .  stroke: mapping our early stages of recovery book   Does not apply Once  . acetaminophen (TYLENOL) oral liquid 160 mg/5 mL  650 mg Per Tube BID  . artificial tears   Left Eye  QHS  . aspirin  325 mg Per Tube Daily  . clopidogrel  75 mg Per Tube QPM  . colestipol  2 g Per Tube QPM  . enoxaparin (LOVENOX) injection  40 mg Subcutaneous Q24H  . simvastatin  40 mg Per Tube q1800   And  . ezetimibe  10 mg Per Tube q1800  . free water  100 mL Per Tube Q6H  . insulin aspart  0-9 Units Subcutaneous Q4H  . oxybutynin  5 mg Per Tube BID  . pantoprazole sodium  40 mg Per Tube Daily  . polyvinyl alcohol  1 drop Left Eye QID   Continuous Infusions: . feeding supplement (OSMOLITE 1.2 CAL) 1,000 mL (01/26/20 0424)     LOS: 11 days   03/24/2020, MD [facility] Pager On Amion  If 7PM-7AM, please contact night-coverage 01/27/2020, 10:50 AM

## 2020-01-27 NOTE — Progress Notes (Signed)
°  Speech Language Pathology Treatment:    Patient Details Name: Sydney Schultz MR: [id number] DOB: 02-13-1932 Today's Date: 01/27/2020 Time: 1511-1530 SLP Time Calculation (min) (ACUTE ONLY): 19 min  Assessment / Plan / Recommendation Clinical Impression  Followed up for PO trials and diet readiness. Pt alert this date, SLP arriving immediately after bath. Pt exhibited improved oral control, acceptance of POs and tolerance of trials. Assessed with thin liquids via cup and straw, puree via teaspoon. No overt s/sx of aspiration exhibited. Palpable swallow noted, suspected to be delayed. Pt with improved oral clearance, mild delay noted in AP transit of bolus however no pocketing noted. Bolus was presented on right side of oral cavity as left sided oral motor deficits continued to be appreciated. Recommend dysphagia 1 (puree) and thin liquid diet initiation with meds in puree (crush as needed) with full supervision with all POs. SLP to follow up for diet tolerance.   HPI HPI: Pt is an 86 yo female presenting with AMS and L facial droop. MRI showed R lower pontine, R PCA, and L thalamic infarcts. PMH includes: DM, TIAs, GERD, HTN, OSA, wheezing, OA      SLP Plan  Continue with current plan of care       Recommendations  Diet recommendations: Dysphagia 1 (puree);Thin liquid Liquids provided via: Cup;Straw Medication Administration: Whole meds with puree Supervision: Staff to assist with self feeding;Full supervision/cueing for compensatory strategies Compensations: Slow rate;Small sips/bites;Minimize environmental distractions;Monitor for anterior loss Postural Changes and/or Swallow Maneuvers: Seated upright 90 degrees;Upright 30-60 min after meal                Oral Care Recommendations: Oral care BID Follow up Recommendations: Skilled Nursing facility SLP Visit Diagnosis: Dysphagia, oropharyngeal phase (R13.12) Plan: Continue with current plan of care       GO                 Sydney Schultz E [name] MA, CCC-SLP  Acute [facility] Services 01/27/2020, 3:40 PM

## 2020-01-27 NOTE — Consult Note (Signed)
Chief Complaint: Patient was seen in consultation today for  Chief Complaint  Patient presents with  . Code Stroke    Referring Physician(s): Dr. Rito Ehrlich  Supervising Physician: Gilmer Mor  Patient Status: [facility] - In-pt  History of Present Illness: Sydney Schultz is a 86 y.o. female with a medical history significant for DM, HTN and TIAs. She presented to the ED 01/16/20 with confusion, facial droop, dysarthria and weakness. A Code Stroke was called. MRI showed right PCA territory infarct and a small acute infarct of the left thalamus along with other changes. Patient has stabilized but continues to have significant dysphagia. SLP has recommended gastrostomy tube for prolonged supplemental nutrition.   Interventional Radiology has been asked to evaluate this patient for an image-guided percutaneous gastrostomy tube.  Past Medical History:  Diagnosis Date  . Arthritis   . Diabetes (HCC)   . Dyspnea    with exertion  . GERD (gastroesophageal reflux disease)   . Hypertension   . Incontinence   . Sleep apnea    uses cpap  . Swelling of both lower extremities   . Wheezing     Past Surgical History:  Procedure Laterality Date  . ANKLE SURGERY    . CARDIAC CATHETERIZATION     coronary stent  . CARPAL TUNNEL RELEASE Right   . CATARACT EXTRACTION W/PHACO Left 07/29/2017   Procedure: CATARACT EXTRACTION PHACO AND INTRAOCULAR LENS PLACEMENT (IOC);  Surgeon: Galen Manila, MD;  Location: [facility] ORS;  Service: Ophthalmology;  Laterality: Left;  [place] 00:38.9 AP% 12.0 CDE 4.68 Fluid Pack Lot # [id number]  . CATARACT EXTRACTION W/PHACO Right 08/19/2017   Procedure: CATARACT EXTRACTION PHACO AND INTRAOCULAR LENS PLACEMENT (IOC);  Surgeon: Galen Manila, MD;  Location: [facility] ORS;  Service: Ophthalmology;  Laterality: Right;  [place] 00:35 AP% 16.0 CDE 5.69 Fluid pack lot # [id number]  . CESAREAN SECTION     x 2  . CORONARY ANGIOPLASTY     STENT  . FOOT SURGERY Left    ankle tendon  surgery  . JOINT REPLACEMENT Bilateral    TKR/ RIGHT SHOULDER  . KNEE SURGERY Bilateral    total knee replacements  . SHOULDER SURGERY Right    shoulder replacement  . tumor removed     neck    Allergies: Patient has no known allergies.  Medications: Prior to Admission medications   Medication Sig Start Date End Date Taking? Authorizing Provider  acetaminophen (TYLENOL) 650 MG CR tablet Take 650 mg by mouth 2 (two) times daily.   Yes [provider]  clopidogrel (PLAVIX) 75 MG tablet Take 75 mg by mouth every evening.    Yes [provider]  donepezil (ARICEPT) 5 MG tablet Take 5 mg by mouth daily. 01/08/20  Yes [provider]  empagliflozin (JARDIANCE) 10 MG TABS tablet Take 10 mg by mouth daily.   Yes [provider]  ferrous gluconate ([employer] IRON) 240 (27 FE) MG tablet Take 240 mg by mouth daily.   Yes [provider]  ferrous sulfate 325 (65 FE) MG tablet Take 325 mg by mouth daily with breakfast.   Yes [provider]  irbesartan (AVAPRO) 300 MG tablet Take 150 mg by mouth daily.  06/04/18  Yes [provider]  oxybutynin (DITROPAN) 5 MG tablet Take 5 mg by mouth 2 (two) times daily.   Yes [provider]  pantoprazole (PROTONIX) 40 MG tablet Take 40 mg by mouth daily.   Yes [provider]  Potassium Gluconate 550  MG TABS Take 1 tablet by mouth daily. 595 mg   Yes [provider]  topiramate (TOPAMAX) 50 MG tablet Take 50 mg by mouth daily.  11/06/19  Yes [provider]  vitamin E 400 UNIT capsule Take 400 Units by mouth daily.   Yes [provider]  calcium-vitamin D (OSCAL WITH D) 500-200 MG-UNIT tablet Take 1 tablet by mouth daily with breakfast. Patient not taking: Reported on 01/16/2020    [provider]  hydrochlorothiazide (HYDRODIURIL) 25 MG tablet Take 25 mg by mouth daily. 10/07/19   [provider]     History reviewed. No pertinent family  history.  Social History   Socioeconomic History  . Marital status: Married    Spouse name: Not on file  . Number of children: Not on file  . Years of education: Not on file  . Highest education level: Not on file  Occupational History  . Not on file  Tobacco Use  . Smoking status: Never Smoker  . Smokeless tobacco: Never Used  Vaping Use  . Vaping Use: Never used  Substance and Sexual Activity  . Alcohol use: No  . Drug use: No  . Sexual activity: Not on file  Other Topics Concern  . Not on file  Social History Narrative  . Not on file   Social Determinants of Health   Financial Resource Strain:   . Difficulty of Paying Living Expenses: Not on file  Food Insecurity:   . Worried About Programme researcher, broadcasting/film/video in the Last Year: Not on file  . Ran Out of Food in the Last Year: Not on file  Transportation Needs:   . Lack of Transportation (Medical): Not on file  . Lack of Transportation (Non-Medical): Not on file  Physical Activity:   . Days of Exercise per Week: Not on file  . Minutes of Exercise per Session: Not on file  Stress:   . Feeling of Stress : Not on file  Social Connections:   . Frequency of Communication with Friends and Family: Not on file  . Frequency of Social Gatherings with Friends and Family: Not on file  . Attends Religious Services: Not on file  . Active Member of Clubs or Organizations: Not on file  . Attends Banker Meetings: Not on file  . Marital Status: Not on file    Review of Systems: A 12 point ROS discussed and pertinent positives are indicated in the HPI above.  All other systems are negative.  Review of Systems  Unable to perform ROS: Mental status change    Vital Signs: BP 115/64 (BP Location: Right Arm)   Pulse 88   Temp 97.6 F (36.4 C) (Oral)   Resp 20   Ht 5\' 2"  (1.575 m)   Wt 120 lb (54.4 kg)   SpO2 98%   BMI 21.95 kg/m   Physical Exam Constitutional:      General: She is not in acute distress. HENT:      Nose:     Comments: Cor-trak in place.     Mouth/Throat:     Mouth: Mucous membranes are moist.     Pharynx: Oropharynx is clear.  Cardiovascular:     Rate and Rhythm: Normal rate and regular rhythm.     Pulses: Normal pulses.     Heart sounds: Normal heart sounds.  Pulmonary:     Effort: Pulmonary effort is normal.     Breath sounds: Normal breath sounds.  Abdominal:  General: Bowel sounds are normal.     Palpations: Abdomen is soft.  Skin:    General: Skin is warm and dry.  Neurological:     Mental Status: She is alert.     Comments: Unable to fully assess. She was able to answer some questions appropriately.      Imaging: CT Code Stroke CTA Head W/WO contrast  Addendum Date: 01/16/2020   ADDENDUM REPORT: 01/16/2020 09:13 ADDENDUM: Dr. Roda Shutters called for clarification of an arterial stenosis reference in a subsequent brain MRI report. The patient's right PCA territory infarct is associated with a severe P2 segment stenosis with flow gap. There is also a flow reducing left P1 segment stenosis which is ameliorated by a posterior communicating artery. Electronically Signed   By: Marnee Spring M.D.   On: 01/16/2020 09:13   Result Date: 01/16/2020 CLINICAL DATA:  Facial droop EXAM: CT ANGIOGRAPHY HEAD AND NECK TECHNIQUE: Multidetector CT imaging of the head and neck was performed using the standard protocol during bolus administration of intravenous contrast. Multiplanar CT image reconstructions and MIPs were obtained to evaluate the vascular anatomy. Carotid stenosis measurements (when applicable) are obtained utilizing NASCET criteria, using the distal internal carotid diameter as the denominator. CONTRAST:  75mL OMNIPAQUE IOHEXOL 350 MG/ML SOLN COMPARISON:  None. FINDINGS: CTA NECK FINDINGS SKELETON: There is no bony spinal canal stenosis. No lytic or blastic lesion. OTHER NECK: Normal pharynx, larynx and major salivary glands. No cervical lymphadenopathy. Unremarkable thyroid gland. UPPER  CHEST: No pneumothorax or pleural effusion. No nodules or masses. AORTIC ARCH: There is mild calcific atherosclerosis of the aortic arch. There is no aneurysm, dissection or hemodynamically significant stenosis of the visualized portion of the aorta. Conventional 3 vessel aortic branching pattern. The visualized proximal subclavian arteries are widely patent. RIGHT CAROTID SYSTEM: No dissection, occlusion or aneurysm. Mild atherosclerotic calcification at the carotid bifurcation without hemodynamically significant stenosis. LEFT CAROTID SYSTEM: No dissection, occlusion or aneurysm. There is mixed density atherosclerosis extending into the proximal ICA, resulting in 50% stenosis. VERTEBRAL ARTERIES: Left dominant configuration. Both origins are clearly patent. There is no dissection, occlusion or flow-limiting stenosis to the skull base (V1-V3 segments). CTA HEAD FINDINGS POSTERIOR CIRCULATION: --Vertebral arteries: Normal V4 segments. --Inferior cerebellar arteries: Normal. --Basilar artery: Normal. --Superior cerebellar arteries: Normal. --Posterior cerebral arteries (PCA): Normal. There are bilateral posterior communicating arteries (p-comm) that partially supply the PCAs. ANTERIOR CIRCULATION: --Intracranial internal carotid arteries: Atherosclerotic calcification of the internal carotid arteries at the skull base without hemodynamically significant stenosis. --Anterior cerebral arteries (ACA): Normal. Both A1 segments are present. Patent anterior communicating artery (a-comm). --Middle cerebral arteries (MCA): Normal. VENOUS SINUSES: As permitted by contrast timing, patent. ANATOMIC VARIANTS: None Review of the MIP images confirms the above findings. IMPRESSION: 1. No intracranial arterial occlusion or high-grade stenosis. 2. Approximately 50% stenosis of the proximal left internal carotid artery secondary to mixed density atherosclerosis. Aortic Atherosclerosis (ICD10-I70.0). Electronically Signed: By: Deatra Robinson M.D. On: 01/16/2020 03:08   DG Chest 2 View  Result Date: 01/16/2020 CLINICAL DATA:  Code stroke EXAM: CHEST - 2 VIEW COMPARISON:  10/18/2018 FINDINGS: Lungs are clear.  No pleural effusion or pneumothorax. The heart is normal in size. Degenerative changes of the visualized thoracolumbar spine. Right shoulder arthroplasty. Degenerative changes with posttraumatic deformity of the left shoulder. IMPRESSION: No evidence of acute cardiopulmonary disease. Electronically Signed   By: Charline Bills M.D.   On: 01/16/2020 04:19   CT Code Stroke CTA Neck W/WO contrast  Addendum Date:  01/16/2020   ADDENDUM REPORT: 01/16/2020 09:13 ADDENDUM: Dr. Roda Shutters called for clarification of an arterial stenosis reference in a subsequent brain MRI report. The patient's right PCA territory infarct is associated with a severe P2 segment stenosis with flow gap. There is also a flow reducing left P1 segment stenosis which is ameliorated by a posterior communicating artery. Electronically Signed   By: Marnee Spring M.D.   On: 01/16/2020 09:13   Result Date: 01/16/2020 CLINICAL DATA:  Facial droop EXAM: CT ANGIOGRAPHY HEAD AND NECK TECHNIQUE: Multidetector CT imaging of the head and neck was performed using the standard protocol during bolus administration of intravenous contrast. Multiplanar CT image reconstructions and MIPs were obtained to evaluate the vascular anatomy. Carotid stenosis measurements (when applicable) are obtained utilizing NASCET criteria, using the distal internal carotid diameter as the denominator. CONTRAST:  25mL OMNIPAQUE IOHEXOL 350 MG/ML SOLN COMPARISON:  None. FINDINGS: CTA NECK FINDINGS SKELETON: There is no bony spinal canal stenosis. No lytic or blastic lesion. OTHER NECK: Normal pharynx, larynx and major salivary glands. No cervical lymphadenopathy. Unremarkable thyroid gland. UPPER CHEST: No pneumothorax or pleural effusion. No nodules or masses. AORTIC ARCH: There is mild calcific atherosclerosis  of the aortic arch. There is no aneurysm, dissection or hemodynamically significant stenosis of the visualized portion of the aorta. Conventional 3 vessel aortic branching pattern. The visualized proximal subclavian arteries are widely patent. RIGHT CAROTID SYSTEM: No dissection, occlusion or aneurysm. Mild atherosclerotic calcification at the carotid bifurcation without hemodynamically significant stenosis. LEFT CAROTID SYSTEM: No dissection, occlusion or aneurysm. There is mixed density atherosclerosis extending into the proximal ICA, resulting in 50% stenosis. VERTEBRAL ARTERIES: Left dominant configuration. Both origins are clearly patent. There is no dissection, occlusion or flow-limiting stenosis to the skull base (V1-V3 segments). CTA HEAD FINDINGS POSTERIOR CIRCULATION: --Vertebral arteries: Normal V4 segments. --Inferior cerebellar arteries: Normal. --Basilar artery: Normal. --Superior cerebellar arteries: Normal. --Posterior cerebral arteries (PCA): Normal. There are bilateral posterior communicating arteries (p-comm) that partially supply the PCAs. ANTERIOR CIRCULATION: --Intracranial internal carotid arteries: Atherosclerotic calcification of the internal carotid arteries at the skull base without hemodynamically significant stenosis. --Anterior cerebral arteries (ACA): Normal. Both A1 segments are present. Patent anterior communicating artery (a-comm). --Middle cerebral arteries (MCA): Normal. VENOUS SINUSES: As permitted by contrast timing, patent. ANATOMIC VARIANTS: None Review of the MIP images confirms the above findings. IMPRESSION: 1. No intracranial arterial occlusion or high-grade stenosis. 2. Approximately 50% stenosis of the proximal left internal carotid artery secondary to mixed density atherosclerosis. Aortic Atherosclerosis (ICD10-I70.0). Electronically Signed: By: Deatra Robinson M.D. On: 01/16/2020 03:08   MR BRAIN WO CONTRAST  Result Date: 01/20/2020 CLINICAL DATA:  Stroke, follow-up.  EXAM: MRI HEAD WITHOUT CONTRAST TECHNIQUE: Multiplanar, multiecho pulse sequences of the brain and surrounding structures were obtained without intravenous contrast. COMPARISON:  Brain MRI 01/16/2020 FINDINGS: A limited protocol brain MRI was performed at the ordering provider's request consisting of axial and coronal diffusion-weighted sequences as well as an axial SWI sequence. Not significantly changed in extent, there is predominantly cortical restricted diffusion consistent with early subacute infarction within the right PCA territory affecting the right temporal lobe (including medial right temporal lobe/hippocampus) and right occipital lobe. As before, there is likely mild involvement of the inferior right parietal lobe. Redemonstrated subcentimeter early subacute infarct within the left thalamus. Early subacute infarcts within the left greater than right posterior pontomedullary junction have also not significantly changed in extent. Unchanged mild petechial hemorrhage within portions of the right occipital lobe infarction territory. No interval  acute infarct is identified elsewhere. IMPRESSION: No significant interval change in extent of an early subacute right PCA territory infarct predominantly affecting the right temporal and occipital lobes. Unchanged mild petechial hemorrhage within portions of the right occipital lobe infarction territory. Smaller early subacute infarcts within the left thalamus and left greater than right posterior pontomedullary junction have also not significantly changed in extent. Electronically Signed   By: Jackey LogeKyle  Golden DO   On: 01/20/2020 17:19   MR BRAIN WO CONTRAST  Result Date: 01/16/2020 CLINICAL DATA:  Stroke.  Left facial droop and slurred speech EXAM: MRI HEAD WITHOUT CONTRAST TECHNIQUE: Multiplanar, multiecho pulse sequences of the brain and surrounding structures were obtained without intravenous contrast. COMPARISON:  Head CT 01/16/2020 FINDINGS: Brain: There is  abnormal diffusion restriction within the right PCA territory, predominantly involving the cortex. There is a small focus of acute ischemia in the left thalamus. There is also an acute infarct of the left-greater-than-right dorsal medulla oblongata. Diffuse confluent hyperintense T2-weighted signal within the periventricular, deep and juxtacortical white matter, most commonly due to chronic ischemic microangiopathy. There is generalized atrophy without lobar predilection. No chronic microhemorrhage. Normal midline structures. Vascular: In retrospect, the earlier CTA shows a short segment occlusion of the right P2 segment with distal reconstitution. Major flow voids are normal. Skull and upper cervical spine: Upper cervical degenerative changes. Sinuses/Orbits: Negative. Other: None. IMPRESSION: 1. Acute right PCA territory infarct, predominantly involving the temporal and occipital cortex. 2. Small acute infarct of the left thalamus. 3. Short segment occlusion of the right P2 segment with distal reconstitution demonstrated on earlier CTA. 4. Severe chronic ischemic microangiopathy. Electronically Signed   By: Deatra RobinsonKevin  Herman M.D.   On: 01/16/2020 06:49   DG CHEST PORT 1 VIEW  Result Date: 01/20/2020 CLINICAL DATA:  Cough. EXAM: PORTABLE CHEST 1 VIEW COMPARISON:  01/16/2020. FINDINGS: Feeding tube noted with tip below left hemidiaphragm. Mediastinum and hilar structures normal. Low lung volumes. No focal infiltrate identified. No pleural effusion or pneumothorax. Heart size stable. Mitral annular calcification noted. Coronary stent noted. Carotid calcification noted. Postsurgical changes right shoulder. Stable deformity and severe degenerative changes left shoulder. IMPRESSION: 1.  Feeding tube noted with tip below left hemidiaphragm. 2.  Low lung volumes.  No focal infiltrate identified. 3. Coronary stent noted. Carotid vascular calcification consistent with atherosclerotic vascular disease noted. Electronically  Signed   By: Maisie Fushomas  Register   On: 01/20/2020 10:16   DG Swallowing Func-Speech Pathology  Result Date: 01/17/2020 Objective Swallowing Evaluation: Type of Study: MBS-Modified Barium Swallow Study  Patient Details Name: Marchelle FolksDoris M Bouse MRN: [id number] Date of Birth: 26-Nov-1931 Today's Date: 01/17/2020 Time: SLP Start Time (ACUTE ONLY): 1150 -SLP Stop Time (ACUTE ONLY): 1214 SLP Time Calculation (min) (ACUTE ONLY): 24 min Past Medical History: Past Medical History: Diagnosis Date . Arthritis  . Diabetes (HCC)  . Dyspnea   with exertion . GERD (gastroesophageal reflux disease)  . Hypertension  . Incontinence  . Sleep apnea   uses cpap . Swelling of both lower extremities  . Wheezing  Past Surgical History: Past Surgical History: Procedure Laterality Date . ANKLE SURGERY   . CARDIAC CATHETERIZATION    coronary stent . CARPAL TUNNEL RELEASE Right  . CATARACT EXTRACTION W/PHACO Left 07/29/2017  Procedure: CATARACT EXTRACTION PHACO AND INTRAOCULAR LENS PLACEMENT (IOC);  Surgeon: Galen ManilaPorfilio, William, MD;  Location: [facility] ORS;  Service: Ophthalmology;  Laterality: Left;  [place] 00:38.9 AP% 12.0 CDE 4.68 Fluid Pack Lot # [id number] H . CATARACT EXTRACTION W/PHACO Right 08/19/2017  Procedure: CATARACT EXTRACTION PHACO AND INTRAOCULAR LENS PLACEMENT (IOC);  Surgeon: Galen Manila, MD;  Location: [facility] ORS;  Service: Ophthalmology;  Laterality: Right;  [place] 00:35 AP% 16.0 CDE 5.69 Fluid pack lot # [id number] . CESAREAN SECTION    x 2 . CORONARY ANGIOPLASTY    STENT . FOOT SURGERY Left   ankle tendon surgery . JOINT REPLACEMENT Bilateral   TKR/ RIGHT SHOULDER . KNEE SURGERY Bilateral   total knee replacements . SHOULDER SURGERY Right   shoulder replacement . tumor removed    neck HPI: Pt is an 86 yo female presenting with AMS and L facial droop. MRI showed R lower pontine, R PCA, and L thalamic infarcts. PMH includes: DM, TIAs, GERD, HTN, OSA, wheezing, OA  Subjective: asking for food Assessment / Plan / Recommendation [facility] IP CLINICAL  IMPRESSIONS 01/17/2020 Clinical Impression Pt has a moderate oropahryngeal dysphagia with oral intake quite effortful across trials. She has very limited labial seal so that she is unable to suck from a straw, but she also has trouble sealing her lips around a cup or spoon. Anterior loss is significant with thin liquids, at times only getting a trace amount of liquid in her mouth. Oral holding is more prominent with purees, but her attempts at oral transit once initiated are sluggish and small. Her base of tongue retraction and pharyngeal squeeze are also reduced, limiting full epiglottic inversion and resulting in vallecular residue. Residue also coats her tongue, with most of the residue collecting more posteriorly at the base of her tongue. Thin liquids do help to clear some of this residue, but it takes her multiple bolus attempts until she can keep enough thin liquid in her mouth. Her airway protection is relatively intact though, with trace and transient penetration observed x1. Overall, her swallowing is not efficient to provide a consistent or adequate source of intake at this time. Would offer small, single sips of water with full supervision after oral care to try to facilitate use of swallowing musculature and provide moisture. SLP will f/u for additional therapeutic boluses with potential to initiate PO diet as efficiency improves.  SLP Visit Diagnosis Dysphagia, oropharyngeal phase (R13.12) Attention and concentration deficit following -- Frontal lobe and executive function deficit following -- Impact on safety and function Moderate aspiration risk   [facility] IP TREATMENT RECOMMENDATION 01/17/2020 Treatment Recommendations Therapy as outlined in treatment plan below   Prognosis 01/17/2020 Prognosis for Safe Diet Advancement Good Barriers to Reach Goals Cognitive deficits Barriers/Prognosis Comment -- [facility] IP DIET RECOMMENDATION 01/17/2020 SLP Diet Recommendations NPO;Other (Comment) Liquid Administration via  Spoon;Cup Medication Administration Via alternative means Compensations -- Postural Changes --   [facility] IP OTHER RECOMMENDATIONS 01/17/2020 Recommended Consults -- Oral Care Recommendations Oral care QID Other Recommendations Have oral suction available   [facility] IP FOLLOW UP RECOMMENDATIONS 01/17/2020 Follow up Recommendations Skilled Nursing facility   [facility] IP FREQUENCY AND DURATION 01/17/2020 Speech Therapy Frequency (ACUTE ONLY) min 2x/week Treatment Duration 2 weeks      [facility] IP ORAL PHASE 01/17/2020 Oral Phase Impaired Oral - Pudding Teaspoon -- Oral - Pudding Cup -- Oral - Honey Teaspoon -- Oral - Honey Cup -- Oral - Nectar Teaspoon -- Oral - Nectar Cup -- Oral - Nectar Straw -- Oral - Thin Teaspoon Weak lingual manipulation;Lingual pumping;Reduced posterior propulsion;Holding of bolus;Lingual/palatal residue;Delayed oral transit;Decreased bolus cohesion;Premature spillage;Left anterior bolus loss Oral - Thin Cup Weak lingual manipulation;Lingual pumping;Reduced posterior propulsion;Holding of bolus;Lingual/palatal residue;Delayed oral transit;Decreased bolus cohesion;Premature spillage;Left anterior bolus loss Oral -  Thin Straw -- Oral - Puree Weak lingual manipulation;Lingual pumping;Reduced posterior propulsion;Holding of bolus;Lingual/palatal residue;Delayed oral transit;Decreased bolus cohesion;Premature spillage Oral - Mech Soft -- Oral - Regular -- Oral - Multi-Consistency -- Oral - Pill -- Oral Phase - Comment --  [facility] IP PHARYNGEAL PHASE 01/17/2020 Pharyngeal Phase Impaired Pharyngeal- Pudding Teaspoon -- Pharyngeal -- Pharyngeal- Pudding Cup -- Pharyngeal -- Pharyngeal- Honey Teaspoon -- Pharyngeal -- Pharyngeal- Honey Cup -- Pharyngeal -- Pharyngeal- Nectar Teaspoon -- Pharyngeal -- Pharyngeal- Nectar Cup -- Pharyngeal -- Pharyngeal- Nectar Straw -- Pharyngeal -- Pharyngeal- Thin Teaspoon Reduced tongue base retraction;Reduced pharyngeal peristalsis;Pharyngeal residue - valleculae;Reduced epiglottic inversion  Pharyngeal -- Pharyngeal- Thin Cup Reduced tongue base retraction;Reduced pharyngeal peristalsis;Pharyngeal residue - valleculae;Penetration/Aspiration before swallow;Reduced epiglottic inversion Pharyngeal Material enters airway, remains ABOVE vocal cords then ejected out Pharyngeal- Thin Straw -- Pharyngeal -- Pharyngeal- Puree Reduced tongue base retraction;Reduced pharyngeal peristalsis;Pharyngeal residue - valleculae;Reduced epiglottic inversion;Delayed swallow initiation-vallecula Pharyngeal -- Pharyngeal- Mechanical Soft -- Pharyngeal -- Pharyngeal- Regular -- Pharyngeal -- Pharyngeal- Multi-consistency -- Pharyngeal -- Pharyngeal- Pill -- Pharyngeal -- Pharyngeal Comment --  [facility] IP CERVICAL ESOPHAGEAL PHASE 01/17/2020 Cervical Esophageal Phase WFL Pudding Teaspoon -- Pudding Cup -- Honey Teaspoon -- Honey Cup -- Nectar Teaspoon -- Nectar Cup -- Nectar Straw -- Thin Teaspoon -- Thin Cup -- Thin Straw -- Puree -- Mechanical Soft -- Regular -- Multi-consistency -- Pill -- Cervical Esophageal Comment -- Mahala Menghini., M.A. CCC-SLP Acute Rehabilitation Services Pager [phone] Office [phone] 01/17/2020, 1:46 PM              ECHOCARDIOGRAM COMPLETE  Result Date: 01/17/2020    ECHOCARDIOGRAM REPORT   Patient Name:   NIKKO QUAST Date of Exam: 01/17/2020 Medical Rec #:  [id number]       Height:       62.0 in Accession #:    [id number]      Weight:       118.6 lb Date of Birth:  06/05/1931      BSA:          1.531 m Patient Age:    87 years        BP:           134/62 mmHg Patient Gender: F               HR:           72 bpm. Exam Location:  Inpatient Procedure: 2D Echo Indications:    Stroke I163.9  History:        Patient has prior history of Echocardiogram examinations. Risk                 Factors:Hypertension and Diabetes.  Sonographer:    Thurman Coyer RDCS (AE) Referring Phys: [id number] MICHAEL E NORINS IMPRESSIONS  1. Left ventricular ejection fraction, by estimation, is 60 to 65%. The left ventricle has  normal function. The left ventricle has no regional wall motion abnormalities. Left ventricular diastolic parameters are consistent with Grade II diastolic dysfunction (pseudonormalization). Elevated left ventricular end-diastolic pressure.  2. Right ventricular systolic function is normal. The right ventricular size is normal. There is moderately elevated pulmonary artery systolic pressure. The estimated right ventricular systolic pressure is 60.8 mmHg.  3. Left atrial size was severely dilated.  4. The mitral valve is normal in structure. Mild mitral valve regurgitation. Moderate mitral stenosis. The mean mitral valve gradient is 8.0 mmHg.  5. Tricuspid valve regurgitation is moderate.  6. The aortic valve is tricuspid. Aortic valve regurgitation  is not visualized. Moderate aortic valve stenosis. Aortic valve mean gradient measures 31.0 mmHg. Aortic valve Vmax measures 3.56 m/s.  7. The inferior vena cava is normal in size with greater than 50% respiratory variability, suggesting right atrial pressure of 3 mmHg. FINDINGS  Left Ventricle: Left ventricular ejection fraction, by estimation, is 60 to 65%. The left ventricle has normal function. The left ventricle has no regional wall motion abnormalities. The left ventricular internal cavity size was normal in size. There is  no left ventricular hypertrophy. Left ventricular diastolic parameters are consistent with Grade II diastolic dysfunction (pseudonormalization). Elevated left ventricular end-diastolic pressure. Right Ventricle: The right ventricular size is normal. No increase in right ventricular wall thickness. Right ventricular systolic function is normal. There is moderately elevated pulmonary artery systolic pressure. The tricuspid regurgitant velocity is 3.80 m/s, and with an assumed right atrial pressure of 3 mmHg, the estimated right ventricular systolic pressure is 60.8 mmHg. Left Atrium: Left atrial size was severely dilated. Right Atrium: Right atrial  size was normal in size. Pericardium: There is no evidence of pericardial effusion. Mitral Valve: The mitral valve is normal in structure. There is moderate thickening of the mitral valve leaflet(s). Normal mobility of the mitral valve leaflets. Severe mitral annular calcification. Mild mitral valve regurgitation. Moderate mitral valve stenosis. MV peak gradient, 17.1 mmHg. The mean mitral valve gradient is 8.0 mmHg. Tricuspid Valve: The tricuspid valve is normal in structure. Tricuspid valve regurgitation is moderate . No evidence of tricuspid stenosis. Aortic Valve: The aortic valve is tricuspid. . There is severe thickening of the aortic valve. Aortic valve regurgitation is not visualized. Moderate aortic stenosis is present. Severe aortic valve annular calcification. There is severe thickening of the  aortic valve. Aortic valve mean gradient measures 31.0 mmHg. Aortic valve peak gradient measures 50.7 mmHg. Aortic valve area, by VTI measures 0.69 cm. Pulmonic Valve: The pulmonic valve was normal in structure. Pulmonic valve regurgitation is not visualized. No evidence of pulmonic stenosis. Aorta: The aortic root is normal in size and structure. Venous: The inferior vena cava is normal in size with greater than 50% respiratory variability, suggesting right atrial pressure of 3 mmHg. IAS/Shunts: No atrial level shunt detected by color flow Doppler.  LEFT VENTRICLE PLAX 2D LVIDd:         3.90 cm  Diastology LVIDs:         2.30 cm  LV e' lateral:   4.26 cm/s LV PW:         0.90 cm  LV E/e' lateral: 44.1 LV IVS:        1.00 cm  LV e' medial:    3.30 cm/s LVOT diam:     1.80 cm  LV E/e' medial:  57.0 LV SV:         58 LV SV Index:   38 LVOT Area:     2.54 cm  RIGHT VENTRICLE RV S prime:     12.30 cm/s TAPSE (M-mode): 2.3 cm LEFT ATRIUM             Index       RIGHT ATRIUM           Index LA diam:        3.70 cm 2.42 cm/m  RA Area:     11.80 cm LA Vol (A2C):   82.4 ml 53.82 ml/m RA Volume:   22.40 ml  14.63 ml/m  LA Vol (A4C):   75.0 ml 48.99 ml/m LA Biplane Vol: 78.7 ml  51.40 ml/m  AORTIC VALVE AV Area (Vmax):    0.61 cm AV Area (Vmean):   0.54 cm AV Area (VTI):     0.69 cm AV Vmax:           356.00 cm/s AV Vmean:          263.000 cm/s AV VTI:            0.839 m AV Peak Grad:      50.7 mmHg AV Mean Grad:      31.0 mmHg LVOT Vmax:         85.00 cm/s LVOT Vmean:        55.300 cm/s LVOT VTI:          0.229 m LVOT/AV VTI ratio: 0.27  AORTA Ao Root diam: 2.90 cm MITRAL VALVE                TRICUSPID VALVE MV Area (PHT): 2.39 cm     TR Peak grad:   57.8 mmHg MV Peak grad:  17.1 mmHg    TR Vmax:        380.00 cm/s MV Mean grad:  8.0 mmHg MV Vmax:       2.07 m/s     SHUNTS MV Vmean:      130.0 cm/s   Systemic VTI:  0.23 m MV Decel Time: 317 msec     Systemic Diam: 1.80 cm MV E velocity: 188.00 cm/s MV A velocity: 126.00 cm/s MV E/A ratio:  1.49 Armanda Magic MD Electronically signed by Armanda Magic MD Signature Date/Time: 01/17/2020/12:06:26 PM    Final    CT HEAD CODE STROKE WO CONTRAST  Result Date: 01/16/2020 CLINICAL DATA:  Code stroke.  Facial droop EXAM: CT HEAD WITHOUT CONTRAST TECHNIQUE: Contiguous axial images were obtained from the base of the skull through the vertex without intravenous contrast. COMPARISON:  10/18/2018 FINDINGS: Brain: There is no mass, hemorrhage or extra-axial collection. There is generalized atrophy without lobar predilection. There is hypoattenuation of the periventricular white matter, most commonly indicating chronic ischemic microangiopathy. Vascular: Atherosclerotic calcification of the internal carotid arteries at the skull base. No abnormal hyperdensity of the major intracranial arteries or dural venous sinuses. Skull: The visualized skull base, calvarium and extracranial soft tissues are normal. Sinuses/Orbits: No fluid levels or advanced mucosal thickening of the visualized paranasal sinuses. No mastoid or middle ear effusion. The orbits are normal. ASPECTS [facility] Stroke Program  Early CT Score) - Ganglionic level infarction (caudate, lentiform nuclei, internal capsule, insula, M1-M3 cortex): 7 - Supraganglionic infarction (M4-M6 cortex): 3 Total score (0-10 with 10 being normal): 10 IMPRESSION: 1. No acute intracranial abnormality. 2. ASPECTS is 10. These results were communicated to Dr. Ritta Slot at 2:35 am on 01/16/2020 by text page via the [facility] messaging system. Electronically Signed   By: Deatra Robinson M.D.   On: 01/16/2020 02:35   VAS [place] LOWER EXTREMITY VENOUS (DVT)  Result Date: 01/18/2020  Lower Venous DVTStudy Indications: Stroke.  Limitations: Altered mental status/poor cooperation. Comparison Study: Prior negative study done 11/23/16 is available for comparison Performing Technologist: Sherren Kerns RVS  Examination Guidelines: A complete evaluation includes B-mode imaging, spectral Doppler, color Doppler, and power Doppler as needed of all accessible portions of each vessel. Bilateral testing is considered an integral part of a complete examination. Limited examinations for reoccurring indications may be performed as noted. The reflux portion of the exam is performed with the patient in reverse Trendelenburg.  +---------+---------------+---------+-----------+----------+--------------+ RIGHT    CompressibilityPhasicitySpontaneityPropertiesThrombus Aging +---------+---------------+---------+-----------+----------+--------------+  CFV      Full           Yes      Yes                                 +---------+---------------+---------+-----------+----------+--------------+ SFJ      Full                                                        +---------+---------------+---------+-----------+----------+--------------+ FV Prox  Full                                                        +---------+---------------+---------+-----------+----------+--------------+ FV Mid   Full                                                         +---------+---------------+---------+-----------+----------+--------------+ FV DistalFull                                                        +---------+---------------+---------+-----------+----------+--------------+ PFV      Full                                                        +---------+---------------+---------+-----------+----------+--------------+ POP      Full           Yes      Yes                                 +---------+---------------+---------+-----------+----------+--------------+ PTV      Full                                                        +---------+---------------+---------+-----------+----------+--------------+ PERO     Full                                                        +---------+---------------+---------+-----------+----------+--------------+   +---------+---------------+---------+-----------+----------+-------------------+ LEFT     CompressibilityPhasicitySpontaneityPropertiesThrombus Aging      +---------+---------------+---------+-----------+----------+-------------------+ CFV      Full           Yes      Yes                                      +---------+---------------+---------+-----------+----------+-------------------+  SFJ      Full                                                             +---------+---------------+---------+-----------+----------+-------------------+ FV Prox  Full                                                             +---------+---------------+---------+-----------+----------+-------------------+ FV Mid   Full                                                             +---------+---------------+---------+-----------+----------+-------------------+ FV DistalFull                                                             +---------+---------------+---------+-----------+----------+-------------------+ PFV      Full                                                              +---------+---------------+---------+-----------+----------+-------------------+ POP                                                   patent by color and                                                       Doppler             +---------+---------------+---------+-----------+----------+-------------------+ PTV      Full                                                             +---------+---------------+---------+-----------+----------+-------------------+ PERO     Full                                                             +---------+---------------+---------+-----------+----------+-------------------+     Summary: RIGHT: - Findings appear essentially unchanged  compared to previous examination. - There is no evidence of deep vein thrombosis in the lower extremity.  LEFT: - Findings appear essentially unchanged compared to previous examination. - There is no evidence of deep vein thrombosis in the lower extremity. However, portions of this examination were limited- see technologist comments above.  *See table(s) above for measurements and observations. Electronically signed by Waverly Ferrari MD on 01/18/2020 at 4:09:45 PM.    Final     Labs:  CBC: Recent Labs    01/22/20 0256 01/24/20 0322 01/25/20 0358 01/26/20 0207  WBC 3.5* 2.8* 2.7* 3.4*  HGB 10.4* 9.9* 10.1* 10.9*  HCT 33.9* 31.5* 32.2* 34.8*  PLT 192 240 245 277    COAGS: Recent Labs    01/16/20 0220  INR 1.1  APTT 30    BMP: Recent Labs    01/22/20 0256 01/22/20 0256 01/23/20 0118 01/24/20 0322 01/25/20 0358 01/26/20 0207  NA 142  --   --  142 141 140  K 3.9  --   --  4.4 4.2 4.4  CL 111  --   --  109 108 108  CO2 21*  --   --  GLUCOSE 120*  --   --  146* 131* 121*  BUN 15  --   --  CALCIUM 9.5  --   --  9.4 9.6 9.7  CREATININE 0.75   < > 0.88 0.78 0.76 0.80  GFRNONAA >60   < > 60* >60 >60 >60  GFRAA >60   < > >60 >60 >60 >60   < > =  values in this interval not displayed.    LIVER FUNCTION TESTS: Recent Labs    01/22/20 0256 01/24/20 0322 01/25/20 0358 01/26/20 0207  BILITOT 0.5 0.3 0.4 0.4  AST 37  ALT ALKPHOS 71 71 70 78  PROT 5.0* 4.8* 4.9* 4.9*  ALBUMIN 2.5* 2.4* 2.4* 2.5*    TUMOR MARKERS: No results for input(s): AFPTM, CEA, CA199, CHROMGRNA in the last 8760 hours.  Assessment and Plan:  Acute CVA; dysphagia: Sharyn Lull. Vokes, 86 year old female, is tentatively scheduled for an image-guided gastrostomy tube placement 02/01/20. She has been on Plavix, last dose 01/26/20 at 1745. Plavix needs to be held at least 5 days before this procedure can be done. This was discussed with Dr. Rito Ehrlich.  Tube feeds will need to be stopped at midnight the day of the procedure. Lovenox will also need to be held for one dose prior to the procedure. AM labs will be ordered for the morning of the procedure.   Consent obtained from the patient's daughter, Avila Albritton.   Risks and benefits image guided gastrostomy tube placement were discussed with the patient's daughter including, but not limited to the need for a barium enema during the procedure, bleeding, infection, peritonitis and/or damage to adjacent structures.  All of the patient's daughter's questions were answered, patient's daughter is agreeable to proceed.  Consent signed and in the IR control room.   Thank you for this interesting consult.  I greatly enjoyed meeting EDLIN FORD and look forward to participating in their care.  A copy of this report was sent to the requesting provider on this date.  Electronically Signed: Alwyn Ren, AGACNP-BC [phone] 01/27/2020, 3:04 PM   I spent a total of 20 Minutes    in face to face in clinical consultation, greater than 50% of which was counseling/coordinating  care for gastrostomy tube.

## 2020-01-27 NOTE — Progress Notes (Signed)
Physical Therapy Treatment Patient Details Name: Sydney Schultz MRN: [id number] DOB: 1931/06/27 Today's Date: 01/27/2020    History of Present Illness Pt is a 86 y/o female with PMH of DM, HTN, OA; presenting with AMS and progressive decline in ADLS, mobility then L facial droop/slurred speech 9/4. MRI reveals acute lower pontine, R PCA territory infarct, L thalamic infarcts- embolic pattern.     PT Comments    Patient received in bed, daughter present. She is agreeable to PT session. Alert, pleasant this morning. Patient requires max +2 assist for supine to sit. Poor initiation of movement. She is unable to sit unsupported without lob. Sit to stand performed with +2 max assist and she was able to take 2-3 steps with RW and max assist. Poor ability to advance LEs standing with flexed posture and elbows flexed. Patient assisted to recliner. Then attempted to stand and walk again but was unable to get standing without total assist +2. Patient will continue to benefit from skilled PT while here to improve functional independence and strength.           Follow Up Recommendations  SNF;Supervision/Assistance - 24 hour;Other (comment)     Equipment Recommendations  None recommended by PT    Recommendations for Other Services       Precautions / Restrictions Precautions Precautions: Fall Precaution Comments: NG tube Restrictions Weight Bearing Restrictions: No    Mobility  Bed Mobility Overal bed mobility: Needs Assistance Bed Mobility: Supine to Sit     Supine to sit: Max assist;+2 for physical assistance;HOB elevated     General bed mobility comments: requires total truncal assist, unable to maintain upright sitting without assist. Leaning on either R or L elbow  Transfers Overall transfer level: Needs assistance Equipment used: Rolling walker (2 wheeled) Transfers: Sit to/from Stand Sit to Stand: Max assist;+2 physical assistance         General transfer comment: cues  for hand placement and both assist for coming forward and boost.  Ambulation/Gait Ambulation/Gait assistance: Max assist Gait Distance (Feet): 2 Feet Assistive device: Rolling walker (2 wheeled) Gait Pattern/deviations: Step-to pattern;Decreased stride length;Decreased step length - right;Decreased step length - left;Shuffle;Trunk flexed;Narrow base of support Gait velocity: decr   General Gait Details: patient with difficulty advancing L LE, standing with max flexed posture, bent elbows. Unable to straighten  up despite cues.   Stairs             Wheelchair Mobility    Modified Rankin (Stroke Patients Only) Modified Rankin (Stroke Patients Only) Pre-Morbid Rankin Score: Moderately severe disability Modified Rankin: Moderately severe disability     Balance Overall balance assessment: Needs assistance Sitting-balance support: Feet supported;No upper extremity supported Sitting balance-Leahy Scale: Zero Sitting balance - Comments: Patient unable to maintin static standing without external assist this day Postural control: Right lateral lean;Left lateral lean;Posterior lean Standing balance support: During functional activity;No upper extremity supported;Bilateral upper extremity supported Standing balance-Leahy Scale: Poor Standing balance comment: required both AD and external stability assist                            Cognition Arousal/Alertness: Awake/alert Behavior During Therapy: WFL for tasks assessed/performed Overall Cognitive Status: Impaired/Different from baseline Area of Impairment: Orientation;Following commands;Safety/judgement;Awareness;Problem solving                 Orientation Level: Disoriented to;Place;Time;Situation Current Attention Level: Focused Memory: Decreased short-term memory Following Commands: Follows one step commands inconsistently;Follows  one step commands with increased time Safety/Judgement: Decreased awareness of  safety;Decreased awareness of deficits Awareness: Emergent Problem Solving: Requires verbal cues;Requires tactile cues;Slow processing;Difficulty sequencing General Comments: Patient alert, pleasant, requires max verbal and tactile cues to follow direction and then still does not always do what is asked      Exercises      General Comments        Pertinent Vitals/Pain Pain Assessment: No/denies pain    Home Living                      Prior Function            PT Goals (current goals can now be found in the care plan section) Acute Rehab PT Goals Patient Stated Goal: daughter wants to take her home but torn because she knows she will get more therapy at SNF but may not be able to visit her there PT Goal Formulation: With family Time For Goal Achievement: 01/31/20 Potential to Achieve Goals: Fair Progress towards PT goals: Progressing toward goals    Frequency    Min 3X/week      PT Plan Current plan remains appropriate    Co-evaluation              AM-PAC PT "6 Clicks" Mobility   Outcome Measure  Help needed turning from your back to your side while in a flat bed without using bedrails?: A Lot Help needed moving from lying on your back to sitting on the side of a flat bed without using bedrails?: Total Help needed moving to and from a bed to a chair (including a wheelchair)?: A Lot Help needed standing up from a chair using your arms (e.g., wheelchair or bedside chair)?: A Lot Help needed to walk in hospital room?: Total Help needed climbing 3-5 steps with a railing? : Total 6 Click Score: 9    End of Session Equipment Utilized During Treatment: Gait belt Activity Tolerance: Patient limited by lethargy;Patient limited by fatigue Patient left: in chair;with chair alarm set;with restraints reapplied Nurse Communication: Mobility status PT Visit Diagnosis: Unsteadiness on feet (R26.81);Difficulty in walking, not elsewhere classified (R26.2);Other  symptoms and signs involving the nervous system (R29.898);Hemiplegia and hemiparesis;Other abnormalities of gait and mobility (R26.89);Muscle weakness (generalized) (M62.81) Hemiplegia - Right/Left: Left Hemiplegia - dominant/non-dominant: Non-dominant Hemiplegia - caused by: Cerebral infarction     Time: 1040-1109 PT Time Calculation (min) (ACUTE ONLY): 29 min  Charges:  $Gait Training: 8-22 mins $Therapeutic Activity: 8-22 mins                     Khiree Bukhari, PT, GCS 01/27/20,11:33 AM

## 2020-01-28 LAB — GLUCOSE, CAPILLARY
Glucose-Capillary: 110 mg/dL — ABNORMAL HIGH (ref 70–99)
Glucose-Capillary: 124 mg/dL — ABNORMAL HIGH (ref 70–99)
Glucose-Capillary: 131 mg/dL — ABNORMAL HIGH (ref 70–99)
Glucose-Capillary: 132 mg/dL — ABNORMAL HIGH (ref 70–99)
Glucose-Capillary: 138 mg/dL — ABNORMAL HIGH (ref 70–99)
Glucose-Capillary: 145 mg/dL — ABNORMAL HIGH (ref 70–99)
Glucose-Capillary: 146 mg/dL — ABNORMAL HIGH (ref 70–99)

## 2020-01-28 MED ORDER — GERHARDT'S BUTT CREAM
TOPICAL_CREAM | Freq: Three times a day (TID) | CUTANEOUS | Status: DC
  Administered 2020-01-28: 1 via TOPICAL
  Filled 2020-01-28: qty 1

## 2020-01-28 NOTE — Progress Notes (Signed)
Occupational Therapy Treatment Patient Details Name: Sydney Schultz MRN: [id number] DOB: 10/20/1931 Today's Date: 01/28/2020    History of present illness Pt is a 86 y/o female with PMH of DM, HTN, OA; presenting with AMS and progressive decline in ADLS, mobility then L facial droop/slurred speech 9/4. MRI reveals acute lower pontine, R PCA territory infarct, L thalamic infarcts- embolic pattern.    OT comments  Pt. Seen for skilled OT.  Pt.s dtr. Present for session and active in learning.  Educated in and had pt. Return demo of BUE for cont. Strengthening and endurance in prep for functional mobility and adl completion.  Pt. Tolerated well.  Reviewed with pt. And dtr. Completion throughout the day.  Both verbalized understanding.    Follow Up Recommendations       Equipment Recommendations  Wheelchair (measurements OT);Wheelchair cushion (measurements OT);Other (comment);Hospital bed    Recommendations for Other Services      Precautions / Restrictions Precautions Precautions: Fall Precaution Comments: NG tube Restrictions Weight Bearing Restrictions: No       Mobility Bed Mobility                  Transfers                      Balance                                           ADL either performed or assessed with clinical judgement   ADL                                               Vision       Perception     Praxis      Cognition Arousal/Alertness: Awake/alert Behavior During Therapy: [facility] for tasks assessed/performed                                            Exercises General Exercises - Upper Extremity Shoulder Flexion: AROM;Both;10 reps;Supine Shoulder Extension: AROM;Both;10 reps;Supine Elbow Flexion: AROM;Both;10 reps;Supine Elbow Extension: AROM;Both;10 reps;Supine Wrist Flexion: AROM;Both;10 reps;Supine Wrist Extension: AROM;10 reps;Both;Supine Digit Composite Flexion:  AROM;Both;10 reps;Supine Composite Extension: AROM;Both;10 reps;Supine   Shoulder Instructions       General Comments      Pertinent Vitals/ Pain       Pain Assessment: No/denies pain  Home Living                                          Prior Functioning/Environment              Frequency  Min 2X/week        Progress Toward Goals  OT Goals(current goals can now be found in the care plan section)  Progress towards OT goals: Progressing toward goals     Plan Discharge plan remains appropriate    Co-evaluation                 AM-PAC OT "6 Clicks" Daily Activity     Outcome Measure  Help from another person eating meals?: Total Help from another person taking care of personal grooming?: A Lot Help from another person toileting, which includes using toliet, bedpan, or urinal?: A Lot Help from another person bathing (including washing, rinsing, drying)?: A Lot Help from another person to put on and taking off regular upper body clothing?: A Lot Help from another person to put on and taking off regular lower body clothing?: Total 6 Click Score: 10    End of Session    OT Visit Diagnosis: Other abnormalities of gait and mobility (R26.89);Muscle weakness (generalized) (M62.81);Other symptoms and signs involving the nervous system (R29.898)   Activity Tolerance Patient tolerated treatment well   Patient Left in bed;with call bell/phone within reach;with bed alarm set;with family/visitor present   Nurse Communication          Time: 4103-0131 OT Time Calculation (min): 20 min  Charges: OT General Charges $OT Visit: 1 Visit OT Treatments $Therapeutic Exercise: 8-22 mins  Sydney Schultz, COTA/L Acute Rehabilitation [phone]   Robet Leu 01/28/2020, 12:29 PM

## 2020-01-28 NOTE — Progress Notes (Signed)
PROGRESS NOTE    Sydney Schultz  HUT:[id number] DOB: April 22, 1932 DOA: 01/16/2020 PCP: Jaclyn Shaggy, MD    Brief Narrative:  86 y.o.femalewith medical history significant ofDM, HTN, OSA and OA. She has been followed by neurology at [facility] with last OV 02/17/19 for f/u of evaluation for change in consciousness episode. Evaluation was non-focal. MRI at that time revealed cerebral atrophy and 2 old lacuanr infarcts right cerebral hemisphere. Patient has had progressive decline and prior to the precipitating event required assistance with dressing, toileting and mobility. On 01/15/20 she had increased weakness and around 5 PM was noted to hve developed a left facial droop and slurred speech. EMS was called and patient was brought to [facility] for further evaluation.  Assessment & Plan:   Principal Problem:   CVA (cerebral vascular accident) (HCC) Active Problems:   DM (diabetes mellitus) (HCC)   HTN (hypertension)   GERD (gastroesophageal reflux disease)   OSA (obstructive sleep apnea)   Malnutrition of moderate degree   Acute CVA (cerebral vascular accident) - possibly embolic MRI with acute R PCA territory infarct predominantly involving the temporal and occipital cortex.  Small acute infarct of L thalamus.  Short segment occlusion of the R P2 segment with distal reconstitution demonstrated on earlier CTA.  Severe chronic microangiopathy. Per neurology the stroke is embolic pattern.  Source is unclear. Recommending 30 day cardiac event monitor to r/o atrial fibrillation.  Message was sent to cardiology to arrange.  Will need to remind them prior to discharge. LDL 131. Zocor 40 + zetia 10 - A1c 5.6 - Continue DAPT x 3 months and then plavix alone  From a neurological standpoint patient seems to be stable.  Her Plavix has been held for placement of a PEG tube.  This was discussed with neurology.  Continue aspirin.  Dysphagia:  Cortrak placed 9/8. SLP continues to follow.  Recommendation for PEG for prolonged supplemental nutrition while undergoing ST.  Discussions were held with patient's daughters.  They were agreeable to proceed with PEG tube placement after initial hesitation.  Interventional radiology was consulted.  Plavix has to be held for 5 days.  Plan is for PEG tube placement on Tuesday. Patient noted to be on a dysphagia 1 diet at this time.    Acute Metabolic Encephalopathy:  -Unresponsive spells per neurology note.  Following with Dr. Malvin Johns and NP Morrie Sheldon at [facility]. -Lethargy on 9/9 may have been 2/2 UTI Mentation has been stable for the past several days.  UTI with E. 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  Patient was on Jardiance at home.  Currently just on SSI.  CBGs are reasonably well controlled.  Recheck labs tomorrow.  Essential hypertension  Noted to be stable.  At home she was noted to be on irbesartan and HCTZ.  Hold off on resuming her home medications since her blood pressure is low normal to begin with.  Dementia Continue Aricept  CKD3a Has been stable.  Check her labs tomorrow.  Monitor urine output.  Cough: CXR performed earlier without focal infiltrate.  Will continue to monitor.  SLP following  Normocytic anemia Likely anemia of chronic disease.  No overt bleeding.  Hemoglobin is stable.  DVT prophylaxis: Lovenox subq Code Status: DNR Family Communication: Discussed with patient's daughter who was at the bedside. Disposition: Plan is for patient to go to skilled  nursing facility when medically stable.  Would like for PEG tube to be inserted prior to discharge  Status is: Inpatient  Remains inpatient appropriate because:Ongoing diagnostic testing needed not appropriate for outpatient work up and Unsafe d/c plan   Dispo: The patient is from:  Home              Anticipated d/c is to: SNF              Anticipated d/c date is: 3 days              Patient currently is not medically stable to d/c.    Consultants:   Neurology  Interventional radiology  Procedures:     Antimicrobials: Anti-infectives (From admission, onward)   Start     Dose/Rate Route Frequency Ordered Stop   02/01/20 0000  ceFAZolin (ANCEF) IVPB 2g/100 mL premix        2 g 200 mL/hr over 30 Minutes Intravenous  Once 01/27/20 1531     01/20/20 1930  cefTRIAXone (ROCEPHIN) 1 g in sodium chloride 0.9 % 100 mL IVPB        1 g 200 mL/hr over 30 Minutes Intravenous Every 24 hours 01/20/20 1901 01/25/20 1959      Subjective: Patient remains pleasantly confused and distracted.  No new complaints offered.  Slept reasonably well overnight.    Objective: Vitals:   01/27/20 1947 01/27/20 2356 01/28/20 0350 01/28/20 0842  BP: 127/70 (!) 118/57 127/71 (!) 116/58  Pulse: 83 81 86 81  Resp: 18 18 17 18   Temp: 98.1 F (36.7 C) 97.8 F (36.6 C) 98.2 F (36.8 C) 98 F (36.7 C)  TempSrc: Oral  Oral Oral  SpO2: 100% 100% 100% 100%  Weight:      Height:        Intake/Output Summary (Last 24 hours) at 01/28/2020 0920 Last data filed at 01/28/2020 0504 Gross per 24 hour  Intake --  Output 1350 ml  Net -1350 ml   Filed Weights   01/16/20 0308 01/24/20 0407 01/26/20 0500  Weight: 53.8 kg 57.6 kg 54.4 kg    Examination:  General appearance: Awake alert.  In no distress.  Remains distracted Resp: Clear to auscultation bilaterally.  Normal effort Cardio: S1-S2 is normal regular.  No S3-S4.  No rubs murmurs or bruit GI: Abdomen is soft.  Nontender nondistended.  Bowel sounds are present normal.  No masses organomegaly    Data Reviewed: I have personally reviewed following labs and imaging studies  CBC: Recent Labs  Lab 01/22/20 0256 01/24/20 0322 01/25/20 0358 01/26/20 0207  WBC 3.5* 2.8* 2.7* 3.4*  NEUTROABS 1.8  --   --   --   HGB 10.4* 9.9*  10.1* 10.9*  HCT 33.9* 31.5* 32.2* 34.8*  MCV 95.0 95.5 96.1 95.9  PLT 192 240 245 277   Basic Metabolic Panel: Recent Labs  Lab 01/22/20 0256 01/23/20 0118 01/24/20 0322 01/25/20 0358 01/26/20 0207  NA 142  --  142 141 140  K 3.9  --  4.4 4.2 4.4  CL 111  --  109 108 108  CO2 21*  --  25 26 26   GLUCOSE 120*  --  146* 131* 121*  BUN 15  --  15 17 18   CREATININE 0.75 0.88 0.78 0.76 0.80  CALCIUM 9.5  --  9.4 9.6 9.7  MG 1.9  --   --   --   --   PHOS 2.4*  --   --   --   --  GFR: Estimated Creatinine Clearance: 40.7 mL/min (by C-G formula based on SCr of 0.8 mg/dL). Liver Function Tests: Recent Labs  Lab 01/22/20 0256 01/24/20 0322 01/25/20 0358 01/26/20 0207  AST 25 24 28  37  ALT 22 19 20 24   ALKPHOS 71 71 70 78  BILITOT 0.5 0.3 0.4 0.4  PROT 5.0* 4.8* 4.9* 4.9*  ALBUMIN 2.5* 2.4* 2.4* 2.5*   CBG: Recent Labs  Lab 01/27/20 1138 01/27/20 1932 01/28/20 0010 01/28/20 0406 01/28/20 0848  GLUCAP 127* 111* 132* 131* 146*     Recent Results (from the past 240 hour(s))  Culture, Urine     Status: Abnormal   Collection Time: 01/20/20  6:00 PM   Specimen: Urine, Random  Result Value Ref Range Status   Specimen Description URINE, RANDOM  Final   Special Requests   Final    NONE Performed at [facility], [address] [place], [address] [place]    Culture >=100,000 COLONIES/mL ESCHERICHIA COLI (A)  Final   Report Status 01/23/2020 FINAL  Final   Organism ID, Bacteria ESCHERICHIA COLI (A)  Final      Susceptibility   Escherichia coli - MIC*    AMPICILLIN <=2 SENSITIVE Sensitive     CEFAZOLIN <=4 SENSITIVE Sensitive     CEFTRIAXONE <=0.25 SENSITIVE Sensitive     CIPROFLOXACIN <=0.25 SENSITIVE Sensitive     GENTAMICIN <=1 SENSITIVE Sensitive     IMIPENEM <=0.25 SENSITIVE Sensitive     NITROFURANTOIN <=16 SENSITIVE Sensitive     TRIMETH/SULFA <=20 SENSITIVE Sensitive     AMPICILLIN/SULBACTAM <=2 SENSITIVE Sensitive     PIP/TAZO <=4 SENSITIVE  Sensitive     * >=100,000 COLONIES/mL ESCHERICHIA COLI     Radiology Studies: No results found.  Scheduled Meds: .  stroke: mapping our early stages of recovery book   Does not apply Once  . acetaminophen (TYLENOL) oral liquid 160 mg/5 mL  650 mg Per Tube BID  . artificial tears   Left Eye QHS  . aspirin  325 mg Per Tube Daily  . [START ON 02/02/2020] clopidogrel  75 mg Per Tube QPM  . colestipol  2 g Per Tube QPM  . enoxaparin (LOVENOX) injection  40 mg Subcutaneous Q24H  . simvastatin  40 mg Per Tube q1800   And  . ezetimibe  10 mg Per Tube q1800  . free water  100 mL Per Tube Q6H  . Gerhardt's butt cream   Topical TID  . insulin aspart  0-9 Units Subcutaneous Q4H  . oxybutynin  5 mg Per Tube BID  . pantoprazole sodium  40 mg Per Tube Daily  . polyvinyl alcohol  1 drop Left Eye QID   Continuous Infusions: . [START ON 02/01/2020]  ceFAZolin (ANCEF) IV    . feeding supplement (OSMOLITE 1.2 CAL) 1,000 mL (01/27/20 1729)     LOS: 12 days   02/03/2020, MD [facility] Pager On Amion  If 7PM-7AM, please contact night-coverage 01/28/2020, 9:20 AM

## 2020-01-28 NOTE — Plan of Care (Signed)
Pt is alert and confused. TF is patent and running. Tele is in place.   Problem: Education: Goal: Knowledge of patient specific risk factors addressed and post discharge goals established will improve Outcome: Progressing   Problem: Education: Goal: Knowledge of disease or condition will improve Outcome: Progressing Goal: Knowledge of secondary prevention will improve Outcome: Progressing Goal: Knowledge of patient specific risk factors addressed and post discharge goals established will improve Outcome: Progressing   Problem: Coping: Goal: Will verbalize positive feelings about self Outcome: Progressing Goal: Will identify appropriate support needs Outcome: Progressing   Problem: Health Behavior/Discharge Planning: Goal: Ability to manage health-related needs will improve Outcome: Progressing   Problem: Self-Care: Goal: Ability to participate in self-care as condition permits will improve Outcome: Progressing Goal: Verbalization of feelings and concerns over difficulty with self-care will improve Outcome: Progressing   Problem: Nutrition: Goal: Risk of aspiration will decrease Outcome: Progressing Goal: Dietary intake will improve Outcome: Progressing   Problem: Intracerebral Hemorrhage Tissue Perfusion: Goal: Complications of Intracerebral Hemorrhage will be minimized Outcome: Progressing   Problem: Ischemic Stroke/TIA Tissue Perfusion: Goal: Complications of ischemic stroke/TIA will be minimized Outcome: Progressing   Problem: Spontaneous Subarachnoid Hemorrhage Tissue Perfusion: Goal: Complications of Spontaneous Subarachnoid Hemorrhage will be minimized Outcome: Progressing   Problem: Education: Goal: Knowledge of General Education information will improve Description: Including pain rating scale, medication(s)/side effects and non-pharmacologic comfort measures Outcome: Progressing   Problem: Health Behavior/Discharge Planning: Goal: Ability to manage  health-related needs will improve Outcome: Progressing   Problem: Clinical Measurements: Goal: Ability to maintain clinical measurements within normal limits will improve Outcome: Progressing Goal: Will remain free from infection Outcome: Progressing Goal: Diagnostic test results will improve Outcome: Progressing Goal: Respiratory complications will improve Outcome: Progressing   Problem: Activity: Goal: Risk for activity intolerance will decrease Outcome: Progressing   Problem: Nutrition: Goal: Adequate nutrition will be maintained Outcome: Progressing   Problem: Coping: Goal: Level of anxiety will decrease Outcome: Progressing   Problem: Elimination: Goal: Will not experience complications related to bowel motility Outcome: Progressing Goal: Will not experience complications related to urinary retention Outcome: Progressing   Problem: Safety: Goal: Ability to remain free from injury will improve Outcome: Progressing   Problem: Skin Integrity: Goal: Risk for impaired skin integrity will decrease Outcome: Progressing

## 2020-01-28 NOTE — Progress Notes (Signed)
  Speech Language Pathology Treatment: Dysphagia  Patient Details Name: Sydney Schultz MRN: [id number] DOB: 1932/03/15 Today's Date: 01/28/2020 Time: 1530-1540 SLP Time Calculation (min) (ACUTE ONLY): 10 min  Assessment / Plan / Recommendation Clinical Impression  Pt was seen for dysphagia treatment. Marylene Land, RN indicated that the pt has been tolerating the current diet well. No s/sx of aspiration were noted with thin liquids via straw or with puree solids. Labial strength continues to be reduced to the left which made labial stripping from spoon and labial seal on straw more difficult. However, no oral holding was noted. Trials of more advanced solids were offered but pt reported that she was full and therefore requested that they be deferred. SLP will continue to follow pt.    HPI HPI: Pt is an 86 yo female presenting with AMS and L facial droop. MRI showed R lower pontine, R PCA, and L thalamic infarcts. PMH includes: DM, TIAs, GERD, HTN, OSA, wheezing, OA      SLP Plan  Continue with current plan of care       Recommendations  Diet recommendations: Dysphagia 1 (puree);Thin liquid Liquids provided via: Cup;Straw Medication Administration: Whole meds with puree Supervision: Staff to assist with self feeding;Full supervision/cueing for compensatory strategies Compensations: Slow rate;Small sips/bites;Minimize environmental distractions;Monitor for anterior loss Postural Changes and/or Swallow Maneuvers: Seated upright 90 degrees;Upright 30-60 min after meal                Oral Care Recommendations: Oral care BID Follow up Recommendations: Skilled Nursing facility SLP Visit Diagnosis: Dysphagia, oropharyngeal phase (R13.12) Plan: Continue with current plan of care       Kayanna Mckillop I. Vear Clock, MS, CCC-SLP Acute Rehabilitation Services Office number [phone] Pager [phone]                Scheryl Marten 01/28/2020, 3:53 PM

## 2020-01-28 NOTE — Consult Note (Signed)
WOC Nurse Consult Note: Reason for Consult:DTPI/HAPI noted on skin inspection 01/27/20. Partial thickness area of skin loss at lower left buttock. Bilateral boggy heels. Wound type:Pressure Pressure Injury POA: No Measurement: Sacral area with erythema, but blanches. Central area of sacrum with DTPI, purple/maroon discoloration measuring 3.2cm x 2.6cm Bilateral boggy heels.  Prevalon Boots in place. No breaks in skin, no discoloration, skin blanches. Partial thickness skin loss at lower left buttock measuring 1.2cm x 1cm x 0.1cm with bright pink wound bed, scant serous exudate. Wound bed:As noted above Drainage (amount, consistency, odor) As noted above Periwound:intact, dry, poor turgor. Dressing procedure/placement/frequency: Patient with numerous comorbid conditions including DM with renal complications and CVA with dysphagia. Urinary incontinence managed by external urinary incontinence device (PurWick). TUrning and repositioning is in place. Due to tube feeding, HOB is elevated to 30-35 degrees.  I will provide a mattress replacement today with low air loss feature, she has bilateral Prevalon Boots, but I will add the twice daily painting of her heels with a betadine swabstick, the sacral foam is in place, but I will add a xeroform gauze as an antimicrobial astringent and nonadherent wound contact layer.   WOC nursing team will follow while in house, seeing every 7-10 days and  will remain available to this patient, the nursing and medical teams.  Please re-consult if needed in between visits. Thanks, Ladona Mow, MSN, RN, GNP, [facility] [place]  Pager# [phone]

## 2020-01-29 LAB — CBC
HCT: 31.5 % — ABNORMAL LOW (ref 36.0–46.0)
Hemoglobin: 9.8 g/dL — ABNORMAL LOW (ref 12.0–15.0)
MCH: 29.9 pg (ref 26.0–34.0)
MCHC: 31.1 g/dL (ref 30.0–36.0)
MCV: 96 fL (ref 80.0–100.0)
Platelets: 298 10*3/uL (ref 150–400)
RBC: 3.28 MIL/uL — ABNORMAL LOW (ref 3.87–5.11)
RDW: 13.7 % (ref 11.5–15.5)
WBC: 4 10*3/uL (ref 4.0–10.5)
nRBC: 0 % (ref 0.0–0.2)

## 2020-01-29 LAB — BASIC METABOLIC PANEL
Anion gap: 7 (ref 5–15)
BUN: 20 mg/dL (ref 8–23)
CO2: 27 mmol/L (ref 22–32)
Calcium: 9.6 mg/dL (ref 8.9–10.3)
Chloride: 107 mmol/L (ref 98–111)
Creatinine, Ser: 0.72 mg/dL (ref 0.44–1.00)
GFR calc Af Amer: >60 mL/min (ref >60)
GFR calc non Af Amer: >60 mL/min (ref >60)
Glucose, Bld: 127 mg/dL — ABNORMAL HIGH (ref 70–99)
Potassium: 4.5 mmol/L (ref 3.5–5.1)
Sodium: 141 mmol/L (ref 135–145)

## 2020-01-29 LAB — GLUCOSE, CAPILLARY
Glucose-Capillary: 113 mg/dL — ABNORMAL HIGH (ref 70–99)
Glucose-Capillary: 115 mg/dL — ABNORMAL HIGH (ref 70–99)
Glucose-Capillary: 116 mg/dL — ABNORMAL HIGH (ref 70–99)
Glucose-Capillary: 119 mg/dL — ABNORMAL HIGH (ref 70–99)
Glucose-Capillary: 133 mg/dL — ABNORMAL HIGH (ref 70–99)
Glucose-Capillary: 135 mg/dL — ABNORMAL HIGH (ref 70–99)
Glucose-Capillary: 137 mg/dL — ABNORMAL HIGH (ref 70–99)

## 2020-01-29 NOTE — Progress Notes (Signed)
PROGRESS NOTE    Sydney Schultz  PIR:[id number] DOB: [birth year] DOA: 01/16/2020 PCP: Jaclyn Shaggy, MD    Brief Narrative:  86 y.o.femalewith medical history significant ofDM, HTN, OSA and OA. She has been followed by neurology at [facility] with last OV 02/17/19 for f/u of evaluation for change in consciousness episode. Evaluation was non-focal. MRI at that time revealed cerebral atrophy and 2 old lacuanr infarcts right cerebral hemisphere. Patient has had progressive decline and prior to the precipitating event required assistance with dressing, toileting and mobility. On 01/15/20 she had increased weakness and around 5 PM was noted to hve developed a left facial droop and slurred speech. EMS was called and [facility] was brought to [facility] for further evaluation.  Assessment & Plan:   Principal Problem:   CVA (cerebral vascular accident) (HCC) Active Problems:   DM (diabetes mellitus) (HCC)   HTN (hypertension)   GERD (gastroesophageal reflux disease)   OSA (obstructive sleep apnea)   Malnutrition of moderate degree   Acute CVA (cerebral vascular accident) - possibly embolic MRI with acute R PCA territory infarct predominantly involving the temporal and occipital cortex.  Small acute infarct of L thalamus.  Short segment occlusion of the R P2 segment with distal reconstitution demonstrated on earlier CTA.  Severe chronic microangiopathy. Per neurology the stroke is embolic pattern.  Source is unclear. Recommending 30 day cardiac event monitor to r/o atrial fibrillation.  Message was sent to cardiology to arrange.  Will need to remind them prior to discharge. LDL 131. Zocor 40 + zetia 10 - A1c 5.6 - Continue DAPT x 3 months and then plavix alone  From a neurological standpoint patient remained stable.  Plavix is on hold for PEG tube placement on Tuesday.  Continue aspirin.    Dysphagia:  Cortrak placed 9/8. SLP continues to follow. Recommendation for PEG for prolonged  supplemental nutrition while undergoing ST.  Discussions were held with patient's daughters.  They were agreeable to proceed with PEG tube placement after initial hesitation.  Interventional radiology was consulted.  Plavix has to be held for 5 days.  Plan is for PEG tube placement on Tuesday. Patient was upgraded to dysphagia 1 diet however her oral intake remains poor.  Acute Metabolic Encephalopathy:  Patient with history of unresponsive spells per neurology notes.  Following with Dr. Malvin Johns and NP Morrie Sheldon at [place] [facility]. Lethargy on 9/9 may have been 2/2 UTI. Mental status has been stable.  She could be close to her baseline.  UTI with E. coli Completed course of ceftriaxone, pan-sensitive ecoli on UA.  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  Patient was on Jardiance at home.  Currently just on SSI.  CBGs are reasonably well controlled.  Recheck labs tomorrow.  Essential hypertension  Noted to be stable.  At home she was noted to be on irbesartan and HCTZ.  Blood pressure noted to be low normal.  Continue to hold her medications for now.  Dementia Continue Aricept  CKD3a Renal function is stable.  Labs reviewed this morning and noted to be stable.  Monitor urine output.    Cough CXR performed earlier without focal infiltrate.    Appears to have resolved.  Normocytic anemia Likely anemia of chronic disease.  No overt bleeding.  Hemoglobin is stable.  DVT prophylaxis: Lovenox subq Code Status: DNR Family Communication: Discussed with patient's daughter who was at the bedside. Disposition: Plan is for  patient to go to skilled nursing facility when medically stable.  PEG tube to be placed on Tuesday.  Status is: Inpatient  Remains inpatient appropriate because:Ongoing diagnostic testing needed not appropriate for outpatient work up and Unsafe d/c plan   Dispo: The patient  is from: Home              Anticipated d/c is to: SNF              Anticipated d/c date is: 3 days              Patient currently is not medically stable to d/c.    Consultants:   Neurology  Interventional radiology  Procedures:     Antimicrobials: Anti-infectives (From admission, onward)   Start     Dose/Rate Route Frequency Ordered Stop   02/01/20 0000  ceFAZolin (ANCEF) IVPB 2g/100 mL premix        2 g 200 mL/hr over 30 Minutes Intravenous  Once 01/27/20 1531     01/20/20 1930  cefTRIAXone (ROCEPHIN) 1 g in sodium chloride 0.9 % 100 mL IVPB        1 g 200 mL/hr over 30 Minutes Intravenous Every 24 hours 01/20/20 1901 01/25/20 1959      Subjective: Patient remains pleasantly confused.  No new issues per nursing staff.  Daughter at bedside.  Objective: Vitals:   01/28/20 2357 01/29/20 0405 01/29/20 0500 01/29/20 0742  BP: (!) 115/54 131/68  (!) 107/54  Pulse: 87 94  83  Resp: 18 18  18   Temp: 99.2 F (37.3 C) 100 F (37.8 C)  99.7 F (37.6 C)  TempSrc: Oral Oral  Oral  SpO2: 98% 99%  100%  Weight:   79.1 kg   Height:        Intake/Output Summary (Last 24 hours) at 01/29/2020 0932 Last data filed at 01/29/2020 0811 Gross per 24 hour  Intake 70 ml  Output 300 ml  Net -230 ml   Filed Weights   01/24/20 0407 01/26/20 0500 01/29/20 0500  Weight: 57.6 kg 54.4 kg 79.1 kg    Examination:  General appearance: Awake alert.  In no distress.  Remains distracted Resp: Clear to auscultation bilaterally.  Normal effort Cardio: S1-S2 is normal regular.  No S3-S4.  No rubs murmurs or bruit GI: Abdomen is soft.  Nontender nondistended.  Bowel sounds are present normal.  No masses organomegaly     Data Reviewed: I have personally reviewed following labs and imaging studies  CBC: Recent Labs  Lab 01/24/20 0322 01/25/20 0358 01/26/20 0207 01/29/20 0136  WBC 2.8* 2.7* 3.4* 4.0  HGB 9.9* 10.1* 10.9* 9.8*  HCT 31.5* 32.2* 34.8* 31.5*  MCV 95.5 96.1 95.9 96.0   PLT 240 245 277 298   Basic Metabolic Panel: Recent Labs  Lab 01/23/20 0118 01/24/20 0322 01/25/20 0358 01/26/20 0207 01/29/20 0136  NA  --  142 141 140 141  K  --  4.4 4.2 4.4 4.5  CL  --  109 108 108 107  CO2  --  25 26 26 27   GLUCOSE  --  146* 131* 121* 127*  BUN  --  15 17 18 20   CREATININE 0.88 0.78 0.76 0.80 0.72  CALCIUM  --  9.4 9.6 9.7 9.6   GFR: Estimated Creatinine Clearance: 50.1 mL/min (by C-G formula based on SCr of 0.72 mg/dL). Liver Function Tests: Recent Labs  Lab 01/24/20 0322 01/25/20 0358 01/26/20 0207  AST 24 28 37  ALT 19  20 24  ALKPHOS 71 70 78  BILITOT 0.3 0.4 0.4  PROT 4.8* 4.9* 4.9*  ALBUMIN 2.4* 2.4* 2.5*   CBG: Recent Labs  Lab 01/28/20 1723 01/28/20 2018 01/29/20 0001 01/29/20 0403 01/29/20 0739  GLUCAP 145* 110* 113* 137* 135*     Recent Results (from the past 240 hour(s))  Culture, Urine     Status: Abnormal   Collection Time: 01/20/20  6:00 PM   Specimen: Urine, Random  Result Value Ref Range Status   Specimen Description URINE, RANDOM  Final   Special Requests   Final    NONE Performed at [facility], [address], [place], [place] [address]    Culture >=100,000 COLONIES/mL ESCHERICHIA COLI (A)  Final   Report Status 01/23/2020 FINAL  Final   Organism ID, Bacteria ESCHERICHIA COLI (A)  Final      Susceptibility   Escherichia coli - MIC*    AMPICILLIN <=2 SENSITIVE Sensitive     CEFAZOLIN <=4 SENSITIVE Sensitive     CEFTRIAXONE <=0.25 SENSITIVE Sensitive     CIPROFLOXACIN <=0.25 SENSITIVE Sensitive     GENTAMICIN <=1 SENSITIVE Sensitive     IMIPENEM <=0.25 SENSITIVE Sensitive     NITROFURANTOIN <=16 SENSITIVE Sensitive     TRIMETH/SULFA <=20 SENSITIVE Sensitive     AMPICILLIN/SULBACTAM <=2 SENSITIVE Sensitive     PIP/TAZO <=4 SENSITIVE Sensitive     * >=100,000 COLONIES/mL ESCHERICHIA COLI     Radiology Studies: No results found.  Scheduled Meds: .  stroke: mapping our early stages of recovery  book   Does not apply Once  . acetaminophen (TYLENOL) oral liquid 160 mg/5 mL  650 mg Per Tube BID  . artificial tears   Left Eye QHS  . aspirin  325 mg Per Tube Daily  . [START ON 02/02/2020] clopidogrel  75 mg Per Tube QPM  . colestipol  2 g Per Tube QPM  . enoxaparin (LOVENOX) injection  40 mg Subcutaneous Q24H  . simvastatin  40 mg Per Tube q1800   And  . ezetimibe  10 mg Per Tube q1800  . free water  100 mL Per Tube Q6H  . Gerhardt's butt cream   Topical TID  . insulin aspart  0-9 Units Subcutaneous Q4H  . oxybutynin  5 mg Per Tube BID  . pantoprazole sodium  40 mg Per Tube Daily  . polyvinyl alcohol  1 drop Left Eye QID   Continuous Infusions: . [START ON 02/01/2020]  ceFAZolin (ANCEF) IV    . feeding supplement (OSMOLITE 1.2 CAL) 1,000 mL (01/28/20 1547)     LOS: 13 days   Osvaldo Shipper, MD [facility] Pager On Amion  If 7PM-7AM, please contact night-coverage 01/29/2020, 9:32 AM

## 2020-01-30 LAB — GLUCOSE, CAPILLARY
Glucose-Capillary: 102 mg/dL — ABNORMAL HIGH (ref 70–99)
Glucose-Capillary: 110 mg/dL — ABNORMAL HIGH (ref 70–99)
Glucose-Capillary: 124 mg/dL — ABNORMAL HIGH (ref 70–99)
Glucose-Capillary: 127 mg/dL — ABNORMAL HIGH (ref 70–99)
Glucose-Capillary: 128 mg/dL — ABNORMAL HIGH (ref 70–99)
Glucose-Capillary: 156 mg/dL — ABNORMAL HIGH (ref 70–99)

## 2020-01-30 NOTE — Progress Notes (Signed)
PROGRESS NOTE    Sydney Schultz  JQZ:[id number] DOB: January 12, 1932 DOA: 01/16/2020 PCP: Jaclyn Shaggy, MD    Brief Narrative:  86 y.o.femalewith medical history significant ofDM, HTN, OSA and OA. She has been followed by neurology at [facility] with last OV 02/17/19 for f/u of evaluation for change in consciousness episode. Evaluation was non-focal. MRI at that time revealed cerebral atrophy and 2 old lacuanr infarcts right cerebral hemisphere. Patient has had progressive decline and prior to the precipitating event required assistance with dressing, toileting and mobility. On 01/15/20 she had increased weakness and around 5 PM was noted to hve developed a left facial droop and slurred speech. EMS was called and patient was brought to [facility] for further evaluation.  Assessment & Plan:   Principal Problem:   CVA (cerebral vascular accident) (HCC) Active Problems:   DM (diabetes mellitus) (HCC)   HTN (hypertension)   GERD (gastroesophageal reflux disease)   OSA (obstructive sleep apnea)   Malnutrition of moderate degree   Acute CVA (cerebral vascular accident) - possibly embolic MRI with acute R PCA territory infarct predominantly involving the temporal and occipital cortex.  Small acute infarct of L thalamus.  Short segment occlusion of the R P2 segment with distal reconstitution demonstrated on earlier CTA.  Severe chronic microangiopathy. Per neurology the stroke is embolic pattern.  Source is unclear. Recommending 30 day cardiac event monitor to r/o atrial fibrillation.  Message was sent to cardiology to arrange.  Will need to remind them prior to discharge. LDL 131. Zocor 40 + zetia 10 - A1c 5.6 Continue DAPT x 3 months and then plavix alone  Patient seems to be stable from a neurological standpoint.  Plavix is on hold for PEG tube placement on Tuesday.  This was discussed with neurology.  Continue aspirin.    Dysphagia:  Cortrak placed 9/8. SLP continues to follow.  Recommendation for PEG for prolonged supplemental nutrition while undergoing ST.  Discussions were held with patient's daughters.  They were agreeable to proceed with PEG tube placement after initial hesitation.  Interventional radiology was consulted.  Plavix has to be held for 5 days.  Plan is for PEG tube placement on Tuesday. Patient was upgraded to dysphagia 1 diet however her oral intake remains poor.  Acute Metabolic Encephalopathy:  Patient with history of unresponsive spells per neurology notes.  Following with Dr. Malvin Johns and NP Morrie Sheldon at [facility]. Lethargy on 9/9 may have been 2/2 UTI. Mental status has been stable.  She could be close to her baseline.  UTI with E. coli Completed course of ceftriaxone, pan-sensitive ecoli on UA.  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  Patient was on Jardiance at home.  Currently on SSI.  CBGs are reasonably well controlled.   Essential hypertension  Noted to be stable.  At home she was noted to be on irbesartan and HCTZ.  Blood pressure noted to be low normal.  Continue to hold her medications for now.  Dementia Continue Aricept  CKD3a Renal function is stable.  Monitor urine output.    Cough CXR performed earlier without focal infiltrate.    Appears to have resolved.  Normocytic anemia Likely anemia of chronic disease.  No overt bleeding.  Hemoglobin is stable.  DVT prophylaxis: Lovenox subq Code Status: DNR Family Communication: Discussed with patient's daughter who was at the bedside. Disposition: Plan is for patient to go to skilled nursing  facility when medically stable.  PEG tube to be placed on Tuesday.  Status is: Inpatient  Remains inpatient appropriate because:Ongoing diagnostic testing needed not appropriate for outpatient work up and Unsafe d/c plan   Dispo: The patient is from: Home              Anticipated d/c  is to: SNF              Anticipated d/c date is: 3 days              Patient currently is not medically stable to d/c.    Consultants:   Neurology  Interventional radiology  Procedures:     Antimicrobials: Anti-infectives (From admission, onward)   Start     Dose/Rate Route Frequency Ordered Stop   02/01/20 0000  ceFAZolin (ANCEF) IVPB 2g/100 mL premix        2 g 200 mL/hr over 30 Minutes Intravenous  Once 01/27/20 1531     01/20/20 1930  cefTRIAXone (ROCEPHIN) 1 g in sodium chloride 0.9 % 100 mL IVPB        1 g 200 mL/hr over 30 Minutes Intravenous Every 24 hours 01/20/20 1901 01/25/20 1959      Subjective: Patient remains pleasantly confused.  No new issues noted.  Denies any pain.  Objective: Vitals:   01/29/20 2335 01/30/20 0406 01/30/20 0450 01/30/20 0759  BP: (!) 116/59 136/62  (!) 124/57  Pulse: 87 83  83  Resp: 18 19  19   Temp: 99 F (37.2 C) 98.6 F (37 C)  98.6 F (37 C)  TempSrc: Oral Oral  Axillary  SpO2: 99% 100%  99%  Weight:   80.5 kg   Height:        Intake/Output Summary (Last 24 hours) at 01/30/2020 1018 Last data filed at 01/29/2020 2121 Gross per 24 hour  Intake 60 ml  Output 400 ml  Net -340 ml   Filed Weights   01/26/20 0500 01/29/20 0500 01/30/20 0450  Weight: 54.4 kg 79.1 kg 80.5 kg    Examination:  Awake alert.  In no distress.  Mildly distracted Lungs are clear to auscultation bilaterally.  Normal effort S1-S2 is normal regular.  No S3-S4.  No rubs or bruit Abdomen is soft.  Nontender nondistended No significant changes in her neurological examination.     Data Reviewed: I have personally reviewed following labs and imaging studies  CBC: Recent Labs  Lab 01/24/20 0322 01/25/20 0358 01/26/20 0207 01/29/20 0136  WBC 2.8* 2.7* 3.4* 4.0  HGB 9.9* 10.1* 10.9* 9.8*  HCT 31.5* 32.2* 34.8* 31.5*  MCV 95.5 96.1 95.9 96.0  PLT 240 245 277 298   Basic Metabolic Panel: Recent Labs  Lab 01/24/20 0322 01/25/20 0358  01/26/20 0207 01/29/20 0136  NA 142 141 140 141  K 4.4 4.2 4.4 4.5  CL 109 108 108 107  CO2 25 26 26 27   GLUCOSE 146* 131* 121* 127*  BUN 15 17 18 20   CREATININE 0.78 0.76 0.80 0.72  CALCIUM 9.4 9.6 9.7 9.6   GFR: Estimated Creatinine Clearance: 50.6 mL/min (by C-G formula based on SCr of 0.72 mg/dL). Liver Function Tests: Recent Labs  Lab 01/24/20 0322 01/25/20 0358 01/26/20 0207  AST 24 28 37  ALT 19 20 24   ALKPHOS 71 70 78  BILITOT 0.3 0.4 0.4  PROT 4.8* 4.9* 4.9*  ALBUMIN 2.4* 2.4* 2.5*   CBG: Recent Labs  Lab 01/29/20 1639 01/29/20 2023 01/29/20 2332 01/30/20 0403 01/30/20 01/31/20  GLUCAP 116* 133* 115* 124* 156*     Recent Results (from the past 240 hour(s))  Culture, Urine     Status: Abnormal   Collection Time: 01/20/20  6:00 PM   Specimen: Urine, Random  Result Value Ref Range Status   Specimen Description URINE, RANDOM  Final   Special Requests   Final    NONE Performed at [facility], [address] [place], [place] [address]    Culture >=100,000 COLONIES/mL ESCHERICHIA COLI (A)  Final   Report Status 01/23/2020 FINAL  Final   Organism ID, Bacteria ESCHERICHIA COLI (A)  Final      Susceptibility   Escherichia coli - MIC*    AMPICILLIN <=2 SENSITIVE Sensitive     CEFAZOLIN <=4 SENSITIVE Sensitive     CEFTRIAXONE <=0.25 SENSITIVE Sensitive     CIPROFLOXACIN <=0.25 SENSITIVE Sensitive     GENTAMICIN <=1 SENSITIVE Sensitive     IMIPENEM <=0.25 SENSITIVE Sensitive     NITROFURANTOIN <=16 SENSITIVE Sensitive     TRIMETH/SULFA <=20 SENSITIVE Sensitive     AMPICILLIN/SULBACTAM <=2 SENSITIVE Sensitive     PIP/TAZO <=4 SENSITIVE Sensitive     * >=100,000 COLONIES/mL ESCHERICHIA COLI     Radiology Studies: No results found.  Scheduled Meds:   stroke: mapping our early stages of recovery book   Does not apply Once   acetaminophen (TYLENOL) oral liquid 160 mg/5 mL  650 mg Per Tube BID   artificial tears   Left Eye QHS   aspirin  325 mg  Per Tube Daily   [START ON 02/02/2020] clopidogrel  75 mg Per Tube QPM   colestipol  2 g Per Tube QPM   enoxaparin (LOVENOX) injection  40 mg Subcutaneous Q24H   simvastatin  40 mg Per Tube q1800   And   ezetimibe  10 mg Per Tube q1800   free water  100 mL Per Tube Q6H   Gerhardt's butt cream   Topical TID   insulin aspart  0-9 Units Subcutaneous Q4H   oxybutynin  5 mg Per Tube BID   pantoprazole sodium  40 mg Per Tube Daily   polyvinyl alcohol  1 drop Left Eye QID   Continuous Infusions:  [START ON 02/01/2020]  ceFAZolin (ANCEF) IV     feeding supplement (OSMOLITE 1.2 CAL) 1,000 mL (01/30/20 0655)     LOS: 14 days   Osvaldo Shipper, MD [facility] Pager On Amion  If 7PM-7AM, please contact night-coverage 01/30/2020, 10:18 AM

## 2020-01-31 LAB — GLUCOSE, CAPILLARY
Glucose-Capillary: 114 mg/dL — ABNORMAL HIGH (ref 70–99)
Glucose-Capillary: 125 mg/dL — ABNORMAL HIGH (ref 70–99)
Glucose-Capillary: 126 mg/dL — ABNORMAL HIGH (ref 70–99)
Glucose-Capillary: 136 mg/dL — ABNORMAL HIGH (ref 70–99)
Glucose-Capillary: 154 mg/dL — ABNORMAL HIGH (ref 70–99)
Glucose-Capillary: 99 mg/dL (ref 70–99)

## 2020-01-31 NOTE — Progress Notes (Signed)
PROGRESS NOTE    Sydney Schultz  SHF:[id number] DOB: 1932-04-01 DOA: 01/16/2020 PCP: Jaclyn Shaggy, MD    Brief Narrative:  86 y.o.femalewith medical history significant ofDM, HTN, OSA and OA. She has been followed by neurology at [facility] with last OV 02/17/19 for f/u of evaluation for change in consciousness episode. Evaluation was non-focal. MRI at that time revealed cerebral atrophy and 2 old lacuanr infarcts right cerebral hemisphere. Patient has had progressive decline and prior to the precipitating event required assistance with dressing, toileting and mobility. On 01/15/20 she had increased weakness and around 5 PM was noted to hve developed a left facial droop and slurred speech. EMS was called and patient was brought to [facility] for further evaluation.   Assessment & Plan:   Principal Problem:   CVA (cerebral vascular accident) (HCC) Active Problems:   DM (diabetes mellitus) (HCC)   HTN (hypertension)   GERD (gastroesophageal reflux disease)   OSA (obstructive sleep apnea)   Malnutrition of moderate degree   Acute CVA (cerebral vascular accident) - possibly embolic MRI with acute R PCA territory infarct predominantly involving the temporal and occipital cortex.  Small acute infarct of L thalamus.  Short segment occlusion of the R P2 segment with distal reconstitution demonstrated on earlier CTA.  Severe chronic microangiopathy. Per neurology the stroke is embolic pattern.  Source is unclear. Recommending 30 day cardiac event monitor to r/o atrial fibrillation.  Message was sent to cardiology to arrange.  Will need to remind them prior to discharge. LDL 131. Zocor 40 + zetia 10 - A1c 5.6 Continue DAPT x 3 months and then plavix alone  Plavix is on hold for PEG tube placement on Tuesday.  This was discussed with neurology.  Continue aspirin.   Patient seems to be stable from a neurological standpoint.  Dysphagia Cortrak placed 9/8. SLP continues to follow.  Recommendation for PEG for prolonged supplemental nutrition while undergoing ST.  Discussions were held with patient's daughters.  They were agreeable to proceed with PEG tube placement after initial hesitation.  Interventional radiology was consulted.  Plavix has to be held for 5 days.  Plan is for PEG tube placement on Tuesday, 9/21. Patient was upgraded to dysphagia 1 diet however her oral intake remains poor.  Acute Metabolic Encephalopathy:  Patient with history of unresponsive spells per neurology notes.  Following with Dr. Malvin Johns and NP Morrie Sheldon at [place] [facility]. Lethargy on 9/9 may have been 2/2 UTI. Mental status has been stable.  She could be close to her baseline.  UTI with E. coli Completed course of ceftriaxone, pan-sensitive ecoli on UA.  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  Patient was on Jardiance at home.  Currently on SSI.  CBGs are reasonably well controlled.   Essential hypertension  Noted to be stable.  At home she was noted to be on irbesartan and HCTZ.  Blood pressure noted to be low normal.  Continue to hold her medications for now.  Dementia Continue Aricept  CKD3a Renal function is stable.  Monitor urine output.    Cough CXR performed earlier without focal infiltrate.    Appears to have resolved.  Normocytic anemia Likely anemia of chronic disease.  No overt bleeding.  Hemoglobin is stable.  DVT prophylaxis: Lovenox subq Code Status: DNR Family Communication: Discussed with patient's daughter who was at the bedside. Disposition: Plan is for patient to go to skilled nursing facility  when medically stable.  PEG tube to be placed on Tuesday.  Status is: Inpatient  Remains inpatient appropriate because:Ongoing diagnostic testing needed not appropriate for outpatient work up and Unsafe d/c plan   Dispo: The patient is from: Home               Anticipated d/c is to: SNF              Anticipated d/c date is: 3 days              Patient currently is not medically stable to d/c.    Consultants:   Neurology  Interventional radiology  Procedures:     Antimicrobials: Anti-infectives (From admission, onward)   Start     Dose/Rate Route Frequency Ordered Stop   02/01/20 0000  ceFAZolin (ANCEF) IVPB 2g/100 mL premix        2 g 200 mL/hr over 30 Minutes Intravenous  Once 01/27/20 1531     01/20/20 1930  cefTRIAXone (ROCEPHIN) 1 g in sodium chloride 0.9 % 100 mL IVPB        1 g 200 mL/hr over 30 Minutes Intravenous Every 24 hours 01/20/20 1901 01/25/20 1959      Subjective: Patient remains pleasantly confused.  No complaints offered.  Daughter at bedside.  Objective: Vitals:   01/30/20 2341 01/31/20 0353 01/31/20 0404 01/31/20 0726  BP: (!) 111/50 (!) 125/56  108/63  Pulse: 87 86  86  Resp: 20 20  18   Temp: 98.7 F (37.1 C) 99.1 F (37.3 C)  98.5 F (36.9 C)  TempSrc: Oral Oral  Oral  SpO2: 97% 98%  99%  Weight:   80 kg   Height:        Intake/Output Summary (Last 24 hours) at 01/31/2020 0957 Last data filed at 01/31/2020 0856 Gross per 24 hour  Intake 7697.33 ml  Output 325 ml  Net 7372.33 ml   Filed Weights   01/29/20 0500 01/30/20 0450 01/31/20 0404  Weight: 79.1 kg 80.5 kg 80 kg    Examination:  General appearance: Awake alert.  In no distress.  Distracted. Resp: Clear to auscultation bilaterally.  Normal effort Cardio: S1-S2 is normal regular.  No S3-S4.  No rubs murmurs or bruit GI: Abdomen is soft.  Nontender nondistended.  Bowel sounds are present normal.  No masses organomegaly No significant changes in neurological examination.     Data Reviewed: I have personally reviewed following labs and imaging studies  CBC: Recent Labs  Lab 01/25/20 0358 01/26/20 0207 01/29/20 0136  WBC 2.7* 3.4* 4.0  HGB 10.1* 10.9* 9.8*  HCT 32.2* 34.8* 31.5*  MCV 96.1 95.9 96.0  PLT 245 277 298    Basic Metabolic Panel: Recent Labs  Lab 01/25/20 0358 01/26/20 0207 01/29/20 0136  NA 141 140 141  K 4.2 4.4 4.5  CL 108 108 107  CO2 26 26 27   GLUCOSE 131* 121* 127*  BUN 17 18 20   CREATININE 0.76 0.80 0.72  CALCIUM 9.6 9.7 9.6   GFR: Estimated Creatinine Clearance: 50.4 mL/min (by C-G formula based on SCr of 0.72 mg/dL). Liver Function Tests: Recent Labs  Lab 01/25/20 0358 01/26/20 0207  AST 28 37  ALT 20 24  ALKPHOS 70 78  BILITOT 0.4 0.4  PROT 4.9* 4.9*  ALBUMIN 2.4* 2.5*   CBG: Recent Labs  Lab 01/30/20 1613 01/30/20 1946 01/30/20 2340 01/31/20 0355 01/31/20 0805  GLUCAP 110* 128* 127* 136* 154*     No results found for  this or any previous visit (from the past 240 hour(s)).   Radiology Studies: No results found.  Scheduled Meds: .  stroke: mapping our early stages of recovery book   Does not apply Once  . acetaminophen (TYLENOL) oral liquid 160 mg/5 mL  650 mg Per Tube BID  . artificial tears   Left Eye QHS  . aspirin  325 mg Per Tube Daily  . [START ON 02/02/2020] clopidogrel  75 mg Per Tube QPM  . colestipol  2 g Per Tube QPM  . enoxaparin (LOVENOX) injection  40 mg Subcutaneous Q24H  . simvastatin  40 mg Per Tube q1800   And  . ezetimibe  10 mg Per Tube q1800  . free water  100 mL Per Tube Q6H  . Gerhardt's butt cream   Topical TID  . insulin aspart  0-9 Units Subcutaneous Q4H  . oxybutynin  5 mg Per Tube BID  . pantoprazole sodium  40 mg Per Tube Daily  . polyvinyl alcohol  1 drop Left Eye QID   Continuous Infusions: . [START ON 02/01/2020]  ceFAZolin (ANCEF) IV    . feeding supplement (OSMOLITE 1.2 CAL) 1,000 mL (01/31/20 0358)     LOS: 15 days   Osvaldo Shipper, MD [facility] Pager On Amion  If 7PM-7AM, please contact night-coverage 01/31/2020, 9:57 AM

## 2020-02-01 LAB — BASIC METABOLIC PANEL
Anion gap: 9 (ref 5–15)
BUN: 20 mg/dL (ref 8–23)
CO2: 26 mmol/L (ref 22–32)
Calcium: 9.7 mg/dL (ref 8.9–10.3)
Chloride: 107 mmol/L (ref 98–111)
Creatinine, Ser: 0.73 mg/dL (ref 0.44–1.00)
GFR calc Af Amer: >60 mL/min (ref >60)
GFR calc non Af Amer: >60 mL/min (ref >60)
Glucose, Bld: 120 mg/dL — ABNORMAL HIGH (ref 70–99)
Potassium: 4.2 mmol/L (ref 3.5–5.1)
Sodium: 142 mmol/L (ref 135–145)

## 2020-02-01 LAB — CBC
HCT: 31.6 % — ABNORMAL LOW (ref 36.0–46.0)
Hemoglobin: 9.9 g/dL — ABNORMAL LOW (ref 12.0–15.0)
MCH: 29.8 pg (ref 26.0–34.0)
MCHC: 31.3 g/dL (ref 30.0–36.0)
MCV: 95.2 fL (ref 80.0–100.0)
Platelets: 268 10*3/uL (ref 150–400)
RBC: 3.32 MIL/uL — ABNORMAL LOW (ref 3.87–5.11)
RDW: 14.3 % (ref 11.5–15.5)
WBC: 4.8 10*3/uL (ref 4.0–10.5)
nRBC: 0 % (ref 0.0–0.2)

## 2020-02-01 LAB — GLUCOSE, CAPILLARY
Glucose-Capillary: 100 mg/dL — ABNORMAL HIGH (ref 70–99)
Glucose-Capillary: 99 mg/dL (ref 70–99)
Glucose-Capillary: 112 mg/dL — ABNORMAL HIGH (ref 70–99)
Glucose-Capillary: 103 mg/dL — ABNORMAL HIGH (ref 70–99)
Glucose-Capillary: 131 mg/dL — ABNORMAL HIGH (ref 70–99)

## 2020-02-01 LAB — PROTIME-INR
INR: 1 (ref 0.8–1.2)
Prothrombin Time: 13.2 seconds (ref 11.4–15.2)

## 2020-02-01 MED ORDER — COLLAGENASE 250 UNIT/GM EX OINT
TOPICAL_OINTMENT | Freq: Every day | CUTANEOUS | Status: DC
  Filled 2020-02-01: qty 30

## 2020-02-01 NOTE — Progress Notes (Signed)
Nutrition Follow-up  DOCUMENTATION CODES:   Non-severe (moderate) malnutrition in context of chronic illness  INTERVENTION:  Once PEG is placed and ready for use, continue: -Osmolite 1.2 cal @ 108m/hr -1046mfree water flush Q6H  Tube feeding regimen provides1728kcal (100% of needs),80grams of protein, and 118124mf H2O. Total free water: 1581 ml/day   NUTRITION DIAGNOSIS:   Moderate Malnutrition related to chronic illness (TIA, CAD) as evidenced by energy intake < 75% for > or equal to 1 month, mild fat depletion, moderate fat depletion, mild muscle depletion, moderate muscle depletion.  Ongoing  GOAL:   Patient will meet greater than or equal to 90% of their needs  Met with TF  MONITOR:   Diet advancement, Labs, Weight trends, TF tolerance, Skin, I & O's  REASON FOR ASSESSMENT:   Consult Enteral/tube feeding initiation and management  ASSESSMENT:   Ms. DorJARIKA ROBBEN a 87 28o. female with history of  "episodes" which previously been attributed to diabetes, TIAs, "sleep attacks"DM, HTN, CAD (s/p stent) and OSA, presents with confusion and shaking. She did not receive IV t-PA due to late presentation (>4.5 hours from time of onset).  Pt admitted with stroke (rt acute PCA, rt lower pontine, lt thalamic infarcts).   9/6- s/p BSE- recommend NPO; s/p MBSS- recommend NPO 9/7- s/p BSE- recommend NPO 9/8 - Cortrak placed (gastric)   Pt remains pleasantly confused. Pt to have PEG placed today. Pt to d/c to SNF.   Current TF via Cortrak: Osmolite 1.2 cal @ 51m4m with 100ml6me water Q6H. This provides1728kcal,80grams of protein, and 1181ml 55mree water.  Labs: CBGs 99-100 Medications: Novolog, Protonix  Diet Order:   Diet Order            Diet NPO time specified  Diet effective midnight                 EDUCATION NEEDS:   Education needs have been addressed  Skin:  Skin Assessment: Reviewed RN Assessment  Last BM:  9/20  Height:   Ht  Readings from Last 1 Encounters:  01/16/20 _0  (1.575 m)    Weight:   Wt Readings from Last 1 Encounters:  01/31/20 80 kg    Ideal Body Weight:  50 kg  BMI:  Body mass index is 32.26 kg/m.  Estimated Nutritional Needs:   Kcal:  1600-1800  Protein:  80-95 grams  Fluid:  > 1.6 L    AmandaLarkin InaRD, LDN RD pager number and weekend/on-call pager number located in [email or web address]

## 2020-02-01 NOTE — Progress Notes (Signed)
PROGRESS NOTE    Sydney Schultz  TKZ:[id number] DOB: Nov 30, 1931 DOA: 01/16/2020 PCP: Jaclyn Shaggy, MD    Brief Narrative:  86 y.o.femalewith medical history significant ofDM, HTN, OSA and OA. She has been followed by neurology at [facility] with last OV 02/17/19 for f/u of evaluation for change in consciousness episode. Evaluation was non-focal. MRI at that time revealed cerebral atrophy and 2 old lacuanr infarcts right cerebral hemisphere. Patient has had progressive decline and prior to the precipitating event required assistance with dressing, toileting and mobility. On 01/15/20 she had increased weakness and around 5 PM was noted to hve developed a left facial droop and slurred speech. EMS was called and patient was brought to [facility] for further evaluation.   Assessment & Plan:   Principal Problem:   CVA (cerebral vascular accident) (HCC) Active Problems:   DM (diabetes mellitus) (HCC)   HTN (hypertension)   GERD (gastroesophageal reflux disease)   OSA (obstructive sleep apnea)   Malnutrition of moderate degree   Acute CVA (cerebral vascular accident) - possibly embolic MRI with acute R PCA territory infarct predominantly involving the temporal and occipital cortex.  Small acute infarct of L thalamus.  Short segment occlusion of the R P2 segment with distal reconstitution demonstrated on earlier CTA.  Severe chronic microangiopathy. Per neurology the stroke is embolic pattern.  Source is unclear. Recommending 30 day cardiac event monitor to r/o atrial fibrillation.  Message was sent to cardiology to arrange.  Will need to remind them prior to discharge. LDL 131. Zocor 40 + zetia 10 - A1c 5.6 Continue DAPT x 3 months and then plavix alone  Plavix is on hold for PEG tube placement.  This was discussed with neurology.  Continue aspirin.   Patient seems to be stable from a neurological standpoint.  Dysphagia Cortrak placed 9/8. SLP continues to follow. Recommendation for PEG  for prolonged supplemental nutrition while undergoing ST.  Discussions were held with patient's daughters.  They were agreeable to proceed with PEG tube placement after initial hesitation.  Interventional radiology was consulted.  Plavix has to be held for 5 days. Patient was upgraded to dysphagia 1 diet however her oral intake remains poor. Plan is for PEG tube placement today.  Labs noted to be stable.  Acute Metabolic Encephalopathy:  Patient with history of unresponsive spells per neurology notes.  Following with Dr. Malvin Johns and NP Morrie Sheldon at [place] [facility]. Lethargy on 9/9 may have been 2/2 UTI. Mental status stable.  UTI with E. coli Completed course of ceftriaxone, pan-sensitive ecoli on UA.  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  Patient was on Jardiance at home.  Currently on SSI.  CBGs are reasonably well controlled.   Essential hypertension  Noted to be stable.  At home she was noted to be on irbesartan and HCTZ.  For the past several days her blood pressure has been running low normal so her antihypertensives were held.  Last couple of readings noted to be high.  Continue to monitor for now.  If the blood pressure remains elevated then we can resume one of her antihypertensives.  Dementia Continue Aricept  CKD3a Renal function is stable.    Cough CXR performed earlier without focal infiltrate.    Appears to have resolved.  Normocytic anemia Likely anemia of chronic disease.  No overt bleeding.  Hemoglobin is stable.  DVT prophylaxis: Lovenox subq Code Status: DNR Family  Communication: Discussed with patient's daughter who was at the bedside. Disposition: Plan is for the patient to go to SNF.  PEG tube hopefully today.  Status is: Inpatient  Remains inpatient appropriate because:Ongoing diagnostic testing needed not appropriate for outpatient work up and Unsafe d/c  plan   Dispo: The patient is from: Home              Anticipated d/c is to: SNF              Anticipated d/c date is: 2 days              Patient currently is not medically stable to d/c.    Consultants:   Neurology  Interventional radiology  Procedures:     Antimicrobials: Anti-infectives (From admission, onward)   Start     Dose/Rate Route Frequency Ordered Stop   02/01/20 0000  ceFAZolin (ANCEF) IVPB 2g/100 mL premix        2 g 200 mL/hr over 30 Minutes Intravenous  Once 01/27/20 1531 02/01/20 0041   01/20/20 1930  cefTRIAXone (ROCEPHIN) 1 g in sodium chloride 0.9 % 100 mL IVPB        1 g 200 mL/hr over 30 Minutes Intravenous Every 24 hours 01/20/20 1901 01/25/20 1959      Subjective: Patient remains pleasantly confused.  No changes noted.  No complaints offered.  Objective: Vitals:   01/31/20 1934 01/31/20 2335 02/01/20 0342 02/01/20 0811  BP: (!) 123/55 117/63 (!) 154/71 (!) 168/85  Pulse: 87 87 84 81  Resp: 17 16 18 18   Temp: 99 F (37.2 C) 98.9 F (37.2 C) 98.1 F (36.7 C) 97.6 F (36.4 C)  TempSrc: Oral Oral Oral Oral  SpO2: 100% 100% 97% 99%  Weight:      Height:        Intake/Output Summary (Last 24 hours) at 02/01/2020 0916 Last data filed at 01/31/2020 2359 Gross per 24 hour  Intake 1321 ml  Output --  Net 1321 ml   Filed Weights   01/29/20 0500 01/30/20 0450 01/31/20 0404  Weight: 79.1 kg 80.5 kg 80 kg    Examination:  General appearance: Awake alert.  In no distress.  Remains distracted Resp: Clear to auscultation bilaterally.  Normal effort Cardio: S1-S2 is normal regular.  No S3-S4.  No rubs murmurs or bruit GI: Abdomen is soft.  Nontender nondistended.  Bowel sounds are present normal.  No masses organomegaly     Data Reviewed: I have personally reviewed following labs and imaging studies  CBC: Recent Labs  Lab 01/26/20 0207 01/29/20 0136 02/01/20 0400  WBC 3.4* 4.0 4.8  HGB 10.9* 9.8* 9.9*  HCT 34.8* 31.5* 31.6*  MCV  95.9 96.0 95.2  PLT 277 298 268   Basic Metabolic Panel: Recent Labs  Lab 01/26/20 0207 01/29/20 0136 02/01/20 0400  NA 140 141 142  K 4.4 4.5 4.2  CL 108 107 107  CO2 26 27 26   GLUCOSE 121* 127* 120*  BUN 18 20 20   CREATININE 0.80 0.72 0.73  CALCIUM 9.7 9.6 9.7   GFR: Estimated Creatinine Clearance: 50.4 mL/min (by C-G formula based on SCr of 0.73 mg/dL). Liver Function Tests: Recent Labs  Lab 01/26/20 0207  AST 37  ALT 24  ALKPHOS 78  BILITOT 0.4  PROT 4.9*  ALBUMIN 2.5*   CBG: Recent Labs  Lab 01/31/20 1604 01/31/20 1932 01/31/20 2334 02/01/20 0340 02/01/20 0809  GLUCAP 126* 114* 99 100* 99     No  results found for this or any previous visit (from the past 240 hour(s)).   Radiology Studies: No results found.  Scheduled Meds:   stroke: mapping our early stages of recovery book   Does not apply Once   acetaminophen (TYLENOL) oral liquid 160 mg/5 mL  650 mg Per Tube BID   artificial tears   Left Eye QHS   aspirin  325 mg Per Tube Daily   [START ON 02/02/2020] clopidogrel  75 mg Per Tube QPM   colestipol  2 g Per Tube QPM   enoxaparin (LOVENOX) injection  40 mg Subcutaneous Q24H   simvastatin  40 mg Per Tube q1800   And   ezetimibe  10 mg Per Tube q1800   free water  100 mL Per Tube Q6H   Gerhardt's butt cream   Topical TID   insulin aspart  0-9 Units Subcutaneous Q4H   oxybutynin  5 mg Per Tube BID   pantoprazole sodium  40 mg Per Tube Daily   polyvinyl alcohol  1 drop Left Eye QID   Continuous Infusions:  feeding supplement (OSMOLITE 1.2 CAL) 1,000 mL (01/31/20 0358)     LOS: 16 days   Osvaldo Shipper, MD [facility] Pager On Amion  If 7PM-7AM, please contact night-coverage 02/01/2020, 9:16 AM

## 2020-02-01 NOTE — Consult Note (Signed)
WOC Nurse follow-up Consult Note: Pt previously developed a deep tissue injury to the sacrum; refer to previous consult note on 9/17.  Wound has declined to unstageable; 1X.3cm, 100% tightly adhered eschar over sacrum/inner gluteal cleft.  Left buttock remains with darker-colored skin to deep tissue injury; 3X3cm. Pt is on a low airloss mattress to reduce pressure.  Plan: Topical treatment orders provided for bedside nurses to perform as follows to assist with removal of nonviable tissue: Apply Santyl to sacrum wound Q day, then cover with moist 2X2 and foam dressing.  (Change foam dressing Q 3 days or PRN soiling.) Daughter at the bedside to assess wound appearance and discuss plan of care. WOC team will continue to assess the location weekly to determine if a change in the plan of care is indicated at that time.  Cammie Mcgee MSN, RN, CWOCN, [place], CNS [phone]

## 2020-02-01 NOTE — Progress Notes (Signed)
IR unable to accommodate patient today for procedure. Will plan for 02/02/20 for gastrostomy tube placement. Please keep patient NPO after midnight.   Alwyn Ren, [place] [phone] 02/01/2020, 5:13 PM

## 2020-02-01 NOTE — Progress Notes (Signed)
Occupational Therapy Treatment Patient Details Name: Sydney Schultz MRN: [id number] DOB: Jun 14, 1931 Today's Date: 02/01/2020    History of present illness Pt is a 86 y/o female with PMH of DM, HTN, OA; presenting with AMS and progressive decline in ADLS, mobility then L facial droop/slurred speech 9/4. MRI reveals acute lower pontine, R PCA territory infarct, L thalamic infarcts- embolic pattern.    OT comments  Pt pleasant on arrival and becomes more agitated with movement. Pt requires encouragement of daughter present to engage in therapy this session with moments of outburst. Pt required mod- total +2 (A) for transfers. Pt total (A) eob sitting. Recommendation for palliative consult to help with care options for daughter. Daughter expressed SNF so that patient can get more therapy however pt very reluctant to participate. Recommendation for SNF at this time to decrease burden of care.    Follow Up Recommendations  SNF    Equipment Recommendations  Wheelchair (measurements OT);Wheelchair cushion (measurements OT);Other (comment);Hospital bed (hoyer / cushion for wound if in chair)    Recommendations for Other Services Other (comment) (Palliative care consult)    Precautions / Restrictions Precautions Precautions: Fall Precaution Comments: NG tube Restrictions Weight Bearing Restrictions: No       Mobility Bed Mobility Overal bed mobility: Needs Assistance Bed Mobility: Rolling;Supine to Sit;Sit to Supine Rolling: Total assist   Supine to sit: Total assist;+2 for physical assistance Sit to supine: Total assist;+2 for physical assistance   General bed mobility comments: total assist +2 for trunk and LE management, rolling bilaterally for proper pad placement.  Transfers Overall transfer level: Needs assistance Equipment used: Rolling walker (2 wheeled);2 person hand held assist Transfers: Sit to/from Stand Sit to Stand: Max assist;+2 physical assistance Stand pivot  transfers: Max assist;+2 physical assistance       General transfer comment: Max assist +2 for power up, steadying, and chest to upright. second stand attempt without RW and bilateral HHA, R lateral leaning.    Balance Overall balance assessment: Needs assistance Sitting-balance support: Feet supported;No upper extremity supported Sitting balance-Leahy Scale: Zero Sitting balance - Comments: Patient unable to maintin static standing without external assist Postural control: Right lateral lean;Left lateral lean;Posterior lean Standing balance support: During functional activity;No upper extremity supported;Bilateral upper extremity supported Standing balance-Leahy Scale: Poor Standing balance comment: required both AD and external stability assist                           ADL either performed or assessed with clinical judgement   ADL Overall ADL's : Needs assistance/impaired Eating/Feeding: NPO   Grooming: Maximal assistance;Sitting Grooming Details (indicate cue type and reason): pt just flat refuses to complete task but allows OT             Lower Body Dressing: Total assistance   Toilet Transfer: +2 for physical assistance;Maximal assistance           Functional mobility during ADLs: +2 for physical assistance;Maximal assistance       Vision       Perception     Praxis      Cognition Arousal/Alertness: Awake/alert Behavior During Therapy: Restless;Agitated Overall Cognitive Status: Impaired/Different from baseline Area of Impairment: Attention;Following commands;Safety/judgement;Problem solving                   Current Attention Level: Sustained Memory: Decreased short-term memory Following Commands: Follows one step commands inconsistently;Follows one step commands with increased time Safety/Judgement: Decreased awareness of  safety;Decreased awareness of deficits   Problem Solving: Requires verbal cues;Requires tactile cues;Slow  processing;Difficulty sequencing;Decreased initiation General Comments: Pt irritable with PT/OT, requiring max verbal encouragement to perform mobility tasks. Pt stating "now wait just a minute, that's enough" and phrases similiar, daughter offering encouragement for pt throughout session.        Exercises     Shoulder Instructions       General Comments woc for buttock wounds. pt with supportive daughter present tellign staff to continue with care. pt with redirection with daughter cueing "momma let them help you"    Pertinent Vitals/ Pain       Pain Assessment: Faces Faces Pain Scale: Hurts even more Pain Location: "my foot" Pain Descriptors / Indicators: Cramping Pain Intervention(s): Repositioned;Monitored during session  Home Living                                          Prior Functioning/Environment              Frequency  Min 2X/week        Progress Toward Goals  OT Goals(current goals can now be found in the care plan section)  Progress towards OT goals: Not progressing toward goals - comment  Acute Rehab OT Goals Patient Stated Goal: daughter wants to take her home but torn because she knows she will get more therapy at SNF but may not be able to visit her there Time For Goal Achievement: 02/15/20 Potential to Achieve Goals: Good ADL Goals Pt Will Perform Grooming: with min assist;sitting Pt Will Perform Upper Body Bathing: sitting;with min assist Pt Will Transfer to Toilet: with mod assist;with +2 assist;stand pivot transfer;bedside commode Pt/caregiver will Perform Home Exercise Program: Increased strength;Both right and left upper extremity;With written HEP provided Additional ADL Goal #1: Patient will demonstrate ability to follow 1 step commands with 90% accuracy. Additional ADL Goal #2: Patient will engage in further visual assessment.  Plan Discharge plan remains appropriate    Co-evaluation    PT/OT/SLP  Co-Evaluation/Treatment: Yes Reason for Co-Treatment: To address functional/ADL transfers;For patient/therapist safety;Necessary to address cognition/behavior during functional activity PT goals addressed during session: Mobility/safety with mobility;Balance;Strengthening/ROM OT goals addressed during session: ADL's and self-care;Proper use of Adaptive equipment and DME;Strengthening/ROM      AM-PAC OT "6 Clicks" Daily Activity     Outcome Measure   Help from another person eating meals?: Total Help from another person taking care of personal grooming?: Total Help from another person toileting, which includes using toliet, bedpan, or urinal?: Total Help from another person bathing (including washing, rinsing, drying)?: Total Help from another person to put on and taking off regular upper body clothing?: Total Help from another person to put on and taking off regular lower body clothing?: Total 6 Click Score: 6    End of Session Equipment Utilized During Treatment: Rolling walker  OT Visit Diagnosis: Other abnormalities of gait and mobility (R26.89);Muscle weakness (generalized) (M62.81);Other symptoms and signs involving the nervous system (R29.898)   Activity Tolerance Patient tolerated treatment well   Patient Left in bed;with call bell/phone within reach;with family/visitor present   Nurse Communication Mobility status;Precautions        Time: 1448-1856 OT Time Calculation (min): 28 min  Charges: OT General Charges $OT Visit: 1 Visit OT Treatments $Self Care/Home Management : 8-22 mins   Brynn, OTR/L  Acute Rehabilitation Services Pager: [phone] Office: [phone] .  Mateo Flow 02/01/2020, 12:31 PM

## 2020-02-01 NOTE — Progress Notes (Signed)
Physical Therapy Treatment Patient Details Name: Sydney Schultz MRN: [id number] DOB: 08/03/1931 Today's Date: 02/01/2020    History of Present Illness Pt is a 86 y/o female with PMH of DM, HTN, OA; presenting with AMS and progressive decline in ADLS, mobility then L facial droop/slurred speech 9/4. MRI reveals acute lower pontine, R PCA territory infarct, L thalamic infarcts- embolic pattern.     PT Comments    Pt very difficult to motivate, daughter present in room encouraging pt throughout session. Pt required mod-total +2 for mobility today, pt significantly limited by weakness and resistance to mobility even with cuing and encouragement. Pt participated in multiple bouts of short distance gait training, improved with HHA +2 vs RW use. PT continuing to recommend SNF level of care post-acutely.    Follow Up Recommendations  SNF;Supervision/Assistance - 24 hour;Other (comment)     Equipment Recommendations  None recommended by PT    Recommendations for Other Services       Precautions / Restrictions Precautions Precautions: Fall Precaution Comments: NG tube Restrictions Weight Bearing Restrictions: No    Mobility  Bed Mobility Overal bed mobility: Needs Assistance Bed Mobility: Rolling;Supine to Sit;Sit to Supine Rolling: Total assist   Supine to sit: Total assist;+2 for physical assistance Sit to supine: Total assist;+2 for physical assistance   General bed mobility comments: total assist +2 for trunk and LE management, rolling bilaterally for proper pad placement.  Transfers Overall transfer level: Needs assistance Equipment used: Rolling walker (2 wheeled);2 person hand held assist Transfers: Sit to/from Stand Sit to Stand: Max assist;+2 physical assistance         General transfer comment: Max assist +2 for power up, steadying, and chest to upright. second stand attempt without RW and bilateral HHA, R lateral leaning.  Ambulation/Gait Ambulation/Gait  assistance: Mod assist;+2 physical assistance Gait Distance (Feet): 5 Feet (2x5, to sink and back) Assistive device: Rolling walker (2 wheeled) Gait Pattern/deviations: Step-to pattern;Decreased step length - right;Decreased step length - left;Shuffle;Trunk flexed;Narrow base of support Gait velocity: decr   General Gait Details: Mod +2 for steadying, laterally weightshifting for forward progression LEs. Step length improved with HHA, vs RW use. Seated rest break x1 minute   Stairs             Wheelchair Mobility    Modified Rankin (Stroke Patients Only) Modified Rankin (Stroke Patients Only) Pre-Morbid Rankin Score: Moderately severe disability Modified Rankin: Moderately severe disability     Balance Overall balance assessment: Needs assistance Sitting-balance support: Feet supported;No upper extremity supported Sitting balance-Leahy Scale: Zero Sitting balance - Comments: Patient unable to maintin static standing without external assist Postural control: Right lateral lean;Left lateral lean;Posterior lean Standing balance support: During functional activity;No upper extremity supported;Bilateral upper extremity supported Standing balance-Leahy Scale: Poor Standing balance comment: required both AD and external stability assist                            Cognition Arousal/Alertness: Awake/alert Behavior During Therapy: Restless;Agitated Overall Cognitive Status: Impaired/Different from baseline Area of Impairment: Attention;Following commands;Safety/judgement;Problem solving                   Current Attention Level: Sustained   Following Commands: Follows one step commands inconsistently;Follows one step commands with increased time Safety/Judgement: Decreased awareness of safety;Decreased awareness of deficits   Problem Solving: Requires verbal cues;Requires tactile cues;Slow processing;Difficulty sequencing;Decreased initiation General  Comments: Pt irritable with PT/OT, requiring max verbal encouragement  to perform mobility tasks. Pt stating "now wait just a minute, that's enough" and phrases similiar, daughter offering encouragement for pt throughout session.      Exercises      General Comments        Pertinent Vitals/Pain Pain Assessment: Faces Faces Pain Scale: Hurts little more Pain Location: "my foot" Pain Descriptors / Indicators: Cramping Pain Intervention(s): Limited activity within patient's tolerance;Monitored during session;Repositioned    Home Living                      Prior Function            PT Goals (current goals can now be found in the care plan section) Acute Rehab PT Goals Patient Stated Goal: daughter wants to take her home but torn because she knows she will get more therapy at SNF but may not be able to visit her there PT Goal Formulation: With family Time For Goal Achievement: 02/15/20 Potential to Achieve Goals: Fair    Frequency    Min 3X/week      PT Plan Current plan remains appropriate    Co-evaluation PT/OT/SLP Co-Evaluation/Treatment: Yes Reason for Co-Treatment: Complexity of the patient's impairments (multi-system involvement);For patient/therapist safety;Necessary to address cognition/behavior during functional activity;To address functional/ADL transfers PT goals addressed during session: Mobility/safety with mobility;Balance;Strengthening/ROM        AM-PAC PT "6 Clicks" Mobility   Outcome Measure  Help needed turning from your back to your side while in a flat bed without using bedrails?: Total Help needed moving from lying on your back to sitting on the side of a flat bed without using bedrails?: Total Help needed moving to and from a bed to a chair (including a wheelchair)?: Total Help needed standing up from a chair using your arms (Schultz.g., wheelchair or bedside chair)?: Total Help needed to walk in hospital room?: Total Help needed climbing  3-5 steps with a railing? : Total 6 Click Score: 6    End of Session   Activity Tolerance: Patient limited by fatigue Patient left: in bed;with family/visitor present;with restraints reapplied Nurse Communication: Mobility status PT Visit Diagnosis: Unsteadiness on feet (R26.81);Difficulty in walking, not elsewhere classified (R26.2);Other symptoms and signs involving the nervous system (R29.898);Hemiplegia and hemiparesis;Other abnormalities of gait and mobility (R26.89);Muscle weakness (generalized) (M62.81) Hemiplegia - Right/Left: Left Hemiplegia - dominant/non-dominant: Non-dominant Hemiplegia - caused by: Cerebral infarction     Time: 1051-1120 PT Time Calculation (min) (ACUTE ONLY): 29 min  Charges:  $Gait Training: 8-22 mins                    Sydney Schultz, PT Acute Rehabilitation Services Pager [phone]  Office [phone]    Lajeana Strough D Despina Hidden 02/01/2020, 12:02 PM

## 2020-02-01 NOTE — Care Management Important Message (Signed)
Important Message  Patient Details  Name: Sydney Schultz MRN: [id number] Date of Birth: 10-09-1931   Medicare Important Message Given:  Yes     Dorena Bodo 02/01/2020, 1:06 PM

## 2020-02-01 NOTE — Progress Notes (Signed)
SLP Cancellation Note  Patient Details Name: Sydney Schultz MRN: [id number] DOB: 12-03-1931   Cancelled treatment:       Reason Eval/Treat Not Completed: Other (comment) Unable to assess diet tolerance at this time, as pt is currently NPO for PEG placement. RN reports PO intake continues to be poor. ST will continue to follow.  Terrence Pizana B. Murvin Natal, [facility], CCC-SLP Speech Language Pathologist Office: [phone] Pager: [phone]  Leigh Aurora 02/01/2020, 1:26 PM

## 2020-02-01 NOTE — TOC Progression Note (Signed)
Transition of Care [facility]) - Progression Note    Patient Details  Name: Sydney Schultz MRN: [id number] Date of Birth: 1931-10-01  Transition of [facility]) CM/SW Contact  Baldemar Lenis, [place] Phone Number: 02/01/2020, 12:13 PM  Clinical Narrative:   CSW received insurance approval for patient to admit to SNF when stable, after peg tube placed. CSW sent auth information to [employer]. CSW spoke with daughter, [name], to update her on timing of discharge, possible tomorrow vs Thursday pending progress of peg tube feedings. [name] also asked about discharge from SNF and what the patient might need, and CSW answered questions about what to expect whenever the patient is ready to discharge from SNF. [name] appreciative of information. CSW to follow.    Expected Discharge Plan: Skilled Nursing Facility Barriers to Discharge: Continued Medical Work up  Expected Discharge Plan and Services Expected Discharge Plan: Skilled Nursing Facility       Living arrangements for the past 2 months: Single Family Home                                       Social Determinants of Health (SDOH) Interventions    Readmission Risk Interventions No flowsheet data found.

## 2020-02-02 ENCOUNTER — Inpatient Hospital Stay (HOSPITAL_COMMUNITY): Payer: Medicare PPO

## 2020-02-02 HISTORY — PX: IR GASTROSTOMY TUBE MOD SED: IMG625

## 2020-02-02 LAB — GLUCOSE, CAPILLARY
Glucose-Capillary: 110 mg/dL — ABNORMAL HIGH (ref 70–99)
Glucose-Capillary: 131 mg/dL — ABNORMAL HIGH (ref 70–99)
Glucose-Capillary: 139 mg/dL — ABNORMAL HIGH (ref 70–99)
Glucose-Capillary: 72 mg/dL (ref 70–99)
Glucose-Capillary: 92 mg/dL (ref 70–99)
Glucose-Capillary: 93 mg/dL (ref 70–99)
Glucose-Capillary: 99 mg/dL (ref 70–99)

## 2020-02-02 MED ORDER — GLUCAGON HCL RDNA (DIAGNOSTIC) 1 MG IJ SOLR
INTRAMUSCULAR | Status: AC
  Filled 2020-02-02: qty 1

## 2020-02-02 MED ORDER — GLUCAGON HCL (RDNA) 1 MG IJ SOLR
INTRAMUSCULAR | Status: AC | PRN
  Administered 2020-02-02: 1 mg via INTRAVENOUS

## 2020-02-02 MED ORDER — FREE WATER
100.0000 mL | Freq: Four times a day (QID) | Status: DC
  Administered 2020-02-03 – 2020-02-04 (×6): 100 mL

## 2020-02-02 MED ORDER — LIDOCAINE HCL 1 % IJ SOLN
INTRAMUSCULAR | Status: AC
  Filled 2020-02-02: qty 20

## 2020-02-02 MED ORDER — CEFAZOLIN SODIUM-DEXTROSE 2-4 GM/100ML-% IV SOLN
2.0000 g | Freq: Once | INTRAVENOUS | Status: AC
  Administered 2020-02-02: 2 g via INTRAVENOUS

## 2020-02-02 MED ORDER — FENTANYL CITRATE (PF) 100 MCG/2ML IJ SOLN
INTRAMUSCULAR | Status: AC | PRN
  Administered 2020-02-02: 25 ug via INTRAVENOUS

## 2020-02-02 MED ORDER — IOHEXOL 300 MG/ML  SOLN
50.0000 mL | Freq: Once | INTRAMUSCULAR | Status: AC | PRN
  Administered 2020-02-02: 30 mL

## 2020-02-02 MED ORDER — IRBESARTAN 150 MG PO TABS
75.0000 mg | ORAL_TABLET | Freq: Every day | ORAL | Status: DC
  Administered 2020-02-03 – 2020-02-04 (×2): 75 mg via ORAL
  Filled 2020-02-02 (×2): qty 1

## 2020-02-02 MED ORDER — MIDAZOLAM HCL 2 MG/2ML IJ SOLN
INTRAMUSCULAR | Status: AC | PRN
  Administered 2020-02-02: 1 mg via INTRAVENOUS

## 2020-02-02 MED ORDER — DOCUSATE SODIUM 50 MG/5ML PO LIQD: 50.0000 mg | Freq: Every evening | ORAL | Status: DC | PRN

## 2020-02-02 MED ORDER — SENNOSIDES 8.8 MG/5ML PO SYRP
5.0000 mL | ORAL_SOLUTION | Freq: Every evening | ORAL | Status: DC | PRN
  Filled 2020-02-02: qty 5

## 2020-02-02 MED ORDER — LIDOCAINE HCL 1 % IJ SOLN
INTRAMUSCULAR | Status: AC | PRN
  Administered 2020-02-02 (×2): 10 mL via INTRADERMAL

## 2020-02-02 MED ORDER — FENTANYL CITRATE (PF) 100 MCG/2ML IJ SOLN
INTRAMUSCULAR | Status: AC
  Filled 2020-02-02: qty 2

## 2020-02-02 MED ORDER — CEFAZOLIN SODIUM-DEXTROSE 2-4 GM/100ML-% IV SOLN
INTRAVENOUS | Status: AC
  Filled 2020-02-02: qty 100

## 2020-02-02 MED ORDER — CLOPIDOGREL BISULFATE 75 MG PO TABS
75.0000 mg | ORAL_TABLET | Freq: Every evening | ORAL | Status: DC
  Administered 2020-02-03: 75 mg
  Filled 2020-02-02: qty 1

## 2020-02-02 MED ORDER — MIDAZOLAM HCL 2 MG/2ML IJ SOLN
INTRAMUSCULAR | Status: AC
  Filled 2020-02-02: qty 2

## 2020-02-02 NOTE — Progress Notes (Signed)
PROGRESS NOTE    Sydney Schultz  JOI:[id number] DOB: 14-Jun-1931 DOA: 01/16/2020 PCP: Jaclyn Shaggy, MD    Brief Narrative:  86 y.o.femalewith medical history significant ofDM, HTN, OSA and OA. She has been followed by neurology at [facility] with last OV 02/17/19 for f/u of evaluation for change in consciousness episode. Evaluation was non-focal. MRI at that time revealed cerebral atrophy and 2 old lacuanr infarcts right cerebral hemisphere. Patient has had progressive decline and prior to the precipitating event required assistance with dressing, toileting and mobility. On 01/15/20 she had increased weakness and around 5 PM was noted to hve developed a left facial droop and slurred speech. EMS was called and patient was brought to [facility] for further evaluation.   Assessment & Plan:   Principal Problem:   CVA (cerebral vascular accident) (HCC) Active Problems:   DM (diabetes mellitus) (HCC)   HTN (hypertension)   GERD (gastroesophageal reflux disease)   OSA (obstructive sleep apnea)   Malnutrition of moderate degree   Acute CVA (cerebral vascular accident) - possibly embolic MRI with acute R PCA territory infarct predominantly involving the temporal and occipital cortex.  Small acute infarct of L thalamus.  Short segment occlusion of the R P2 segment with distal reconstitution demonstrated on earlier CTA.  Severe chronic microangiopathy. Per neurology the stroke is embolic pattern.  Source is unclear. Recommending 30 day cardiac event monitor to r/o atrial fibrillation.  Message was sent to cardiology to arrange.  Will need to remind them prior to discharge. LDL 131. Zocor 40 + zetia 10 - A1c 5.6 Continue DAPT x 3 months and then plavix alone  Plavix is on hold for PEG tube placement.  This was discussed with neurology.  Continue aspirin.   Patient remains stable from a neurological standpoint.  Dysphagia Cortrak placed 9/8. SLP continues to follow. Recommendation for PEG for  prolonged supplemental nutrition while undergoing ST.  Discussions were held with patient's daughters.  They were agreeable to proceed with PEG tube placement after initial hesitation.  Interventional radiology was consulted.  Plavix has to be held for 5 days. Patient was upgraded to dysphagia 1 diet however her oral intake remains poor. Plan was for PEG tube placement on 9/21.  However could not be done yesterday.  Patient to be taken to interventional radiology today.  Acute Metabolic Encephalopathy:  Patient with history of unresponsive spells per neurology notes.  Following with Dr. Malvin Johns and NP Morrie Sheldon at [facility]. Lethargy on 9/9 may have been 2/2 UTI. Mental status stable.  UTI with E. coli Completed course of ceftriaxone, pan-sensitive ecoli on UA.  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  Patient was on Jardiance at home.  Currently on SSI.  CBGs are reasonably well controlled.   Essential hypertension  At home she was noted to be on irbesartan and HCTZ.  For the past several days her blood pressure has been running low normal so her antihypertensives were held.  Blood pressure now trending in the hypertensive range.  We will resume her irbesartan from tomorrow.  Dementia Continue Aricept  CKD3a Renal function is stable.    Cough CXR performed earlier without focal infiltrate.    Appears to have resolved.  Normocytic anemia Likely anemia of chronic disease.  No overt bleeding.  Hemoglobin is stable.  DVT prophylaxis: Lovenox subq Code Status: DNR Family Communication: Discussed with patient daughter who was at the bedside  Disposition: Plan is for the patient to go to SNF.  PEG tube hopefully today..  Status is: Inpatient  Remains inpatient appropriate because:Ongoing diagnostic testing needed not appropriate for outpatient work up and Unsafe d/c plan   Dispo:  The patient is from: Home              Anticipated d/c is to: SNF              Anticipated d/c date is: 2 days              Patient currently is not medically stable to d/c.    Consultants:   Neurology  Interventional radiology  Procedures:     Antimicrobials: Anti-infectives (From admission, onward)   Start     Dose/Rate Route Frequency Ordered Stop   02/02/20 0854  ceFAZolin (ANCEF) 2-4 GM/100ML-% IVPB       Note to Pharmacy: Joesph July: cabinet override      02/02/20 0854 02/02/20 2059   02/02/20 0800  ceFAZolin (ANCEF) IVPB 2g/100 mL premix        2 g 200 mL/hr over 30 Minutes Intravenous  Once 02/02/20 0747     02/01/20 0000  ceFAZolin (ANCEF) IVPB 2g/100 mL premix        2 g 200 mL/hr over 30 Minutes Intravenous  Once 01/27/20 1531 02/01/20 0041   01/20/20 1930  cefTRIAXone (ROCEPHIN) 1 g in sodium chloride 0.9 % 100 mL IVPB        1 g 200 mL/hr over 30 Minutes Intravenous Every 24 hours 01/20/20 1901 01/25/20 1959      Subjective: Patient remains pleasantly confused.  No changes compared to yesterday.  Objective: Vitals:   02/02/20 0000 02/02/20 0344 02/02/20 0724 02/02/20 0850  BP: 133/72 (!) 149/76 (!) 148/82 (!) 168/62  Pulse: 74 79 79 78  Resp: 16 17 18 14   Temp: 98 F (36.7 C) 98.1 F (36.7 C) 97.9 F (36.6 C)   TempSrc: Oral Oral Oral   SpO2: 96%  100% 98%  Weight:      Height:        Intake/Output Summary (Last 24 hours) at 02/02/2020 0903 Last data filed at 02/01/2020 2359 Gross per 24 hour  Intake 528 ml  Output --  Net 528 ml   Filed Weights   01/29/20 0500 01/30/20 0450 01/31/20 0404  Weight: 79.1 kg 80.5 kg 80 kg    Examination:  General appearance: Awake alert.  In no distress.  Distracted Resp: Clear to auscultation bilaterally.  Normal effort Cardio: S1-S2 is normal regular.  No S3-S4.  No rubs murmurs or bruit GI: Abdomen is soft.  Nontender nondistended.  Bowel sounds are present normal.  No masses  organomegaly     Data Reviewed: I have personally reviewed following labs and imaging studies  CBC: Recent Labs  Lab 01/29/20 0136 02/01/20 0400  WBC 4.0 4.8  HGB 9.8* 9.9*  HCT 31.5* 31.6*  MCV 96.0 95.2  PLT 298 268   Basic Metabolic Panel: Recent Labs  Lab 01/29/20 0136 02/01/20 0400  NA 141 142  K 4.5 4.2  CL 107 107  CO2 27 26  GLUCOSE 127* 120*  BUN 20 20  CREATININE 0.72 0.73  CALCIUM 9.6 9.7   GFR: Estimated Creatinine Clearance: 50.4 mL/min (by C-G formula based on SCr of 0.73 mg/dL).  CBG: Recent Labs  Lab 02/01/20 1628 02/01/20 1949 02/01/20 2359 02/02/20 0346 02/02/20 0726  GLUCAP 103* 131* 110* 99 93  No results found for this or any previous visit (from the past 240 hour(s)).   Radiology Studies: No results found.  Scheduled Meds: . fentaNYL      . glucagon (human recombinant)      . lidocaine      . midazolam      .  stroke: mapping our early stages of recovery book   Does not apply Once  . acetaminophen (TYLENOL) oral liquid 160 mg/5 mL  650 mg Per Tube BID  . artificial tears   Left Eye QHS  . aspirin  325 mg Per Tube Daily  . clopidogrel  75 mg Per Tube QPM  . colestipol  2 g Per Tube QPM  . collagenase   Topical Daily  . enoxaparin (LOVENOX) injection  40 mg Subcutaneous Q24H  . simvastatin  40 mg Per Tube q1800   And  . ezetimibe  10 mg Per Tube q1800  . free water  100 mL Per Tube Q6H  . insulin aspart  0-9 Units Subcutaneous Q4H  . oxybutynin  5 mg Per Tube BID  . pantoprazole sodium  40 mg Per Tube Daily  . polyvinyl alcohol  1 drop Left Eye QID   Continuous Infusions: . ceFAZolin    .  ceFAZolin (ANCEF) IV 2 g (02/02/20 0857)  . feeding supplement (OSMOLITE 1.2 CAL) Stopped (02/02/20 0030)     LOS: 17 days   Osvaldo Shipper, MD [facility] Pager On Amion  If 7PM-7AM, please contact night-coverage 02/02/2020, 9:03 AM

## 2020-02-02 NOTE — Progress Notes (Signed)
  Speech Language Pathology Treatment: Dysphagia;Cognitive-Linquistic  Patient Details Name: Sydney Schultz MRN: [id number] DOB: [birth year] Today's Date: 02/02/2020 Time: 1337-1400 SLP Time Calculation (min) (ACUTE ONLY): 23 min  Assessment / Plan / Recommendation Clinical Impression  Pt was seen for treatment with her daughter present. Pt's daughter indicated that the pt wants water but cannot have any because she is NPO s/p PEG placement which is contradictory to IR's note. Clarification was sought from [place], [place] and it was determined that the pt's G-tube may not be used for feeds for 24 hours but that the pt may have p.o. meals as she wishes. Pt tolerated dysphagia 1 solids and thin liquids via straw without overt s/sx of aspiration but continues to have reduced labial stripping on the left. She was educated regarding the nature of dysarthria, and compensatory strategies to improve speech intelligibility. Pt verbalized understanding regarding all areas of education but exhibited difficulty using compensatory strategies even when cues were given. Pt was oriented to person and was able to respond accurately to temporal orientation questions when reasoning cues were given. Pt required consistent verbal prompts for focused attention and redirection was frequently needed. The session was ultimately terminated to allow pt to rest since she reported she was fatigued and exhibited increasing difficulty maintaining alertness. SLP will continue to follow pt.    HPI HPI: Pt is an 86 yo female presenting with AMS and L facial droop. MRI showed R lower pontine, R PCA, and L thalamic infarcts. PMH includes: DM, TIAs, GERD, HTN, OSA, wheezing, OA      SLP Plan  Continue with current plan of care       Recommendations  Liquids provided via: Cup;Straw Medication Administration: Whole meds with puree Supervision: Staff to assist with self feeding;Full supervision/cueing for compensatory  strategies Compensations: Slow rate;Small sips/bites;Minimize environmental distractions;Monitor for anterior loss Postural Changes and/or Swallow Maneuvers: Seated upright 90 degrees;Upright 30-60 min after meal                Oral Care Recommendations: Oral care BID Follow up Recommendations: Skilled Nursing facility SLP Visit Diagnosis: Dysphagia, oropharyngeal phase (R13.12);Dysarthria and anarthria (R47.1) Plan: Continue with current plan of care       Lauranne Beyersdorf I. Vear Clock, MS, CCC-SLP Acute Rehabilitation Services Office number [phone] Pager [phone]                Scheryl Marten 02/02/2020, 2:45 PM

## 2020-02-02 NOTE — Procedures (Signed)
Pre procedure Dx: Dysphagia Post Procedure Dx: Same  Successful fluoroscopic guided insertion of gastrostomy tube.   The gastrostomy tube may be used immediately for medications.   Tube feeds may be initiated in 24 hours as per the primary team.    EBL: Minimal  Complications: None immediate  Jay Gibran Veselka, MD Pager #: [phone]    

## 2020-02-02 NOTE — Sedation Documentation (Signed)
Attempted to call report, RN not available at this time

## 2020-02-02 NOTE — Progress Notes (Signed)
MEDICATION RELATED CONSULT NOTE - FOLLOW UP   Pharmacy Consult for restart of anticoagulation post IR procedure   No Known Allergies  Patient Measurements: Height: 5\' 2"  (157.5 cm) Weight: 80 kg (176 lb 5.9 oz) (air bed) IBW/kg (Calculated) : 50.1   Vital Signs: Temp: 97.9 F (36.6 C) (09/22 0724) Temp Source: Oral (09/22 0724) BP: 166/69 (09/22 0945) Pulse Rate: 74 (09/22 0945) Intake/Output from previous day: 09/21 0701 - 09/22 0700 In: 528 [NG/GT:528] Out: -  Intake/Output from this shift: Total I/O In: 100 [IV Piggyback:100] Out: -   Labs: Recent Labs    02/01/20 0400  WBC 4.8  HGB 9.9*  HCT 31.6*  PLT 268  CREATININE 0.73   Estimated Creatinine Clearance: 50.4 mL/min (by C-G formula based on SCr of 0.73 mg/dL).   Microbiology: Recent Results (from the past 720 hour(s))  SARS Coronavirus 2 by RT PCR (hospital order, performed in [facility] hospital lab) Nasopharyngeal Nasopharyngeal Swab     Status: None   Collection Time: 01/16/20  5:25 AM   Specimen: Nasopharyngeal Swab  Result Value Ref Range Status   SARS Coronavirus 2 NEGATIVE NEGATIVE Final    Comment: (NOTE) SARS-CoV-2 target nucleic acids are NOT DETECTED.  The SARS-CoV-2 RNA is generally detectable in upper and lower respiratory specimens during the acute phase of infection. The lowest concentration of SARS-CoV-2 viral copies this assay can detect is 250 copies / mL. A negative result does not preclude SARS-CoV-2 infection and should not be used as the sole basis for treatment or other patient management decisions.  A negative result may occur with improper specimen collection / handling, submission of specimen other than nasopharyngeal swab, presence of viral mutation(s) within the areas targeted by this assay, and inadequate number of viral copies (<250 copies / mL). A negative result must be combined with clinical observations, patient history, and epidemiological information.  Fact  Sheet for Patients:   03/17/20  Fact Sheet for Healthcare Providers: BoilerBrush.com.cy  This test is not yet approved or  cleared by the https://pope.com/ FDA and has been authorized for detection and/or diagnosis of SARS-CoV-2 by FDA under an Emergency Use Authorization (EUA).  This EUA will remain in effect (meaning this test can be used) for the duration of the COVID-19 declaration under Section 564(b)(1) of the Act, 21 U.S.C. section 360bbb-3(b)(1), unless the authorization is terminated or revoked sooner.  Performed at [facility], [address], [place], [place]   Culture, Urine     Status: Abnormal   Collection Time: 01/20/20  6:00 PM   Specimen: Urine, Random  Result Value Ref Range Status   Specimen Description URINE, RANDOM  Final   Special Requests   Final    NONE Performed at [facility], [address] [place], [place]    Culture >=100,000 COLONIES/mL ESCHERICHIA COLI (A)  Final   Report Status 01/23/2020 FINAL  Final   Organism ID, Bacteria ESCHERICHIA COLI (A)  Final      Susceptibility   Escherichia coli - MIC*    AMPICILLIN <=2 SENSITIVE Sensitive     CEFAZOLIN <=4 SENSITIVE Sensitive     CEFTRIAXONE <=0.25 SENSITIVE Sensitive     CIPROFLOXACIN <=0.25 SENSITIVE Sensitive     GENTAMICIN <=1 SENSITIVE Sensitive     IMIPENEM <=0.25 SENSITIVE Sensitive     NITROFURANTOIN <=16 SENSITIVE Sensitive     TRIMETH/SULFA <=20 SENSITIVE Sensitive     AMPICILLIN/SULBACTAM <=2 SENSITIVE Sensitive  PIP/TAZO <=4 SENSITIVE Sensitive     * >=100,000 COLONIES/mL ESCHERICHIA COLI     Assessment: Patient s/p PEG tube placement by IR. Patient on ASA, Plavix and prophylactic enoxaparin prior to procedure. Plavix has already been ordered to restart on 9/23 at 1800 per MD. Pharmacy asked to evaluate other anticoagulation for standard risk to bleed procedure. Patient on lovenox for VTE  prophylaxis. Will retime next dose to next day (9/23) in AM per protocol.    Plan:  Hold enoxaparin this AM and restart on 9/23 per protocol.   Hazem Kenner A. Jeanella Craze, PharmD, BCPS, FNKF Clinical Pharmacist [place] Please utilize Amion for appropriate phone number to reach the unit pharmacist [facility])   02/02/2020,10:13 AM

## 2020-02-03 LAB — CBC
HCT: 31.6 % — ABNORMAL LOW (ref 36.0–46.0)
Hemoglobin: 10.1 g/dL — ABNORMAL LOW (ref 12.0–15.0)
MCH: 30.1 pg (ref 26.0–34.0)
MCHC: 32 g/dL (ref 30.0–36.0)
MCV: 94.3 fL (ref 80.0–100.0)
Platelets: 340 10*3/uL (ref 150–400)
RBC: 3.35 MIL/uL — ABNORMAL LOW (ref 3.87–5.11)
RDW: 14 % (ref 11.5–15.5)
WBC: 7.4 10*3/uL (ref 4.0–10.5)
nRBC: 0 % (ref 0.0–0.2)

## 2020-02-03 LAB — GLUCOSE, CAPILLARY
Glucose-Capillary: 110 mg/dL — ABNORMAL HIGH (ref 70–99)
Glucose-Capillary: 145 mg/dL — ABNORMAL HIGH (ref 70–99)
Glucose-Capillary: 154 mg/dL — ABNORMAL HIGH (ref 70–99)
Glucose-Capillary: 86 mg/dL (ref 70–99)
Glucose-Capillary: 92 mg/dL (ref 70–99)
Glucose-Capillary: 92 mg/dL (ref 70–99)

## 2020-02-03 LAB — COMPREHENSIVE METABOLIC PANEL
ALT: 14 U/L (ref 0–44)
AST: 25 U/L (ref 15–41)
Albumin: 2.6 g/dL — ABNORMAL LOW (ref 3.5–5.0)
Alkaline Phosphatase: 92 U/L (ref 38–126)
Anion gap: 8 (ref 5–15)
BUN: 16 mg/dL (ref 8–23)
CO2: 24 mmol/L (ref 22–32)
Calcium: 9.6 mg/dL (ref 8.9–10.3)
Chloride: 108 mmol/L (ref 98–111)
Creatinine, Ser: 0.72 mg/dL (ref 0.44–1.00)
GFR calc Af Amer: >60 mL/min (ref >60)
GFR calc non Af Amer: >60 mL/min (ref >60)
Glucose, Bld: 87 mg/dL (ref 70–99)
Potassium: 3.9 mmol/L (ref 3.5–5.1)
Sodium: 140 mmol/L (ref 135–145)
Total Bilirubin: 0.7 mg/dL (ref 0.3–1.2)
Total Protein: 5.2 g/dL — ABNORMAL LOW (ref 6.5–8.1)

## 2020-02-03 LAB — SARS CORONAVIRUS 2 (TAT 6-24 HRS): SARS Coronavirus 2: NEGATIVE

## 2020-02-03 MED ORDER — OXYBUTYNIN CHLORIDE 5 MG PO TABS: 5.0000 mg | ORAL_TABLET | Freq: Two times a day (BID) | ORAL | Status: AC

## 2020-02-03 MED ORDER — SIMVASTATIN 40 MG PO TABS: 40.0000 mg | ORAL_TABLET | Freq: Every day | ORAL | Status: AC

## 2020-02-03 MED ORDER — FREE WATER: 100.0000 mL | Freq: Four times a day (QID) | Status: AC

## 2020-02-03 MED ORDER — PANTOPRAZOLE SODIUM 40 MG PO PACK: 40.0000 mg | PACK | Freq: Every day | ORAL | Status: AC

## 2020-02-03 MED ORDER — DOCUSATE SODIUM 50 MG/5ML PO LIQD: 50.0000 mg | Freq: Every evening | ORAL | 0 refills | Status: AC | PRN

## 2020-02-03 MED ORDER — OSMOLITE 1.2 CAL PO LIQD: 1000.0000 mL | ORAL | 0 refills | Status: AC

## 2020-02-03 MED ORDER — EZETIMIBE 10 MG PO TABS: 10.0000 mg | ORAL_TABLET | Freq: Every day | ORAL | Status: AC

## 2020-02-03 MED ORDER — CLOPIDOGREL BISULFATE 75 MG PO TABS: 75.0000 mg | ORAL_TABLET | Freq: Every evening | ORAL | Status: AC

## 2020-02-03 MED ORDER — ARTIFICIAL TEARS OPHTHALMIC OINT: TOPICAL_OINTMENT | Freq: Every day | OPHTHALMIC | Status: AC

## 2020-02-03 MED ORDER — ACETAMINOPHEN 325 MG PO TABS: 650.0000 mg | ORAL_TABLET | ORAL | Status: AC | PRN

## 2020-02-03 MED ORDER — POLYVINYL ALCOHOL 1.4 % OP SOLN: 1.0000 [drp] | Freq: Four times a day (QID) | OPHTHALMIC | 0 refills | Status: AC

## 2020-02-03 MED ORDER — IRBESARTAN 75 MG PO TABS: 75.0000 mg | ORAL_TABLET | Freq: Every day | ORAL | Status: AC

## 2020-02-03 MED ORDER — ASPIRIN 325 MG PO TABS: 325.0000 mg | ORAL_TABLET | Freq: Every day | ORAL | Status: AC

## 2020-02-03 MED ORDER — SENNOSIDES 8.8 MG/5ML PO SYRP: 5.0000 mL | ORAL_SOLUTION | Freq: Every evening | ORAL | 0 refills | Status: AC | PRN

## 2020-02-03 MED ORDER — ACETAMINOPHEN 160 MG/5ML PO SOLN: 650.0000 mg | Freq: Two times a day (BID) | ORAL | 0 refills | Status: AC

## 2020-02-03 MED ORDER — COLESTIPOL HCL 1 G PO TABS: 2.0000 g | ORAL_TABLET | Freq: Every evening | ORAL | Status: AC

## 2020-02-03 MED ORDER — DONEPEZIL HCL 5 MG PO TABS: 5.0000 mg | ORAL_TABLET | Freq: Every day | ORAL | Status: AC

## 2020-02-03 MED ORDER — TOPIRAMATE 50 MG PO TABS: 50.0000 mg | ORAL_TABLET | Freq: Every day | ORAL | Status: AC

## 2020-02-03 NOTE — Discharge Summary (Signed)
[facility]  Physician Discharge Summary   Patient ID: Sydney Schultz MRN: [id number] DOB/AGE: 84-Jul-1933 84 y.o.  Admit date: 01/16/2020 Discharge date: 02/03/2020  PCP: Jaclyn Shaggy, MD  DISCHARGE DIAGNOSES:  Acute CVA Oropharyngeal dysphagia PEG tube feedings Acute metabolic encephalopathy UTI with E. 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 Please check her CBGs every 6 hours.  Use sliding scale as per protocol 2. Free water flushes down the PEG tube every 6 hours as mention on her medication list. 3. Check residuals every day. 4. Palliative care consult at SNF.    Home Health: Going to SNF Equipment/Devices: Per SNF  CODE STATUS: DNR  DISCHARGE CONDITION: fair  Diet recommendation: Tube feedings with Osmolite at 60 mL/h.  See under medication list   INITIAL HISTORY: 86 y.o.femalewith medical history significant ofDM, HTN, OSA and OA. She has been followed by neurology at [facility] with last OV 02/17/19 for f/u of evaluation for change in consciousness episode. Evaluation was non-focal. MRI at that time revealed cerebral atrophy and 2 old lacuanr infarcts right cerebral hemisphere. Patient has had progressive decline and prior to the precipitating event required assistance with dressing, toileting and mobility. On 01/15/20 she had increased weakness and around 5 PM was noted to hve developed a left facial droop and slurred speech. EMS was called and patient was brought to [facility] for further evaluation.  Consultations:  Neurology  Interventional radiology  Procedures:  PEG tube placement   HOSPITAL COURSE:   Acute CVA (cerebral vascular accident) - possibly embolic MRI with acute R PCA territory infarct predominantly involving the temporal and occipital cortex. Small acute infarct of L  thalamus. Short segment occlusion of the R P2 segment with distal reconstitution demonstrated on earlier CTA. Severe chronic microangiopathy. Per neurology the stroke is embolic pattern.  Source is unclear. Recommending 30 day cardiac event monitor to r/o atrial fibrillation.  Message was sent to cardiology to arrange.  Will send another message to cardiology. LDL 131. Zocor 40 + zetia 10 HbA1c 5.6. Continue DAPT x 3 months and then plavix alone  Plavix was placed on hold for PEG tube placement.  This was discussed with neurology.   PEG tube was placed yesterday.  Plavix can be resumed today.  Continue aspirin as well.  Patient remained stable from a neurological standpoint.  Dysphagia Cortrak placed 9/8. SLP continues to follow. Recommendation for PEG for prolonged supplemental nutrition while undergoing ST.  Discussions were held with patient's daughters.  They were agreeable to proceed with PEG tube placement after initial hesitation.  Interventional radiology was consulted.    Plavix was held for 5 days.  PEG tube was placed on 9/22.  Tube feedings will be changed over to pick this morning.  Cortrack was removed yesterday.   Free water flushes. If patient tolerates tube feedings through the PEG then she could potentially be discharged to SNF tomorrow.  Acute Metabolic Encephalopathy:  Patient with history of unresponsive spells per neurology notes. Following with Dr. Malvin Johns and NP Morrie Sheldon at [place] [facility]. Lethargy on 9/9 may have been 2/2 UTI. Mental status remains stable.  UTI with E. coli Completed course of ceftriaxone, pan-sensitive ecoli on UA.  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  Patient was on Jardiance at home.    Monitor  CBGs while at SNF.  We will continue to hold her Jardiance.    Essential hypertension  At home she was noted to be on irbesartan and HCTZ.  For the  past several days her blood pressure has been running low normal so her antihypertensives were held.    Subsequently resumed her irbesartan due to elevated blood pressure.  Blood pressure now better controlled.  Continue to hold her HCTZ.  Dementia Continue Aricept  CKD3a Renal function is stable.    Cough CXR performed earlier without focal infiltrate.   Appears to have resolved.  Normocytic anemia Likely anemia of chronic disease.  No overt bleeding.  Hemoglobin is stable.  b Pressure Injury 01/27/20 Sacrum Mid Unstageable - Full thickness tissue loss in which the base of the injury is covered by slough (yellow, tan, gray, green or brown) and/or eschar (tan, brown or black) in the wound bed. (Active)  01/27/20   Location: Sacrum  Location Orientation: Mid  Staging: Unstageable - Full thickness tissue loss in which the base of the injury is covered by slough (yellow, tan, gray, green or brown) and/or eschar (tan, brown or black) in the wound bed.  Wound Description (Comments):   Present on Admission: No   Obesity Estimated body mass index is 32.26 kg/m as calculated from the following:   Height as of this encounter: 5\' 2"  (1.575 m).   Weight as of this encounter: 80 kg.  Moderate protein calorie malnutrition Nutrition Problem: Moderate Malnutrition Etiology: chronic illness (TIA, CAD)  Signs/Symptoms: energy intake < 75% for > or equal to 1 month, mild fat depletion, moderate fat depletion, mild muscle depletion, moderate muscle depletion  Interventions: Tube feeding  Overall stable.  If patient tolerates her tube feedings and she remained stable otherwise she could be discharged tomorrow to SNF.  Blood work from this morning seems to be stable.   PERTINENT LABS:  The results of significant diagnostics from this hospitalization (including imaging, microbiology, ancillary and laboratory) are listed below for reference.     Labs:     Basic Metabolic  Panel: Recent Labs  Lab 01/29/20 0136 02/01/20 0400 02/03/20 0402  NA 141 142 140  K 4.5 4.2 3.9  CL 107 107 108  CO2 27 26 24   GLUCOSE 127* 120* 87  BUN 20 20 16   CREATININE 0.72 0.73 0.72  CALCIUM 9.6 9.7 9.6   Liver Function Tests: Recent Labs  Lab 02/03/20 0402  AST 25  ALT 14  ALKPHOS 92  BILITOT 0.7  PROT 5.2*  ALBUMIN 2.6*   CBC: Recent Labs  Lab 01/29/20 0136 02/01/20 0400 02/03/20 0402  WBC 4.0 4.8 7.4  HGB 9.8* 9.9* 10.1*  HCT 31.5* 31.6* 31.6*  MCV 96.0 95.2 94.3  PLT 298 268 340    CBG: Recent Labs  Lab 02/02/20 1543 02/02/20 2024 02/02/20 2313 02/03/20 0308 02/03/20 0755  GLUCAP 72 92 131* 86 92     IMAGING STUDIES CT Code Stroke CTA Head W/WO contrast  Addendum Date: 01/16/2020   ADDENDUM REPORT: 01/16/2020 09:13 ADDENDUM: Dr. Roda Shutters called for clarification of an arterial stenosis reference in a subsequent brain MRI report. The patient's right PCA territory infarct is associated with a severe P2 segment stenosis with flow gap. There is also a flow reducing left P1 segment stenosis which is ameliorated by a posterior communicating artery. Electronically Signed   By: Marnee Spring M.D.   On: 01/16/2020 09:13   Result Date: 01/16/2020 CLINICAL DATA:  Facial droop EXAM:  CT ANGIOGRAPHY HEAD AND NECK TECHNIQUE: Multidetector CT imaging of the head and neck was performed using the standard protocol during bolus administration of intravenous contrast. Multiplanar CT image reconstructions and MIPs were obtained to evaluate the vascular anatomy. Carotid stenosis measurements (when applicable) are obtained utilizing NASCET criteria, using the distal internal carotid diameter as the denominator. CONTRAST:  75mL OMNIPAQUE IOHEXOL 350 MG/ML SOLN COMPARISON:  None. FINDINGS: CTA NECK FINDINGS SKELETON: There is no bony spinal canal stenosis. No lytic or blastic lesion. OTHER NECK: Normal pharynx, larynx and major salivary glands. No cervical lymphadenopathy.  Unremarkable thyroid gland. UPPER CHEST: No pneumothorax or pleural effusion. No nodules or masses. AORTIC ARCH: There is mild calcific atherosclerosis of the aortic arch. There is no aneurysm, dissection or hemodynamically significant stenosis of the visualized portion of the aorta. Conventional 3 vessel aortic branching pattern. The visualized proximal subclavian arteries are widely patent. RIGHT CAROTID SYSTEM: No dissection, occlusion or aneurysm. Mild atherosclerotic calcification at the carotid bifurcation without hemodynamically significant stenosis. LEFT CAROTID SYSTEM: No dissection, occlusion or aneurysm. There is mixed density atherosclerosis extending into the proximal ICA, resulting in 50% stenosis. VERTEBRAL ARTERIES: Left dominant configuration. Both origins are clearly patent. There is no dissection, occlusion or flow-limiting stenosis to the skull base (V1-V3 segments). CTA HEAD FINDINGS POSTERIOR CIRCULATION: --Vertebral arteries: Normal V4 segments. --Inferior cerebellar arteries: Normal. --Basilar artery: Normal. --Superior cerebellar arteries: Normal. --Posterior cerebral arteries (PCA): Normal. There are bilateral posterior communicating arteries (p-comm) that partially supply the PCAs. ANTERIOR CIRCULATION: --Intracranial internal carotid arteries: Atherosclerotic calcification of the internal carotid arteries at the skull base without hemodynamically significant stenosis. --Anterior cerebral arteries (ACA): Normal. Both A1 segments are present. Patent anterior communicating artery (a-comm). --Middle cerebral arteries (MCA): Normal. VENOUS SINUSES: As permitted by contrast timing, patent. ANATOMIC VARIANTS: None Review of the MIP images confirms the above findings. IMPRESSION: 1. No intracranial arterial occlusion or high-grade stenosis. 2. Approximately 50% stenosis of the proximal left internal carotid artery secondary to mixed density atherosclerosis. Aortic Atherosclerosis (ICD10-I70.0).  Electronically Signed: By: Deatra RobinsonKevin  Herman M.D. On: 01/16/2020 03:08   DG Chest 2 View  Result Date: 01/16/2020 CLINICAL DATA:  Code stroke EXAM: CHEST - 2 VIEW COMPARISON:  10/18/2018 FINDINGS: Lungs are clear.  No pleural effusion or pneumothorax. The heart is normal in size. Degenerative changes of the visualized thoracolumbar spine. Right shoulder arthroplasty. Degenerative changes with posttraumatic deformity of the left shoulder. IMPRESSION: No evidence of acute cardiopulmonary disease. Electronically Signed   By: Charline BillsSriyesh  Shenea Giacobbe M.D.   On: 01/16/2020 04:19   CT Code Stroke CTA Neck W/WO contrast  Addendum Date: 01/16/2020   ADDENDUM REPORT: 01/16/2020 09:13 ADDENDUM: Dr. Roda ShuttersXu called for clarification of an arterial stenosis reference in a subsequent brain MRI report. The patient's right PCA territory infarct is associated with a severe P2 segment stenosis with flow gap. There is also a flow reducing left P1 segment stenosis which is ameliorated by a posterior communicating artery. Electronically Signed   By: Marnee SpringJonathon  Watts M.D.   On: 01/16/2020 09:13   Result Date: 01/16/2020 CLINICAL DATA:  Facial droop EXAM: CT ANGIOGRAPHY HEAD AND NECK TECHNIQUE: Multidetector CT imaging of the head and neck was performed using the standard protocol during bolus administration of intravenous contrast. Multiplanar CT image reconstructions and MIPs were obtained to evaluate the vascular anatomy. Carotid stenosis measurements (when applicable) are obtained utilizing NASCET criteria, using the distal internal carotid diameter as the denominator. CONTRAST:  75mL OMNIPAQUE IOHEXOL 350 MG/ML SOLN COMPARISON:  None. FINDINGS: CTA NECK FINDINGS SKELETON: There is no bony spinal canal stenosis. No lytic or blastic lesion. OTHER NECK: Normal pharynx, larynx and major salivary glands. No cervical lymphadenopathy. Unremarkable thyroid gland. UPPER CHEST: No pneumothorax or pleural effusion. No nodules or masses. AORTIC ARCH:  There is mild calcific atherosclerosis of the aortic arch. There is no aneurysm, dissection or hemodynamically significant stenosis of the visualized portion of the aorta. Conventional 3 vessel aortic branching pattern. The visualized proximal subclavian arteries are widely patent. RIGHT CAROTID SYSTEM: No dissection, occlusion or aneurysm. Mild atherosclerotic calcification at the carotid bifurcation without hemodynamically significant stenosis. LEFT CAROTID SYSTEM: No dissection, occlusion or aneurysm. There is mixed density atherosclerosis extending into the proximal ICA, resulting in 50% stenosis. VERTEBRAL ARTERIES: Left dominant configuration. Both origins are clearly patent. There is no dissection, occlusion or flow-limiting stenosis to the skull base (V1-V3 segments). CTA HEAD FINDINGS POSTERIOR CIRCULATION: --Vertebral arteries: Normal V4 segments. --Inferior cerebellar arteries: Normal. --Basilar artery: Normal. --Superior cerebellar arteries: Normal. --Posterior cerebral arteries (PCA): Normal. There are bilateral posterior communicating arteries (p-comm) that partially supply the PCAs. ANTERIOR CIRCULATION: --Intracranial internal carotid arteries: Atherosclerotic calcification of the internal carotid arteries at the skull base without hemodynamically significant stenosis. --Anterior cerebral arteries (ACA): Normal. Both A1 segments are present. Patent anterior communicating artery (a-comm). --Middle cerebral arteries (MCA): Normal. VENOUS SINUSES: As permitted by contrast timing, patent. ANATOMIC VARIANTS: None Review of the MIP images confirms the above findings. IMPRESSION: 1. No intracranial arterial occlusion or high-grade stenosis. 2. Approximately 50% stenosis of the proximal left internal carotid artery secondary to mixed density atherosclerosis. Aortic Atherosclerosis (ICD10-I70.0). Electronically Signed: By: Deatra Robinson M.D. On: 01/16/2020 03:08   MR BRAIN WO CONTRAST  Result Date:  01/20/2020 CLINICAL DATA:  Stroke, follow-up. EXAM: MRI HEAD WITHOUT CONTRAST TECHNIQUE: Multiplanar, multiecho pulse sequences of the brain and surrounding structures were obtained without intravenous contrast. COMPARISON:  Brain MRI 01/16/2020 FINDINGS: A limited protocol brain MRI was performed at the ordering provider's request consisting of axial and coronal diffusion-weighted sequences as well as an axial SWI sequence. Not significantly changed in extent, there is predominantly cortical restricted diffusion consistent with early subacute infarction within the right PCA territory affecting the right temporal lobe (including medial right temporal lobe/hippocampus) and right occipital lobe. As before, there is likely mild involvement of the inferior right parietal lobe. Redemonstrated subcentimeter early subacute infarct within the left thalamus. Early subacute infarcts within the left greater than right posterior pontomedullary junction have also not significantly changed in extent. Unchanged mild petechial hemorrhage within portions of the right occipital lobe infarction territory. No interval acute infarct is identified elsewhere. IMPRESSION: No significant interval change in extent of an early subacute right PCA territory infarct predominantly affecting the right temporal and occipital lobes. Unchanged mild petechial hemorrhage within portions of the right occipital lobe infarction territory. Smaller early subacute infarcts within the left thalamus and left greater than right posterior pontomedullary junction have also not significantly changed in extent. Electronically Signed   By: Jackey Loge DO   On: 01/20/2020 17:19   MR BRAIN WO CONTRAST  Result Date: 01/16/2020 CLINICAL DATA:  Stroke.  Left facial droop and slurred speech EXAM: MRI HEAD WITHOUT CONTRAST TECHNIQUE: Multiplanar, multiecho pulse sequences of the brain and surrounding structures were obtained without intravenous contrast. COMPARISON:   Head CT 01/16/2020 FINDINGS: Brain: There is abnormal diffusion restriction within the right PCA territory, predominantly involving the cortex. There is a small focus of acute ischemia in the  left thalamus. There is also an acute infarct of the left-greater-than-right dorsal medulla oblongata. Diffuse confluent hyperintense T2-weighted signal within the periventricular, deep and juxtacortical white matter, most commonly due to chronic ischemic microangiopathy. There is generalized atrophy without lobar predilection. No chronic microhemorrhage. Normal midline structures. Vascular: In retrospect, the earlier CTA shows a short segment occlusion of the right P2 segment with distal reconstitution. Major flow voids are normal. Skull and upper cervical spine: Upper cervical degenerative changes. Sinuses/Orbits: Negative. Other: None. IMPRESSION: 1. Acute right PCA territory infarct, predominantly involving the temporal and occipital cortex. 2. Small acute infarct of the left thalamus. 3. Short segment occlusion of the right P2 segment with distal reconstitution demonstrated on earlier CTA. 4. Severe chronic ischemic microangiopathy. Electronically Signed   By: Deatra Robinson M.D.   On: 01/16/2020 06:49   IR GASTROSTOMY TUBE MOD SED  Result Date: 02/02/2020 INDICATION: History of stroke, now with dysphagia. Please perform percutaneous gastrostomy tube placement for enteric nutrition supplementation purposes. EXAM: PULL TROUGH GASTROSTOMY TUBE PLACEMENT COMPARISON:  CT abdomen pelvis-06/18/2026 MEDICATIONS: Ancef 2 gm IV; Antibiotics were administered within 1 hour of the procedure. Glucagon 1 mg IV CONTRAST:  30 mL of Omnipaque 300 administered into the gastric lumen. ANESTHESIA/SEDATION: Moderate (conscious) sedation was employed during this procedure. A total of Versed 1 mg and Fentanyl 25 mcg was administered intravenously. Moderate Sedation Time: 10 minutes. The patient's level of consciousness and vital signs were  monitored continuously by radiology nursing throughout the procedure under my direct supervision. FLUOROSCOPY TIME:  1 minute, 30 seconds (10 mGy) COMPLICATIONS: None immediate. PROCEDURE: Informed written consent was obtained from the patient's family following explanation of the procedure, risks, benefits and alternatives. A time out was performed prior to the initiation of the procedure. Ultrasound scanning was performed to demarcate the edge of the left lobe of the liver. Maximal barrier sterile technique utilized including caps, mask, sterile gowns, sterile gloves, large sterile drape, hand hygiene and Betadine prep. The left upper quadrant was sterilely prepped and draped. An oral gastric catheter was inserted into the stomach under fluoroscopy. The existing nasogastric feeding tube was removed. The left costal margin and air opacified transverse colon were identified and avoided. Air was injected into the stomach for insufflation and visualization under fluoroscopy. Under sterile conditions a 17 gauge trocar needle was utilized to access the stomach percutaneously beneath the left subcostal margin after the overlying soft tissues were anesthetized with 1% Lidocaine with epinephrine. Needle position was confirmed within the stomach with aspiration of air and injection of small amount of contrast. A single T tack was deployed for gastropexy. Over an Amplatz guide wire, a 9-French sheath was inserted into the stomach. A snare device was utilized to capture the oral gastric catheter. The snare device was pulled retrograde from the stomach up the esophagus and out the oropharynx. The 20-French pull-through gastrostomy was connected to the snare device and pulled antegrade through the oropharynx down the esophagus into the stomach and then through the percutaneous tract external to the patient. The gastrostomy was assembled externally. Contrast injection confirms position in the stomach. Several spot radiographic  images were obtained in various obliquities for documentation. The patient tolerated procedure well without immediate post procedural complication. FINDINGS: After successful fluoroscopic guided placement, the gastrostomy tube is appropriately positioned with internal disc against the ventral aspect of the gastric lumen. IMPRESSION: Successful fluoroscopic insertion of a 20-French pull-through gastrostomy tube. The gastrostomy may be used immediately for medication administration and in 24 hrs  for the initiation of feeds. Electronically Signed   By: Simonne Come M.D.   On: 02/02/2020 12:33   DG CHEST PORT 1 VIEW  Result Date: 01/20/2020 CLINICAL DATA:  Cough. EXAM: PORTABLE CHEST 1 VIEW COMPARISON:  01/16/2020. FINDINGS: Feeding tube noted with tip below left hemidiaphragm. Mediastinum and hilar structures normal. Low lung volumes. No focal infiltrate identified. No pleural effusion or pneumothorax. Heart size stable. Mitral annular calcification noted. Coronary stent noted. Carotid calcification noted. Postsurgical changes right shoulder. Stable deformity and severe degenerative changes left shoulder. IMPRESSION: 1.  Feeding tube noted with tip below left hemidiaphragm. 2.  Low lung volumes.  No focal infiltrate identified. 3. Coronary stent noted. Carotid vascular calcification consistent with atherosclerotic vascular disease noted. Electronically Signed   By: Maisie Fus  Register   On: 01/20/2020 10:16   DG Swallowing Func-Speech Pathology  Result Date: 01/17/2020 Objective Swallowing Evaluation: Type of Study: MBS-Modified Barium Swallow Study  Patient Details Name: Sydney Schultz MRN: [id number] Date of Birth: [birth year] Today's Date: 01/17/2020 Time: SLP Start Time (ACUTE ONLY): 1150 -SLP Stop Time (ACUTE ONLY): 1214 SLP Time Calculation (min) (ACUTE ONLY): 24 min Past Medical History: Past Medical History: Diagnosis Date . Arthritis  . Diabetes (HCC)  . Dyspnea   with exertion . GERD (gastroesophageal reflux  disease)  . Hypertension  . Incontinence  . Sleep apnea   uses cpap . Swelling of both lower extremities  . Wheezing  Past Surgical History: Past Surgical History: Procedure Laterality Date . ANKLE SURGERY   . CARDIAC CATHETERIZATION    coronary stent . CARPAL TUNNEL RELEASE Right  . CATARACT EXTRACTION W/PHACO Left 07/29/2017  Procedure: CATARACT EXTRACTION PHACO AND INTRAOCULAR LENS PLACEMENT (IOC);  Surgeon: Galen Manila, MD;  Location: [facility] ORS;  Service: Ophthalmology;  Laterality: Left;  [place] 00:38.9 AP% 12.0 CDE 4.68 Fluid Pack Lot # [id number] . CATARACT EXTRACTION W/PHACO Right 08/19/2017  Procedure: CATARACT EXTRACTION PHACO AND INTRAOCULAR LENS PLACEMENT (IOC);  Surgeon: Galen Manila, MD;  Location: [facility] ORS;  Service: Ophthalmology;  Laterality: Right;  [place] 00:35 AP% 16.0 CDE 5.69 Fluid pack lot # [id number] . CESAREAN SECTION    x 2 . CORONARY ANGIOPLASTY    STENT . FOOT SURGERY Left   ankle tendon surgery . JOINT REPLACEMENT Bilateral   TKR/ RIGHT SHOULDER . KNEE SURGERY Bilateral   total knee replacements . SHOULDER SURGERY Right   shoulder replacement . tumor removed    neck HPI: Pt is an 86 yo female presenting with AMS and L facial droop. MRI showed R lower pontine, R PCA, and L thalamic infarcts. PMH includes: DM, TIAs, GERD, HTN, OSA, wheezing, OA  Subjective: asking for food Assessment / Plan / Recommendation CHL IP CLINICAL IMPRESSIONS 01/17/2020 Clinical Impression Pt has a moderate oropahryngeal dysphagia with oral intake quite effortful across trials. She has very limited labial seal so that she is unable to suck from a straw, but she also has trouble sealing her lips around a cup or spoon. Anterior loss is significant with thin liquids, at times only getting a trace amount of liquid in her mouth. Oral holding is more prominent with purees, but her attempts at oral transit once initiated are sluggish and small. Her base of tongue retraction and pharyngeal squeeze are also reduced, limiting  full epiglottic inversion and resulting in vallecular residue. Residue also coats her tongue, with most of the residue collecting more posteriorly at the base of her tongue. Thin liquids do help to clear some of this residue,  but it takes her multiple bolus attempts until she can keep enough thin liquid in her mouth. Her airway protection is relatively intact though, with trace and transient penetration observed x1. Overall, her swallowing is not efficient to provide a consistent or adequate source of intake at this time. Would offer small, single sips of water with full supervision after oral care to try to facilitate use of swallowing musculature and provide moisture. SLP will f/u for additional therapeutic boluses with potential to initiate PO diet as efficiency improves.  SLP Visit Diagnosis Dysphagia, oropharyngeal phase (R13.12) Attention and concentration deficit following -- Frontal lobe and executive function deficit following -- Impact on safety and function Moderate aspiration risk   [facility] IP TREATMENT RECOMMENDATION 01/17/2020 Treatment Recommendations Therapy as outlined in treatment plan below   Prognosis 01/17/2020 Prognosis for Safe Diet Advancement Good Barriers to Reach Goals Cognitive deficits Barriers/Prognosis Comment -- [facility] IP DIET RECOMMENDATION 01/17/2020 SLP Diet Recommendations NPO;Other (Comment) Liquid Administration via Spoon;Cup Medication Administration Via alternative means Compensations -- Postural Changes --   [facility] IP OTHER RECOMMENDATIONS 01/17/2020 Recommended Consults -- Oral Care Recommendations Oral care QID Other Recommendations Have oral suction available   [facility] IP FOLLOW UP RECOMMENDATIONS 01/17/2020 Follow up Recommendations Skilled Nursing facility   [facility] IP FREQUENCY AND DURATION 01/17/2020 Speech Therapy Frequency (ACUTE ONLY) min 2x/week Treatment Duration 2 weeks      [facility] IP ORAL PHASE 01/17/2020 Oral Phase Impaired Oral - Pudding Teaspoon -- Oral - Pudding Cup -- Oral - Honey Teaspoon  -- Oral - Honey Cup -- Oral - Nectar Teaspoon -- Oral - Nectar Cup -- Oral - Nectar Straw -- Oral - Thin Teaspoon Weak lingual manipulation;Lingual pumping;Reduced posterior propulsion;Holding of bolus;Lingual/palatal residue;Delayed oral transit;Decreased bolus cohesion;Premature spillage;Left anterior bolus loss Oral - Thin Cup Weak lingual manipulation;Lingual pumping;Reduced posterior propulsion;Holding of bolus;Lingual/palatal residue;Delayed oral transit;Decreased bolus cohesion;Premature spillage;Left anterior bolus loss Oral - Thin Straw -- Oral - Puree Weak lingual manipulation;Lingual pumping;Reduced posterior propulsion;Holding of bolus;Lingual/palatal residue;Delayed oral transit;Decreased bolus cohesion;Premature spillage Oral - Mech Soft -- Oral - Regular -- Oral - Multi-Consistency -- Oral - Pill -- Oral Phase - Comment --  CHL IP PHARYNGEAL PHASE 01/17/2020 Pharyngeal Phase Impaired Pharyngeal- Pudding Teaspoon -- Pharyngeal -- Pharyngeal- Pudding Cup -- Pharyngeal -- Pharyngeal- Honey Teaspoon -- Pharyngeal -- Pharyngeal- Honey Cup -- Pharyngeal -- Pharyngeal- Nectar Teaspoon -- Pharyngeal -- Pharyngeal- Nectar Cup -- Pharyngeal -- Pharyngeal- Nectar Straw -- Pharyngeal -- Pharyngeal- Thin Teaspoon Reduced tongue base retraction;Reduced pharyngeal peristalsis;Pharyngeal residue - valleculae;Reduced epiglottic inversion Pharyngeal -- Pharyngeal- Thin Cup Reduced tongue base retraction;Reduced pharyngeal peristalsis;Pharyngeal residue - valleculae;Penetration/Aspiration before swallow;Reduced epiglottic inversion Pharyngeal Material enters airway, remains ABOVE vocal cords then ejected out Pharyngeal- Thin Straw -- Pharyngeal -- Pharyngeal- Puree Reduced tongue base retraction;Reduced pharyngeal peristalsis;Pharyngeal residue - valleculae;Reduced epiglottic inversion;Delayed swallow initiation-vallecula Pharyngeal -- Pharyngeal- Mechanical Soft -- Pharyngeal -- Pharyngeal- Regular -- Pharyngeal --  Pharyngeal- Multi-consistency -- Pharyngeal -- Pharyngeal- Pill -- Pharyngeal -- Pharyngeal Comment --  CHL IP CERVICAL ESOPHAGEAL PHASE 01/17/2020 Cervical Esophageal Phase WFL Pudding Teaspoon -- Pudding Cup -- Honey Teaspoon -- Honey Cup -- Nectar Teaspoon -- Nectar Cup -- Nectar Straw -- Thin Teaspoon -- Thin Cup -- Thin Straw -- Puree -- Mechanical Soft -- Regular -- Multi-consistency -- Pill -- Cervical Esophageal Comment -- Mahala Menghini., M.A. CCC-SLP Acute Rehabilitation Services Pager [phone] Office [phone] 01/17/2020, 1:46 PM              ECHOCARDIOGRAM COMPLETE  Result Date: 01/17/2020  ECHOCARDIOGRAM REPORT   Patient Name:   Sydney Schultz Date of Exam: 01/17/2020 Medical Rec #:  [id number]       Height:       62.0 in Accession #:    [id number]      Weight:       118.6 lb Date of Birth:  [birth year]      BSA:          1.531 m Patient Age:    87 years        BP:           134/62 mmHg Patient Gender: F               HR:           72 bpm. Exam Location:  Inpatient Procedure: 2D Echo Indications:    Stroke I163.9  History:        Patient has prior history of Echocardiogram examinations. Risk                 Factors:Hypertension and Diabetes.  Sonographer:    Thurman Coyer RDCS (AE) Referring Phys: [id number] MICHAEL E NORINS IMPRESSIONS  1. Left ventricular ejection fraction, by estimation, is 60 to 65%. The left ventricle has normal function. The left ventricle has no regional wall motion abnormalities. Left ventricular diastolic parameters are consistent with Grade II diastolic dysfunction (pseudonormalization). Elevated left ventricular end-diastolic pressure.  2. Right ventricular systolic function is normal. The right ventricular size is normal. There is moderately elevated pulmonary artery systolic pressure. The estimated right ventricular systolic pressure is 60.8 mmHg.  3. Left atrial size was severely dilated.  4. The mitral valve is normal in structure. Mild mitral valve regurgitation. Moderate  mitral stenosis. The mean mitral valve gradient is 8.0 mmHg.  5. Tricuspid valve regurgitation is moderate.  6. The aortic valve is tricuspid. Aortic valve regurgitation is not visualized. Moderate aortic valve stenosis. Aortic valve mean gradient measures 31.0 mmHg. Aortic valve Vmax measures 3.56 m/s.  7. The inferior vena cava is normal in size with greater than 50% respiratory variability, suggesting right atrial pressure of 3 mmHg. FINDINGS  Left Ventricle: Left ventricular ejection fraction, by estimation, is 60 to 65%. The left ventricle has normal function. The left ventricle has no regional wall motion abnormalities. The left ventricular internal cavity size was normal in size. There is  no left ventricular hypertrophy. Left ventricular diastolic parameters are consistent with Grade II diastolic dysfunction (pseudonormalization). Elevated left ventricular end-diastolic pressure. Right Ventricle: The right ventricular size is normal. No increase in right ventricular wall thickness. Right ventricular systolic function is normal. There is moderately elevated pulmonary artery systolic pressure. The tricuspid regurgitant velocity is 3.80 m/s, and with an assumed right atrial pressure of 3 mmHg, the estimated right ventricular systolic pressure is 60.8 mmHg. Left Atrium: Left atrial size was severely dilated. Right Atrium: Right atrial size was normal in size. Pericardium: There is no evidence of pericardial effusion. Mitral Valve: The mitral valve is normal in structure. There is moderate thickening of the mitral valve leaflet(s). Normal mobility of the mitral valve leaflets. Severe mitral annular calcification. Mild mitral valve regurgitation. Moderate mitral valve stenosis. MV peak gradient, 17.1 mmHg. The mean mitral valve gradient is 8.0 mmHg. Tricuspid Valve: The tricuspid valve is normal in structure. Tricuspid valve regurgitation is moderate . No evidence of tricuspid stenosis. Aortic Valve: The aortic  valve is tricuspid. . There is severe thickening of the aortic valve. Aortic valve regurgitation  is not visualized. Moderate aortic stenosis is present. Severe aortic valve annular calcification. There is severe thickening of the  aortic valve. Aortic valve mean gradient measures 31.0 mmHg. Aortic valve peak gradient measures 50.7 mmHg. Aortic valve area, by VTI measures 0.69 cm. Pulmonic Valve: The pulmonic valve was normal in structure. Pulmonic valve regurgitation is not visualized. No evidence of pulmonic stenosis. Aorta: The aortic root is normal in size and structure. Venous: The inferior vena cava is normal in size with greater than 50% respiratory variability, suggesting right atrial pressure of 3 mmHg. IAS/Shunts: No atrial level shunt detected by color flow Doppler.  LEFT VENTRICLE PLAX 2D LVIDd:         3.90 cm  Diastology LVIDs:         2.30 cm  LV e' lateral:   4.26 cm/s LV PW:         0.90 cm  LV E/e' lateral: 44.1 LV IVS:        1.00 cm  LV e' medial:    3.30 cm/s LVOT diam:     1.80 cm  LV E/e' medial:  57.0 LV SV:         58 LV SV Index:   38 LVOT Area:     2.54 cm  RIGHT VENTRICLE RV S prime:     12.30 cm/s TAPSE (M-mode): 2.3 cm LEFT ATRIUM             Index       RIGHT ATRIUM           Index LA diam:        3.70 cm 2.42 cm/m  RA Area:     11.80 cm LA Vol (A2C):   82.4 ml 53.82 ml/m RA Volume:   22.40 ml  14.63 ml/m LA Vol (A4C):   75.0 ml 48.99 ml/m LA Biplane Vol: 78.7 ml 51.40 ml/m  AORTIC VALVE AV Area (Vmax):    0.61 cm AV Area (Vmean):   0.54 cm AV Area (VTI):     0.69 cm AV Vmax:           356.00 cm/s AV Vmean:          263.000 cm/s AV VTI:            0.839 m AV Peak Grad:      50.7 mmHg AV Mean Grad:      31.0 mmHg LVOT Vmax:         85.00 cm/s LVOT Vmean:        55.300 cm/s LVOT VTI:          0.229 m LVOT/AV VTI ratio: 0.27  AORTA Ao Root diam: 2.90 cm MITRAL VALVE                TRICUSPID VALVE MV Area (PHT): 2.39 cm     TR Peak grad:   57.8 mmHg MV Peak grad:  17.1 mmHg     TR Vmax:        380.00 cm/s MV Mean grad:  8.0 mmHg MV Vmax:       2.07 m/s     SHUNTS MV Vmean:      130.0 cm/s   Systemic VTI:  0.23 m MV Decel Time: 317 msec     Systemic Diam: 1.80 cm MV E velocity: 188.00 cm/s MV A velocity: 126.00 cm/s MV E/A ratio:  1.49 Armanda Magic MD Electronically signed by Armanda Magic MD Signature Date/Time: 01/17/2020/12:06:26 PM    Final  CT HEAD CODE STROKE WO CONTRAST  Result Date: 01/16/2020 CLINICAL DATA:  Code stroke.  Facial droop EXAM: CT HEAD WITHOUT CONTRAST TECHNIQUE: Contiguous axial images were obtained from the base of the skull through the vertex without intravenous contrast. COMPARISON:  10/18/2018 FINDINGS: Brain: There is no mass, hemorrhage or extra-axial collection. There is generalized atrophy without lobar predilection. There is hypoattenuation of the periventricular white matter, most commonly indicating chronic ischemic microangiopathy. Vascular: Atherosclerotic calcification of the internal carotid arteries at the skull base. No abnormal hyperdensity of the major intracranial arteries or dural venous sinuses. Skull: The visualized skull base, calvarium and extracranial soft tissues are normal. Sinuses/Orbits: No fluid levels or advanced mucosal thickening of the visualized paranasal sinuses. No mastoid or middle ear effusion. The orbits are normal. ASPECTS [facility] Stroke Program Early CT Score) - Ganglionic level infarction (caudate, lentiform nuclei, internal capsule, insula, M1-M3 cortex): 7 - Supraganglionic infarction (M4-M6 cortex): 3 Total score (0-10 with 10 being normal): 10 IMPRESSION: 1. No acute intracranial abnormality. 2. ASPECTS is 10. These results were communicated to Dr. Ritta Slot at 2:35 am on 01/16/2020 by text page via the [facility] messaging system. Electronically Signed   By: Deatra Robinson M.D.   On: 01/16/2020 02:35   VAS [place] LOWER EXTREMITY VENOUS (DVT)  Result Date: 01/18/2020  Lower Venous DVTStudy Indications: Stroke.   Limitations: Altered mental status/poor cooperation. Comparison Study: Prior negative study done 11/23/16 is available for comparison Performing Technologist: Sherren Kerns RVS  Examination Guidelines: A complete evaluation includes B-mode imaging, spectral Doppler, color Doppler, and power Doppler as needed of all accessible portions of each vessel. Bilateral testing is considered an integral part of a complete examination. Limited examinations for reoccurring indications may be performed as noted. The reflux portion of the exam is performed with the patient in reverse Trendelenburg.  +---------+---------------+---------+-----------+----------+--------------+ RIGHT    CompressibilityPhasicitySpontaneityPropertiesThrombus Aging +---------+---------------+---------+-----------+----------+--------------+ CFV      Full           Yes      Yes                                 +---------+---------------+---------+-----------+----------+--------------+ SFJ      Full                                                        +---------+---------------+---------+-----------+----------+--------------+ FV Prox  Full                                                        +---------+---------------+---------+-----------+----------+--------------+ FV Mid   Full                                                        +---------+---------------+---------+-----------+----------+--------------+ FV DistalFull                                                        +---------+---------------+---------+-----------+----------+--------------+  PFV      Full                                                        +---------+---------------+---------+-----------+----------+--------------+ POP      Full           Yes      Yes                                 +---------+---------------+---------+-----------+----------+--------------+ PTV      Full                                                         +---------+---------------+---------+-----------+----------+--------------+ PERO     Full                                                        +---------+---------------+---------+-----------+----------+--------------+   +---------+---------------+---------+-----------+----------+-------------------+ LEFT     CompressibilityPhasicitySpontaneityPropertiesThrombus Aging      +---------+---------------+---------+-----------+----------+-------------------+ CFV      Full           Yes      Yes                                      +---------+---------------+---------+-----------+----------+-------------------+ SFJ      Full                                                             +---------+---------------+---------+-----------+----------+-------------------+ FV Prox  Full                                                             +---------+---------------+---------+-----------+----------+-------------------+ FV Mid   Full                                                             +---------+---------------+---------+-----------+----------+-------------------+ FV DistalFull                                                             +---------+---------------+---------+-----------+----------+-------------------+ PFV      Full                                                             +---------+---------------+---------+-----------+----------+-------------------+  POP                                                   patent by color and                                                       Doppler             +---------+---------------+---------+-----------+----------+-------------------+ PTV      Full                                                             +---------+---------------+---------+-----------+----------+-------------------+ PERO     Full                                                              +---------+---------------+---------+-----------+----------+-------------------+     Summary: RIGHT: - Findings appear essentially unchanged compared to previous examination. - There is no evidence of deep vein thrombosis in the lower extremity.  LEFT: - Findings appear essentially unchanged compared to previous examination. - There is no evidence of deep vein thrombosis in the lower extremity. However, portions of this examination were limited- see technologist comments above.  *See table(s) above for measurements and observations. Electronically signed by Waverly Ferrari MD on 01/18/2020 at 4:09:45 PM.    Final     DISCHARGE EXAMINATION: Vitals:   02/02/20 2315 02/03/20 0300 02/03/20 0306 02/03/20 0700  BP: (!) 115/55  136/67 134/65  Pulse: 91  89 84  Resp: (!) 21  17 17   Temp: 99.1 F (37.3 C)  98.1 F (36.7 C) 97.6 F (36.4 C)  TempSrc: Oral  Oral Axillary  SpO2: 98%  97% 100%  Weight:  80 kg    Height:       General appearance: Awake alert.  In no distress.  Pleasantly confused Resp: Clear to auscultation bilaterally.  Normal effort Cardio: S1-S2 is normal regular.  No S3-S4.  No rubs murmurs or bruit GI: Abdomen is soft.  Nontender nondistended.  Bowel sounds are present normal.  No masses organomegaly    DISPOSITION: SNF  Discharge Instructions    Ambulatory referral to Neurology   Complete by: As directed    Follow up in 4 weeks. Thanks.   Call MD for:  difficulty breathing, headache or visual disturbances   Complete by: As directed    Call MD for:  extreme fatigue   Complete by: As directed    Call MD for:  persistant dizziness or light-headedness   Complete by: As directed    Call MD for:  persistant nausea and vomiting   Complete by: As directed    Call MD for:  severe uncontrolled pain   Complete by: As directed    Call MD for:  temperature >100.4   Complete by:  As directed    Discharge instructions   Complete by: As directed    Please review instructions on  the discharge summary.  You were cared for by a hospitalist during your hospital stay. If you have any questions about your discharge medications or the care you received while you were in the hospital after you are discharged, you can call the unit and asked to speak with the hospitalist on call if the hospitalist that took care of you is not available. Once you are discharged, your primary care physician will handle any further medical issues. Please note that NO REFILLS for any discharge medications will be authorized once you are discharged, as it is imperative that you return to your primary care physician (or establish a relationship with a primary care physician if you do not have one) for your aftercare needs so that they can reassess your need for medications and monitor your lab values. If you do not have a primary care physician, you can call [phone] for a physician referral.   Discharge wound care:   Complete by: As directed    Wound care  Daily      Comments: Apply Santyl to sacrum wound Q day, then cover with moist 2X2 and foam dressing.  (Change foam dressing Q 3 days or PRN soiling.)  Wound care  Every shift      Comments: Wound care to bilateral heels (intact, boggy):  Wash with soap and water, rinse and pat dry. Paint with betadine swabstick and allow to air-dry. Place feet into [employer].   Increase activity slowly   Complete by: As directed         Allergies as of 02/03/2020   No Known Allergies     Medication List    STOP taking these medications   acetaminophen 650 MG CR tablet Commonly known as: TYLENOL Replaced by: acetaminophen 160 MG/5ML solution   calcium-vitamin D 500-200 MG-UNIT tablet Commonly known as: OSCAL WITH D   [employer] Iron 240 (27 FE) MG tablet Generic drug: ferrous gluconate   empagliflozin 10 MG Tabs tablet Commonly known as: JARDIANCE   ferrous sulfate 325 (65 FE) MG tablet   hydrochlorothiazide 25 MG tablet Commonly known as:  HYDRODIURIL   pantoprazole 40 MG tablet Commonly known as: PROTONIX Replaced by: pantoprazole sodium 40 mg/20 mL Pack   Potassium Gluconate 550 MG Tabs   vitamin E 180 MG (400 UNITS) capsule     TAKE these medications   acetaminophen 160 MG/5ML solution Commonly known as: TYLENOL Place 20.3 mLs (650 mg total) into feeding tube 2 (two) times daily. Replaces: acetaminophen 650 MG CR tablet   acetaminophen 325 MG tablet Commonly known as: TYLENOL Place 2 tablets (650 mg total) into feeding tube every 4 (four) hours as needed for mild pain (or temp > 37.5 C (99.5 F)).   artificial tears Oint ophthalmic ointment Commonly known as: LACRILUBE Place into the left eye at bedtime.   aspirin 325 MG tablet Place 1 tablet (325 mg total) into feeding tube daily. FOR 3 MONTHS ONLY Start taking on: February 04, 2020   clopidogrel 75 MG tablet Commonly known as: PLAVIX Place 1 tablet (75 mg total) into feeding tube every evening. What changed: how to take this   colestipol 1 g tablet Commonly known as: COLESTID Place 2 tablets (2 g total) into feeding tube every evening. What changed:   how to take this  when to take this   docusate 50 MG/5ML liquid Commonly  known as: COLACE Place 5 mLs (50 mg total) into feeding tube at bedtime as needed for mild constipation.   donepezil 5 MG tablet Commonly known as: ARICEPT Place 1 tablet (5 mg total) into feeding tube daily. What changed: how to take this   ezetimibe 10 MG tablet Commonly known as: ZETIA Place 1 tablet (10 mg total) into feeding tube daily at 6 PM.   feeding supplement (OSMOLITE 1.2 CAL) Liqd Place 1,000 mLs into feeding tube continuous.   free water Soln Place 100 mLs into feeding tube every 6 (six) hours.   irbesartan 75 MG tablet Commonly known as: AVAPRO Place 1 tablet (75 mg total) into feeding tube daily. Start taking on: February 04, 2020 What changed:   medication strength  how much to take  how  to take this   oxybutynin 5 MG tablet Commonly known as: DITROPAN Place 1 tablet (5 mg total) into feeding tube 2 (two) times daily. What changed: how to take this   pantoprazole sodium 40 mg/20 mL Pack Commonly known as: PROTONIX Place 20 mLs (40 mg total) into feeding tube daily. Start taking on: February 04, 2020 Replaces: pantoprazole 40 MG tablet   polyvinyl alcohol 1.4 % ophthalmic solution Commonly known as: LIQUIFILM TEARS Place 1 drop into the left eye 4 (four) times daily.   sennosides 8.8 MG/5ML syrup Commonly known as: SENOKOT Place 5 mLs into feeding tube at bedtime as needed for mild constipation.   simvastatin 40 MG tablet Commonly known as: ZOCOR Place 1 tablet (40 mg total) into feeding tube daily at 6 PM.   topiramate 50 MG tablet Commonly known as: TOPAMAX Place 1 tablet (50 mg total) into feeding tube daily. What changed: how to take this            Discharge Care Instructions  (From admission, onward)         Start     Ordered   02/03/20 0000  Discharge wound care:       Comments: Wound care  Daily      Comments: Apply Santyl to sacrum wound Q day, then cover with moist 2X2 and foam dressing.  (Change foam dressing Q 3 days or PRN soiling.)  Wound care  Every shift      Comments: Wound care to bilateral heels (intact, boggy):  Wash with soap and water, rinse and pat dry. Paint with betadine swabstick and allow to air-dry. Place feet into [employer].   02/03/20 1048            Follow-up Information    Naoma Diener, NP. Schedule an appointment as soon as possible for a visit in 4 week(s).   Specialty: Neurology Contact information: [address] [place] [address] [phone]               TOTAL DISCHARGE TIME: 35 minutes  Ethne Jeon Rito Ehrlich  [facility] Pager on [email or web address]  02/03/2020, 10:48 AM

## 2020-02-03 NOTE — Progress Notes (Signed)
Referring Physician(s): Osvaldo Shipper  Supervising Physician: Richarda Overlie  Patient Status:  [facility] - In-pt  Chief Complaint: Follow up 20 Fr pull-through gastrostomy placed 9/22 in IR.  Subjective:  Patient laying in bed listening to gospel music, daughter at bedside who reports just finished breakfast. Ms. Pfeifer denies any complaints today besides some soreness at the g-tube site.   Allergies: Patient has no known allergies.  Medications: Prior to Admission medications   Medication Sig Start Date End Date Taking? Authorizing Provider  acetaminophen (TYLENOL) 650 MG CR tablet Take 650 mg by mouth 2 (two) times daily.   Yes [provider]  clopidogrel (PLAVIX) 75 MG tablet Take 75 mg by mouth every evening.    Yes [provider]  empagliflozin (JARDIANCE) 10 MG TABS tablet Take 10 mg by mouth daily.   Yes [provider]  ferrous gluconate ([facility] IRON) 240 (27 FE) MG tablet Take 240 mg by mouth daily.   Yes [provider]  ferrous sulfate 325 (65 FE) MG tablet Take 325 mg by mouth daily with breakfast.   Yes [provider]  irbesartan (AVAPRO) 300 MG tablet Take 150 mg by mouth daily.  06/04/18  Yes [provider]  oxybutynin (DITROPAN) 5 MG tablet Take 5 mg by mouth 2 (two) times daily.   Yes [provider]  pantoprazole (PROTONIX) 40 MG tablet Take 40 mg by mouth daily.   Yes [provider]  Potassium Gluconate 550 MG TABS Take 1 tablet by mouth daily. 595 mg   Yes [provider]  vitamin E 400 UNIT capsule Take 400 Units by mouth daily.   Yes [provider]  acetaminophen (TYLENOL) 160 MG/5ML solution Place 20.3 mLs (650 mg total) into feeding tube 2 (two) times daily. 02/03/20   Osvaldo Shipper, MD  acetaminophen (TYLENOL) 325 MG tablet Place 2 tablets (650 mg total) into feeding tube every 4 (four) hours as needed for mild pain (or temp > 37.5 C (99.5 F)). 02/03/20   Osvaldo Shipper,  MD  artificial tears (LACRILUBE) OINT ophthalmic ointment Place into the left eye at bedtime. 02/03/20   Osvaldo Shipper, MD  aspirin 325 MG tablet Place 1 tablet (325 mg total) into feeding tube daily. FOR 3 MONTHS ONLY 02/04/20 05/04/20  Osvaldo Shipper, MD  calcium-vitamin D (OSCAL WITH D) 500-200 MG-UNIT tablet Take 1 tablet by mouth daily with breakfast. Patient not taking: Reported on 01/16/2020    [provider]  clopidogrel (PLAVIX) 75 MG tablet Place 1 tablet (75 mg total) into feeding tube every evening. 02/03/20   Osvaldo Shipper, MD  colestipol (COLESTID) 1 g tablet Place 2 tablets (2 g total) into feeding tube every evening. 02/03/20   Osvaldo Shipper, MD  docusate (COLACE) 50 MG/5ML liquid Place 5 mLs (50 mg total) into feeding tube at bedtime as needed for mild constipation. 02/03/20   Osvaldo Shipper, MD  donepezil (ARICEPT) 5 MG tablet Place 1 tablet (5 mg total) into feeding tube daily. 02/03/20   Osvaldo Shipper, MD  ezetimibe (ZETIA) 10 MG tablet Place 1 tablet (10 mg total) into feeding tube daily at 6 PM. 02/03/20   Osvaldo Shipper, MD  hydrochlorothiazide (HYDRODIURIL) 25 MG tablet Take 25 mg by mouth daily. 10/07/19   [provider]  irbesartan (AVAPRO) 75 MG tablet Place 1 tablet (75 mg total) into feeding tube daily. 02/04/20   Osvaldo Shipper, MD  Nutritional Supplements (FEEDING SUPPLEMENT, OSMOLITE 1.2 CAL,) LIQD Place 1,000 mLs into  feeding tube continuous. 02/03/20   Osvaldo Shipper, MD  oxybutynin (DITROPAN) 5 MG tablet Place 1 tablet (5 mg total) into feeding tube 2 (two) times daily. 02/03/20   Osvaldo Shipper, MD  pantoprazole sodium (PROTONIX) 40 mg/20 mL PACK Place 20 mLs (40 mg total) into feeding tube daily. 02/04/20   Osvaldo Shipper, MD  polyvinyl alcohol (LIQUIFILM TEARS) 1.4 % ophthalmic solution Place 1 drop into the left eye 4 (four) times daily. 02/03/20   Osvaldo Shipper, MD  sennosides (SENOKOT) 8.8 MG/5ML syrup Place 5 mLs into feeding tube at  bedtime as needed for mild constipation. 02/03/20   Osvaldo Shipper, MD  simvastatin (ZOCOR) 40 MG tablet Place 1 tablet (40 mg total) into feeding tube daily at 6 PM. 02/03/20   Osvaldo Shipper, MD  topiramate (TOPAMAX) 50 MG tablet Place 1 tablet (50 mg total) into feeding tube daily. 02/03/20   Osvaldo Shipper, MD  Water For Irrigation, Sterile (FREE WATER) SOLN Place 100 mLs into feeding tube every 6 (six) hours. 02/03/20   Osvaldo Shipper, MD     Vital Signs: BP 134/65 (BP Location: Right Arm)   Pulse 84   Temp 97.6 F (36.4 C) (Axillary)   Resp 17   Ht 5\' 2"  (1.575 m)   Wt 176 lb 5.9 oz (80 kg)   SpO2 100%   BMI 32.26 kg/m   Physical Exam Vitals and nursing note reviewed.  Constitutional:      General: She is not in acute distress. HENT:     Head: Normocephalic.  Cardiovascular:     Rate and Rhythm: Normal rate.  Pulmonary:     Effort: Pulmonary effort is normal.  Abdominal:     General: There is no distension.     Palpations: Abdomen is soft.     Tenderness: There is abdominal tenderness (minimal TTP over g-tube insertion site).  Skin:    General: Skin is warm and dry.  Neurological:     Mental Status: She is alert. Mental status is at baseline.     Imaging: IR GASTROSTOMY TUBE MOD SED  Result Date: 02/02/2020 INDICATION: History of stroke, now with dysphagia. Please perform percutaneous gastrostomy tube placement for enteric nutrition supplementation purposes. EXAM: PULL TROUGH GASTROSTOMY TUBE PLACEMENT COMPARISON:  CT abdomen pelvis-06/18/2026 MEDICATIONS: Ancef 2 gm IV; Antibiotics were administered within 1 hour of the procedure. Glucagon 1 mg IV CONTRAST:  30 mL of Omnipaque 300 administered into the gastric lumen. ANESTHESIA/SEDATION: Moderate (conscious) sedation was employed during this procedure. A total of Versed 1 mg and Fentanyl 25 mcg was administered intravenously. Moderate Sedation Time: 10 minutes. The patient's level of consciousness and vital signs  were monitored continuously by radiology nursing throughout the procedure under my direct supervision. FLUOROSCOPY TIME:  1 minute, 30 seconds (10 mGy) COMPLICATIONS: None immediate. PROCEDURE: Informed written consent was obtained from the patient's family following explanation of the procedure, risks, benefits and alternatives. A time out was performed prior to the initiation of the procedure. Ultrasound scanning was performed to demarcate the edge of the left lobe of the liver. Maximal barrier sterile technique utilized including caps, mask, sterile gowns, sterile gloves, large sterile drape, hand hygiene and Betadine prep. The left upper quadrant was sterilely prepped and draped. An oral gastric catheter was inserted into the stomach under fluoroscopy. The existing nasogastric feeding tube was removed. The left costal margin and air opacified transverse colon were identified and avoided. Air was injected into the stomach for insufflation and visualization under fluoroscopy. Under  sterile conditions a 17 gauge trocar needle was utilized to access the stomach percutaneously beneath the left subcostal margin after the overlying soft tissues were anesthetized with 1% Lidocaine with epinephrine. Needle position was confirmed within the stomach with aspiration of air and injection of small amount of contrast. A single T tack was deployed for gastropexy. Over an Amplatz guide wire, a 9-French sheath was inserted into the stomach. A snare device was utilized to capture the oral gastric catheter. The snare device was pulled retrograde from the stomach up the esophagus and out the oropharynx. The 20-French pull-through gastrostomy was connected to the snare device and pulled antegrade through the oropharynx down the esophagus into the stomach and then through the percutaneous tract external to the patient. The gastrostomy was assembled externally. Contrast injection confirms position in the stomach. Several spot  radiographic images were obtained in various obliquities for documentation. The patient tolerated procedure well without immediate post procedural complication. FINDINGS: After successful fluoroscopic guided placement, the gastrostomy tube is appropriately positioned with internal disc against the ventral aspect of the gastric lumen. IMPRESSION: Successful fluoroscopic insertion of a 20-French pull-through gastrostomy tube. The gastrostomy may be used immediately for medication administration and in 24 hrs for the initiation of feeds. Electronically Signed   By: Simonne Come M.D.   On: 02/02/2020 12:33    Labs:  CBC: Recent Labs    01/26/20 0207 01/29/20 0136 02/01/20 0400 02/03/20 0402  WBC 3.4* 4.0 4.8 7.4  HGB 10.9* 9.8* 9.9* 10.1*  HCT 34.8* 31.5* 31.6* 31.6*  PLT 277 298 268 340    COAGS: Recent Labs    01/16/20 0220 02/01/20 0400  INR 1.1 1.0  APTT 30  --     BMP: Recent Labs    01/26/20 0207 01/29/20 0136 02/01/20 0400 02/03/20 0402  NA 140 141 142 140  K 4.4 4.5 4.2 3.9  CL 108 107 107 108  CO2 26 27 26 24   GLUCOSE 121* 127* 120* 87  BUN 18 20 20 16   CALCIUM 9.7 9.6 9.7 9.6  CREATININE 0.80 0.72 0.73 0.72  GFRNONAA >60 >60 >60 >60  GFRAA >60 >60 >60 >60    LIVER FUNCTION TESTS: Recent Labs    01/24/20 0322 01/25/20 0358 01/26/20 0207 02/03/20 0402  BILITOT 0.3 0.4 0.4 0.7  AST 24 28 37 25  ALT 19 20 24 14   ALKPHOS 71 70 78 92  PROT 4.8* 4.9* 4.9* 5.2*  ALBUMIN 2.4* 2.4* 2.5* 2.6*    Assessment and Plan:  86 y/o F s/p pull-through gastrostomy placement 02/02/20 in IR seen today for site check prior to initiation of tube feeding.  Insertion site is appropriately tender to palpation but otherwise unremarkable. May begin to use for medications, free water, tube feedings, etc immediately.  Please call IR with any questions or concerns.  Electronically Signed: , PA-C 02/03/2020, 10:55 AM   I spent a total of 15 Minutes at the  the patient's bedside AND on the patient's hospital floor or unit, greater than 50% of which was counseling/coordinating care for g tube site check.

## 2020-02-03 NOTE — TOC Progression Note (Signed)
Transition of Care [facility]) - Progression Note    Patient Details  Name: Sydney Schultz MRN: [id number] Date of Birth: [birth year]  [facility]) CM/SW Contact  Baldemar Lenis, [place] Phone Number: 02/03/2020, 10:54 AM  Clinical Narrative:   CSW following for discharge to SNF. Peg placed yesterday, tube feeds initiated today, hopeful for DC tomorrow if feeds tolerated. CSW updated [facility], bed is still available. CSW contacted [facility] to update auth dates. CSW to follow.    Expected Discharge Plan: Skilled Nursing Facility Barriers to Discharge: Continued Medical Work up  Expected Discharge Plan and Services Expected Discharge Plan: Skilled Nursing Facility       Living arrangements for the past 2 months: Single Family Home                                       Social Determinants of Health (SDOH) Interventions    Readmission Risk Interventions No flowsheet data found.

## 2020-02-03 NOTE — Progress Notes (Signed)
Physical Therapy Treatment Patient Details Name: Sydney Schultz MRN: [id number] DOB: 22-Aug-1931 Today's Date: 02/03/2020    History of Present Illness Pt is a 86 y/o female with PMH of DM, HTN, OA; presenting with AMS and progressive decline in ADLS, mobility then L facial droop/slurred speech 9/4. MRI reveals acute lower pontine, R PCA territory infarct, L thalamic infarcts- embolic pattern.     PT Comments    Pt is up to side of bed with PT assist, daughter of pt in attendance.  Pt is motivated to let PT help because daughter is encouraging her, and is able to pivot max assist to the chair.  PT recommending nursing use two person help.  Pt is repositioned to keep her upright and used pillows to support L side and general trunk weakness.  Follow acutely to progress to gait as she continues to recover from her g tube insertion yesterday.   Follow Up Recommendations  SNF;Supervision/Assistance - 24 hour;Other (comment)     Equipment Recommendations  None recommended by PT    Recommendations for Other Services       Precautions / Restrictions Precautions Precautions: Fall Precaution Comments: monitor vitals Restrictions Weight Bearing Restrictions: No  NEW G tube with binder to protect   Mobility  Bed Mobility Overal bed mobility: Needs Assistance Bed Mobility: Rolling;Supine to Sit Rolling: Max assist   Supine to sit: Total assist        Transfers Overall transfer level: Needs assistance Equipment used: 1 person hand held assist Transfers: Sit to/from [employer] Transfers Sit to Stand: Max assist Stand pivot transfers: Max assist       General transfer comment: two person help preferred for nursing staff  Ambulation/Gait                 Stairs             Wheelchair Mobility    Modified Rankin (Stroke Patients Only)       Balance Overall balance assessment: Needs assistance Sitting-balance support: Feet supported;Bilateral upper  extremity supported Sitting balance-Leahy Scale: Zero                                      Cognition Arousal/Alertness: Awake/alert Behavior During Therapy: Flat affect Overall Cognitive Status: Impaired/Different from baseline Area of Impairment: Attention;Following commands;Safety/judgement;Problem solving                 Orientation Level: Situation;Time Current Attention Level: Selective Memory: Decreased recall of precautions;Decreased short-term memory Following Commands: Follows one step commands inconsistently;Follows one step commands with increased time Safety/Judgement: Decreased awareness of safety;Decreased awareness of deficits Awareness: Intellectual Problem Solving: Slow processing;Requires verbal cues;Requires tactile cues;Decreased initiation        Exercises      General Comments General comments (skin integrity, edema, etc.): pt is not able to assist well with sitting balance control and in chair was slumped and needed pillow assist to sit in chair      Pertinent Vitals/Pain Pain Assessment: Faces Faces Pain Scale: No hurt    Home Living                      Prior Function            PT Goals (current goals can now be found in the care plan section) Acute Rehab PT Goals Patient Stated Goal: continues to plan for home  Progress towards PT goals: Progressing toward goals    Frequency    Min 3X/week      PT Plan Current plan remains appropriate    Co-evaluation              AM-PAC PT "6 Clicks" Mobility   Outcome Measure  Help needed turning from your back to your side while in a flat bed without using bedrails?: A Lot Help needed moving from lying on your back to sitting on the side of a flat bed without using bedrails?: A Lot Help needed moving to and from a bed to a chair (including a wheelchair)?: Total Help needed standing up from a chair using your arms (e.g., wheelchair or bedside chair)?:  Total Help needed to walk in hospital room?: Total Help needed climbing 3-5 steps with a railing? : Total 6 Click Score: 8    End of Session Equipment Utilized During Treatment: Other (comment) (direct assist due to new G tube) Activity Tolerance: Patient limited by fatigue Patient left: in chair;with call bell/phone within reach;with chair alarm set;with family/visitor present Nurse Communication: Mobility status PT Visit Diagnosis: Unsteadiness on feet (R26.81);Difficulty in walking, not elsewhere classified (R26.2);Other symptoms and signs involving the nervous system (R29.898);Hemiplegia and hemiparesis;Other abnormalities of gait and mobility (R26.89);Muscle weakness (generalized) (M62.81) Hemiplegia - Right/Left: Left Hemiplegia - dominant/non-dominant: Non-dominant Hemiplegia - caused by: Cerebral infarction     Time: 9678-9381 PT Time Calculation (min) (ACUTE ONLY): 34 min  Charges:  $Therapeutic Activity: 23-37 mins                  Ivar Drape 02/03/2020, 2:52 PM  Samul Dada, PT MS Acute Rehab Dept. Number: [facility] [id number] and [facility] [phone]

## 2020-02-04 ENCOUNTER — Other Ambulatory Visit: Payer: Self-pay | Admitting: Cardiology

## 2020-02-04 DIAGNOSIS — I6319 Cerebral infarction due to embolism of other precerebral artery: Secondary | ICD-10-CM

## 2020-02-04 LAB — GLUCOSE, CAPILLARY
Glucose-Capillary: 94 mg/dL (ref 70–99)
Glucose-Capillary: 164 mg/dL — ABNORMAL HIGH (ref 70–99)

## 2020-02-04 LAB — CBC
HCT: 31.2 % — ABNORMAL LOW (ref 36.0–46.0)
Hemoglobin: 9.9 g/dL — ABNORMAL LOW (ref 12.0–15.0)
MCH: 30 pg (ref 26.0–34.0)
MCHC: 31.7 g/dL (ref 30.0–36.0)
MCV: 94.5 fL (ref 80.0–100.0)
Platelets: 339 10*3/uL (ref 150–400)
RBC: 3.3 MIL/uL — ABNORMAL LOW (ref 3.87–5.11)
RDW: 14 % (ref 11.5–15.5)
WBC: 4.5 10*3/uL (ref 4.0–10.5)
nRBC: 0 % (ref 0.0–0.2)

## 2020-02-04 LAB — COMPREHENSIVE METABOLIC PANEL
ALT: 15 U/L (ref 0–44)
AST: 27 U/L (ref 15–41)
Albumin: 2.6 g/dL — ABNORMAL LOW (ref 3.5–5.0)
Alkaline Phosphatase: 92 U/L (ref 38–126)
Anion gap: 8 (ref 5–15)
BUN: 17 mg/dL (ref 8–23)
CO2: 25 mmol/L (ref 22–32)
Calcium: 9.6 mg/dL (ref 8.9–10.3)
Chloride: 107 mmol/L (ref 98–111)
Creatinine, Ser: 0.72 mg/dL (ref 0.44–1.00)
GFR calc Af Amer: >60 mL/min (ref >60)
GFR calc non Af Amer: >60 mL/min (ref >60)
Glucose, Bld: 112 mg/dL — ABNORMAL HIGH (ref 70–99)
Potassium: 4 mmol/L (ref 3.5–5.1)
Sodium: 140 mmol/L (ref 135–145)
Total Bilirubin: 0.4 mg/dL (ref 0.3–1.2)
Total Protein: 5.2 g/dL — ABNORMAL LOW (ref 6.5–8.1)

## 2020-02-04 MED ORDER — DOCUSATE SODIUM 50 MG/5ML PO LIQD: 50.0000 mg | Freq: Every evening | ORAL | Status: DC | PRN

## 2020-02-04 MED ORDER — SENNOSIDES 8.8 MG/5ML PO SYRP
5.0000 mL | ORAL_SOLUTION | Freq: Every day | ORAL | Status: DC | PRN
  Administered 2020-02-04: 5 mL
  Filled 2020-02-04 (×2): qty 5

## 2020-02-04 NOTE — Progress Notes (Signed)
Patient seen and examined, she is alert and conversant, left facial drop. Daughter at bedside feeding patient.  Patient denies abdominal pain. Abdomen is soft. tolerating tube feeding.  For further detail see Discharge summary done by Dr Rito Ehrlich.  Patient stable for discharge.   Hartley Barefoot, MD.

## 2020-02-04 NOTE — TOC Transition Note (Signed)
Transition of Care [facility]) - CM/SW Discharge Note   Patient Details  Name: Sydney Schultz MRN: [id number] Date of Birth: 04-20-1932  [facility] CM/SW Contact:  Baldemar Lenis, LCSW Phone Number: 02/04/2020, 10:31 AM   Clinical Narrative:   Nurse to call report to [phone], Room 502    Final next level of care: Skilled Nursing Facility Barriers to Discharge: Barriers Resolved   Patient Goals and CMS Choice Patient states their goals for this hospitalization and ongoing recovery are:: patient unable to participate in goal setting due to disorientation CMS Medicare.gov Compare Post Acute Care list provided to:: Patient Represenative (must comment) Choice offered to / list presented to : Adult Children  Discharge Placement              Patient chooses bed at: [facility] Patient to be transferred to facility by: [facility] Name of family member notified: Lockie Pares Patient and family notified of of transfer: 02/04/20  Discharge Plan and Services                                     Social Determinants of Health (SDOH) Interventions     Readmission Risk Interventions No flowsheet data found.

## 2020-02-04 NOTE — Progress Notes (Signed)
Patient's daughter refused her mom to be turned, saying "Mama is comfortable, I have put pillows on her side". Nursing staff will keep monitoring.

## 2020-02-04 NOTE — Progress Notes (Signed)
Patient being discharged to [facility]. Report given to Tamala Ser, LPN CCMD notified. Pt daughter aware. All belongings with patient. Leaving unit via [facility].

## 2020-02-08 ENCOUNTER — Telehealth: Payer: Self-pay | Admitting: *Deleted

## 2020-02-08 ENCOUNTER — Encounter: Payer: Self-pay | Admitting: *Deleted

## 2020-02-08 NOTE — Telephone Encounter (Signed)
Patient enrolled for [employer] to ship a 30 day cardiac event monitor to [facility] Attn: Morrie Sheldon.  Discussed with patients nurse, Morrie Sheldon. Letter with instructions will be mailed to [facility], Attn: [place].

## 2020-02-18 ENCOUNTER — Ambulatory Visit (INDEPENDENT_AMBULATORY_CARE_PROVIDER_SITE_OTHER): Payer: Medicare PPO

## 2020-02-18 DIAGNOSIS — R001 Bradycardia, unspecified: Secondary | ICD-10-CM

## 2020-02-18 DIAGNOSIS — I4891 Unspecified atrial fibrillation: Secondary | ICD-10-CM

## 2020-02-18 DIAGNOSIS — I639 Cerebral infarction, unspecified: Secondary | ICD-10-CM

## 2020-03-09 ENCOUNTER — Other Ambulatory Visit: Payer: Self-pay | Admitting: Cardiology

## 2020-03-09 DIAGNOSIS — I4891 Unspecified atrial fibrillation: Secondary | ICD-10-CM

## 2020-03-09 DIAGNOSIS — I639 Cerebral infarction, unspecified: Secondary | ICD-10-CM

## 2020-04-12 DEATH — deceased

## 2020-04-26 ENCOUNTER — Telehealth: Payer: Self-pay

## 2020-04-26 NOTE — Telephone Encounter (Signed)
-----   Message from Arty Baumgartner, NP sent at 04/26/2020  3:20 PM EST ----- Please inform the patient that her heart monitor showed no overly concerning arrhythmias. Thanks

## 2020-04-26 NOTE — Telephone Encounter (Signed)
Attempted to call the patients monitor results. Patients daughter states that the patient passed away. Will route to med rec for follow up.

## 2021-04-05 IMAGING — DX DG CHEST 1V PORT
2 series · 2 of 2 positions shown · non-contrast
Comparison: 01/16/2020.

CLINICAL DATA: Cough.

EXAM:
PORTABLE CHEST 1 VIEW

[chest ap (1 of 2)]
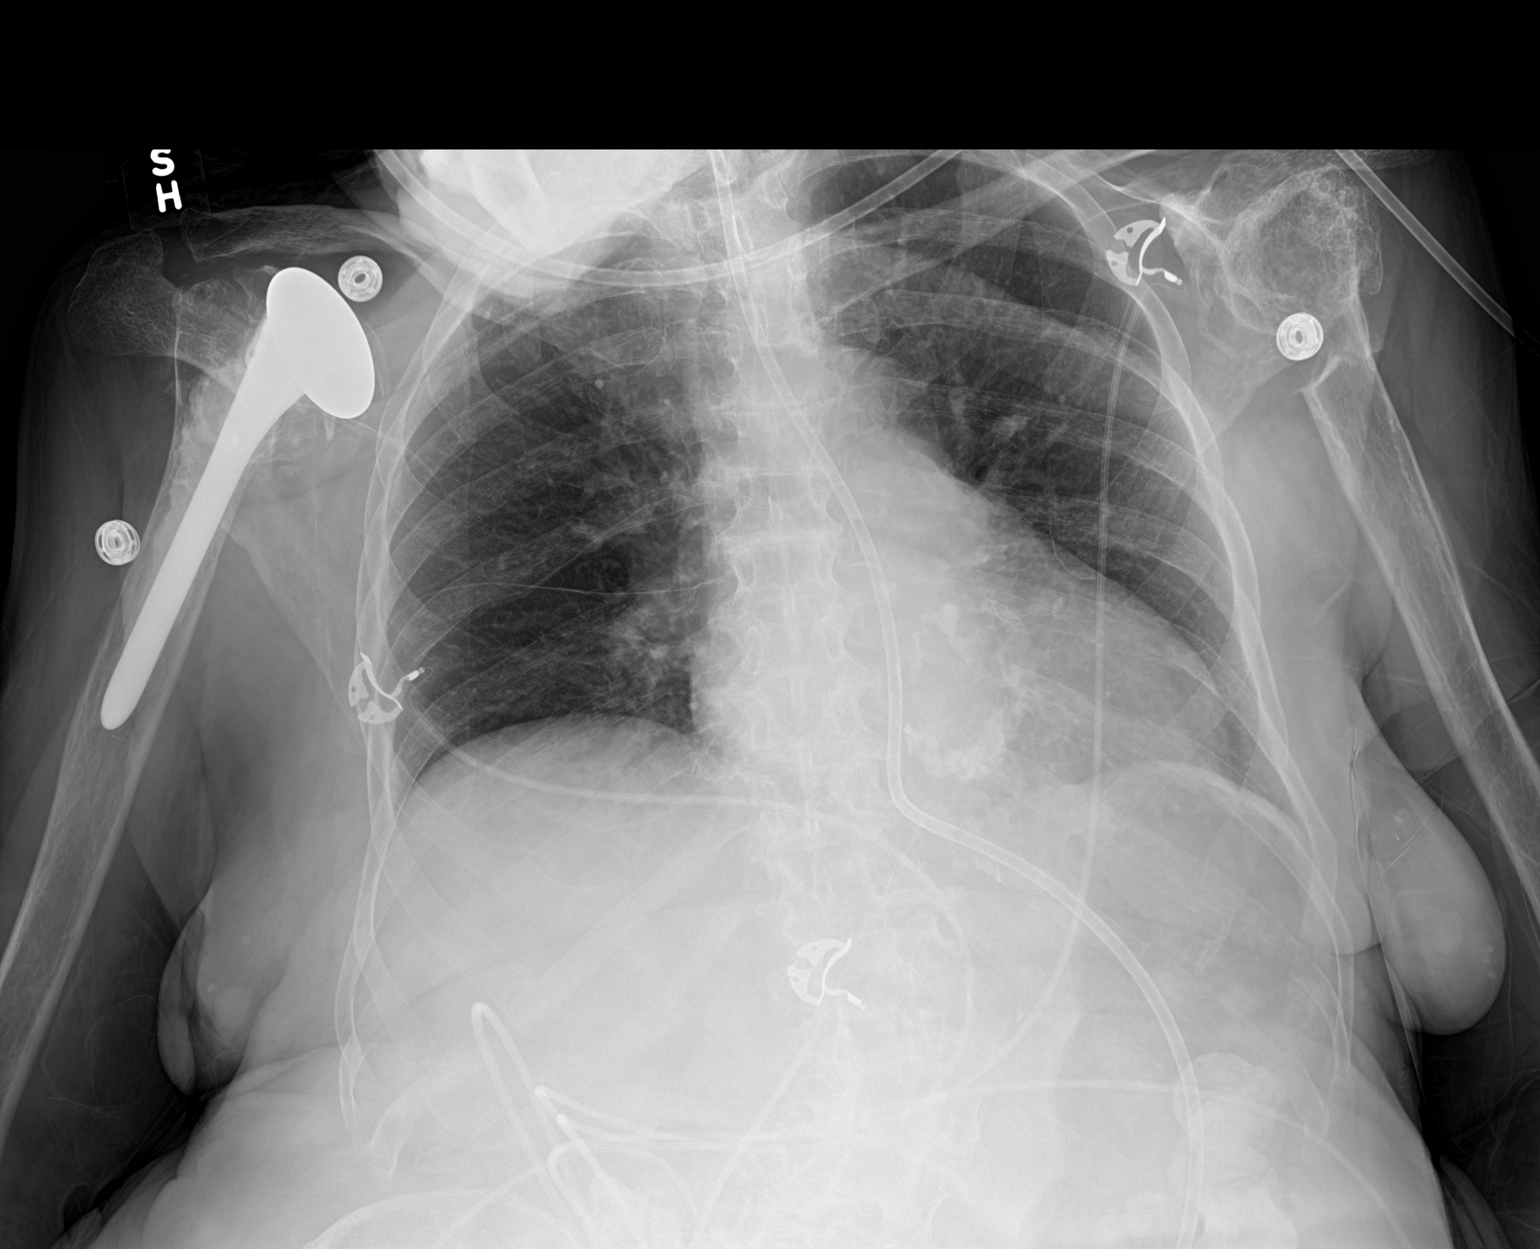

[chest ap (2 of 2)]
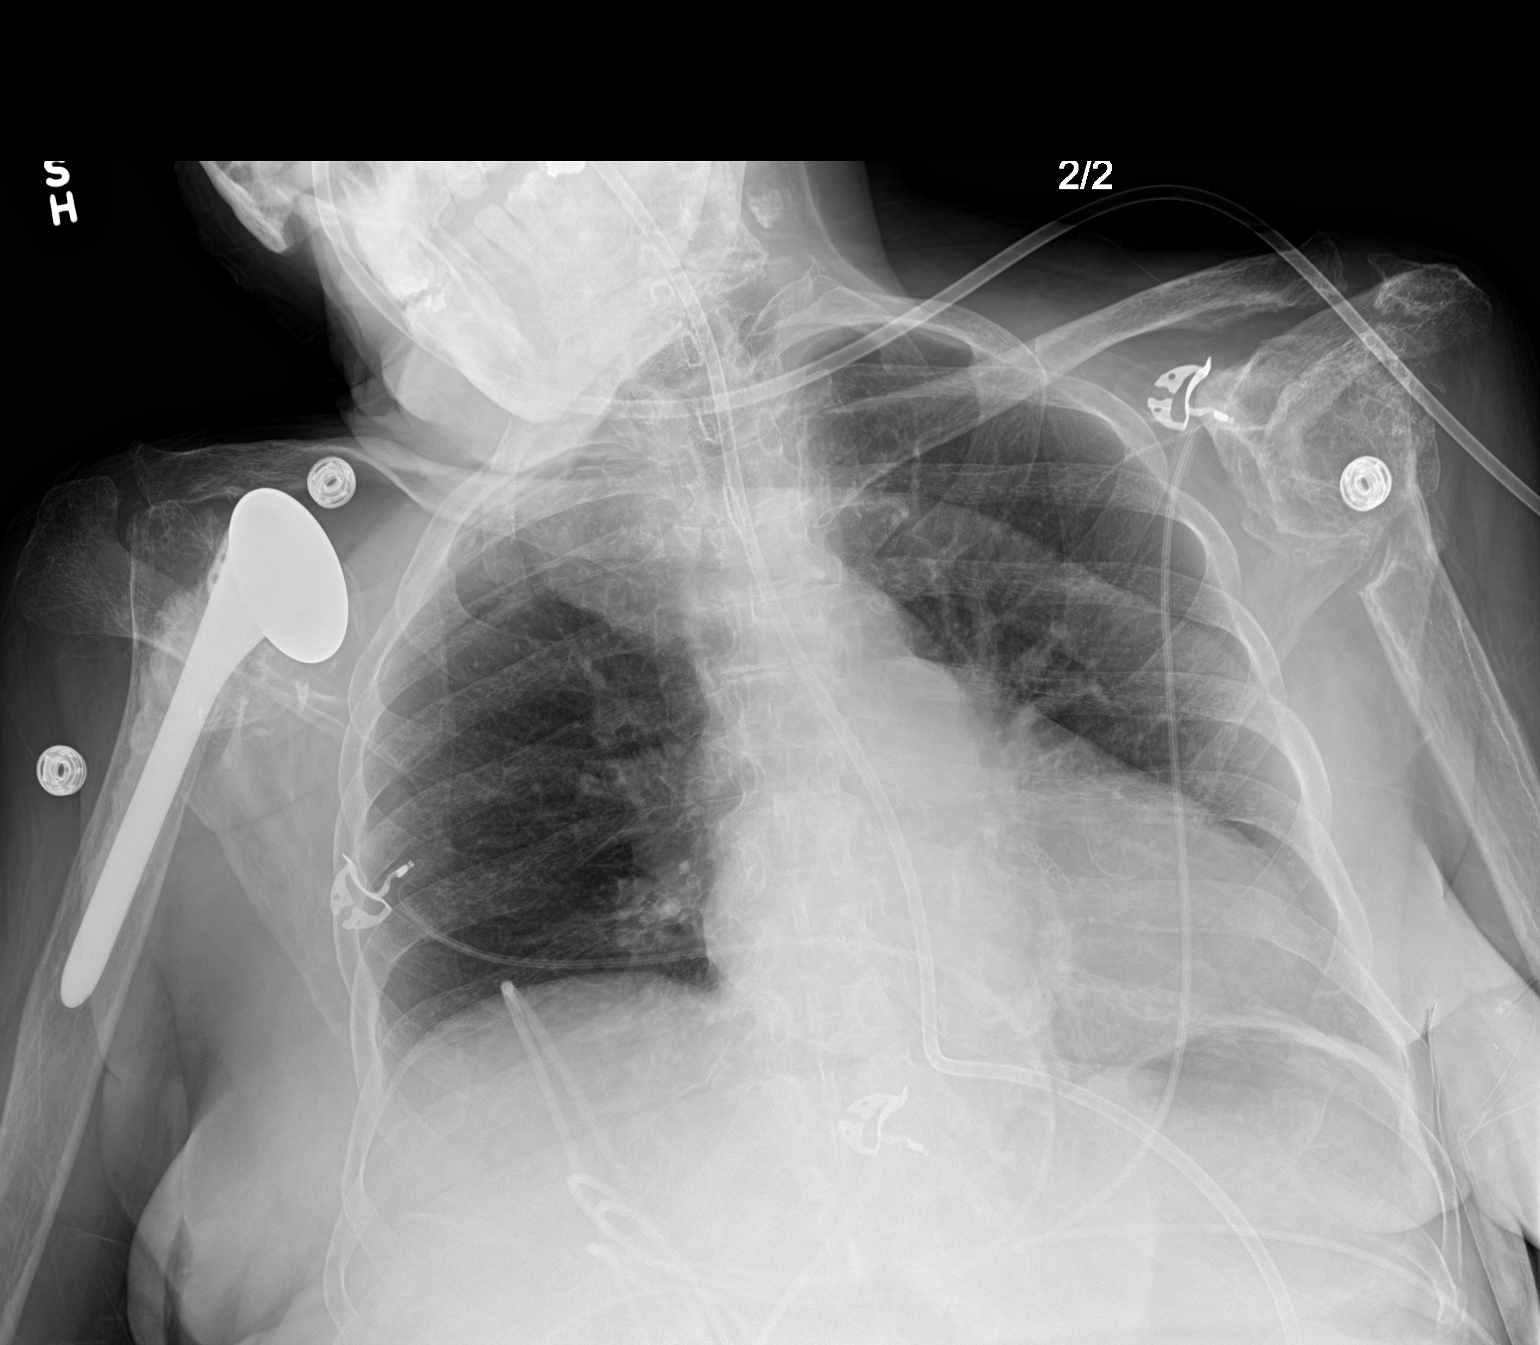

[2 of 2 positions shown; findings below may reference images not displayed]

FINDINGS: Feeding tube noted with tip below left hemidiaphragm. Mediastinum
and hilar structures normal. Low lung volumes. No focal infiltrate
identified. No pleural effusion or pneumothorax. Heart size stable.
Mitral annular calcification noted. Coronary stent noted. Carotid
calcification noted. Postsurgical changes right shoulder. Stable
deformity and severe degenerative changes left shoulder.
IMPRESSION: 1.  Feeding tube noted with tip below left hemidiaphragm.

2.  Low lung volumes.  No focal infiltrate identified.

3. Coronary stent noted. Carotid vascular calcification consistent
with atherosclerotic vascular disease noted.
